# Patient Record
Sex: Female | Born: 1980 | Race: Black or African American | Hispanic: No | Marital: Single | Smoking: Former smoker
Health system: Southern US, Community
[De-identification: ages and names within clinical notes are randomized; demographics above are authoritative.]

## PROBLEM LIST (undated history)

## (undated) DIAGNOSIS — G43909 Migraine, unspecified, not intractable, without status migrainosus: Secondary | ICD-10-CM

## (undated) DIAGNOSIS — G932 Benign intracranial hypertension: Secondary | ICD-10-CM

## (undated) DIAGNOSIS — G510 Bell's palsy: Secondary | ICD-10-CM

## (undated) DIAGNOSIS — E119 Type 2 diabetes mellitus without complications: Secondary | ICD-10-CM

## (undated) DIAGNOSIS — I10 Essential (primary) hypertension: Secondary | ICD-10-CM

## (undated) DIAGNOSIS — R739 Hyperglycemia, unspecified: Secondary | ICD-10-CM

## (undated) DIAGNOSIS — G4730 Sleep apnea, unspecified: Secondary | ICD-10-CM

## (undated) HISTORY — DX: Migraine, unspecified, not intractable, without status migrainosus: G43.909

## (undated) HISTORY — PX: TUBAL LIGATION: SHX77

---

## 2000-09-05 ENCOUNTER — Other Ambulatory Visit: Admission: RE | Admit: 2000-09-05 | Discharge: 2000-09-05 | Payer: Self-pay | Admitting: Obstetrics and Gynecology

## 2000-09-29 ENCOUNTER — Encounter: Payer: Self-pay | Admitting: Obstetrics and Gynecology

## 2000-09-29 ENCOUNTER — Ambulatory Visit (HOSPITAL_COMMUNITY): Admission: RE | Admit: 2000-09-29 | Discharge: 2000-09-29 | Payer: Self-pay | Admitting: Obstetrics and Gynecology

## 2000-11-10 ENCOUNTER — Ambulatory Visit (HOSPITAL_COMMUNITY): Admission: RE | Admit: 2000-11-10 | Discharge: 2000-11-10 | Payer: Self-pay | Admitting: Obstetrics and Gynecology

## 2000-11-10 ENCOUNTER — Encounter: Payer: Self-pay | Admitting: Obstetrics and Gynecology

## 2000-11-14 ENCOUNTER — Emergency Department (HOSPITAL_COMMUNITY): Admission: EM | Admit: 2000-11-14 | Discharge: 2000-11-14 | Payer: Self-pay | Admitting: Emergency Medicine

## 2001-02-24 ENCOUNTER — Inpatient Hospital Stay (HOSPITAL_COMMUNITY): Admission: AD | Admit: 2001-02-24 | Discharge: 2001-02-28 | Payer: Self-pay | Admitting: *Deleted

## 2002-06-08 ENCOUNTER — Emergency Department (HOSPITAL_COMMUNITY): Admission: EM | Admit: 2002-06-08 | Discharge: 2002-06-08 | Payer: Self-pay | Admitting: Emergency Medicine

## 2002-06-08 ENCOUNTER — Encounter: Payer: Self-pay | Admitting: Emergency Medicine

## 2002-11-12 ENCOUNTER — Other Ambulatory Visit: Admission: RE | Admit: 2002-11-12 | Discharge: 2002-11-12 | Payer: Self-pay | Admitting: *Deleted

## 2003-02-11 ENCOUNTER — Encounter: Payer: Self-pay | Admitting: Obstetrics and Gynecology

## 2003-02-11 ENCOUNTER — Ambulatory Visit (HOSPITAL_COMMUNITY): Admission: RE | Admit: 2003-02-11 | Discharge: 2003-02-11 | Payer: Self-pay | Admitting: Obstetrics and Gynecology

## 2003-02-21 ENCOUNTER — Inpatient Hospital Stay (HOSPITAL_COMMUNITY): Admission: AD | Admit: 2003-02-21 | Discharge: 2003-02-22 | Payer: Self-pay | Admitting: Obstetrics and Gynecology

## 2003-03-19 ENCOUNTER — Inpatient Hospital Stay (HOSPITAL_COMMUNITY): Admission: AD | Admit: 2003-03-19 | Discharge: 2003-03-19 | Payer: Self-pay | Admitting: Obstetrics and Gynecology

## 2003-03-20 ENCOUNTER — Inpatient Hospital Stay (HOSPITAL_COMMUNITY): Admission: AD | Admit: 2003-03-20 | Discharge: 2003-03-21 | Payer: Self-pay | Admitting: Obstetrics and Gynecology

## 2003-03-22 ENCOUNTER — Inpatient Hospital Stay (HOSPITAL_COMMUNITY): Admission: AD | Admit: 2003-03-22 | Discharge: 2003-03-22 | Payer: Self-pay | Admitting: Obstetrics and Gynecology

## 2003-05-22 ENCOUNTER — Encounter (INDEPENDENT_AMBULATORY_CARE_PROVIDER_SITE_OTHER): Payer: Self-pay

## 2003-05-22 ENCOUNTER — Inpatient Hospital Stay (HOSPITAL_COMMUNITY): Admission: RE | Admit: 2003-05-22 | Discharge: 2003-05-25 | Payer: Self-pay | Admitting: Obstetrics and Gynecology

## 2003-06-19 ENCOUNTER — Inpatient Hospital Stay (HOSPITAL_COMMUNITY): Admission: AD | Admit: 2003-06-19 | Discharge: 2003-06-19 | Payer: Self-pay | Admitting: Obstetrics and Gynecology

## 2003-06-24 ENCOUNTER — Inpatient Hospital Stay (HOSPITAL_COMMUNITY): Admission: AD | Admit: 2003-06-24 | Discharge: 2003-06-24 | Payer: Self-pay | Admitting: Obstetrics and Gynecology

## 2006-03-02 ENCOUNTER — Emergency Department (HOSPITAL_COMMUNITY): Admission: EM | Admit: 2006-03-02 | Discharge: 2006-03-02 | Payer: Self-pay | Admitting: Emergency Medicine

## 2008-01-01 ENCOUNTER — Emergency Department (HOSPITAL_COMMUNITY): Admission: EM | Admit: 2008-01-01 | Discharge: 2008-01-01 | Payer: Self-pay | Admitting: Emergency Medicine

## 2010-10-22 NOTE — Discharge Summary (Signed)
NAMECRUCITA, [name]                    ACCOUNT NO.:  [id number]   MEDICAL RECORD NO.:  [id number]                   PATIENT TYPE:  INP   LOCATION:  9117                                 FACILITY:  [facility]   PHYSICIAN:  Naima A. Dillard, M.D.              DATE OF BIRTH:  [birth year]   DATE OF ADMISSION:  05/22/2003  DATE OF DISCHARGE:  05/25/2003                                 DISCHARGE SUMMARY   ADMISSION DIAGNOSES:  1. Intrauterine pregnancy  at term  2. Prior cesarean section.  3. Desires repeat cesarean section.  4. Desires sterilization.   DISCHARGE DIAGNOSES:  1. Intrauterine pregnancy  at term.  2. Prior cesarean section.  3. Desires repeat cesarean section.  4. Desires sterilization.   PROCEDURE:  1. Repeat low transverse cesarean section.  2. Bilateral tubal ligation.   HOSPITAL COURSE:  Ms. Linda Thomas is a 30 year old female gravida 2, para 0-1-  0-1 who presents having had a prior cesarean section. She desires a repeat  cesarean section as well as sterilization. Her pregnancy has been followed  by the [facility] and [facility]necologic [facility] Certified Nurse  Midwife Services and has been remarkable for:  1. First trimester bleeding.  2. Previous cesarean section.  3. Preterm delivery at 36 weeks.  4. Planned bilateral tubal ligation.  5. Group B strep positive.  Her cesarean section procedure went well and there was delivery of a viable  female infant weighing 6 pounds and 3 ounces named Linda Thomas. Her Apgar's were  9 at 1 minute and 9 at 5 minutes. She was taken to the full term nursery in  good condition. The patient tolerated the cesarean section as well as the  bilateral tubal ligation well and was taken to recovery room in good  condition. By postoperative day 1, she was doing well.  Her hemoglobin was  9.5 and had been 11.5 preoperatively. She was ambulating, voiding and eating  without difficulty. She was bottle feeding her infant. She had a  temperature  max of 101.3 in the middle of the night after her delivery but that resolved  to a normal level and remained within normal limits. Her other vital signs  were stable. By postoperative day 2, she continued to do well and by  postoperative day 3, she was deemed to have received the full benefit of her  hospital stay and was discharged to home.   DISCHARGE INSTRUCTIONS:  Per [facility] and Gynecologic  Services handout.   DISCHARGE MEDICATIONS:  1. Tylox 1 to 2 p.o. q. 3 to 4 hours p.r.n. pain.  2. Motrin 600 mg 1 p.o. q. 6 hours p.r.n. pain.   FOLLOW UP:  Will occur at [facility] and Gynecologic  Services in approximately 6 weeks or as needed.     Cam Hai, C.N.M.  Naima A. Normand Sloop, M.D.    KS/MEDQ  D:  05/25/2003  T:  05/25/2003  Job:  [id number]

## 2010-10-22 NOTE — Discharge Summary (Signed)
[facility]  Patient:    Linda Thomas, Linda Thomas Visit Number: [id number] MRN: [id number]          Service Type: OBS Location: 910A 9129 01 Attending Physician:  Leonard Schwartz Dictated by:   Mack Guise, C.N.M. Admit Date:  02/24/2001 Discharge Date: 02/28/2001                             Discharge Summary  ADMITTING DIAGNOSES:          1. Intrauterine pregnancy at 36 weeks.                               2. Active labor.                               3. Breech.  PROCEDURE:                    Primary low transverse cesarean section.  DISCHARGE DIAGNOSES:          1. Intrauterine pregnancy at 36 weeks.                               2. Active labor.                               3. Breech.                               4. Postpartum febrile morbidity.  HOSPITAL COURSE:              Ms. Cerveny is a 30 year old, gravida 1, para 0, who presented at 36 weeks in active labor. Upon rupture of membranes, she was found to be breech and underwent primary low transverse cesarean delivery by Dr. Marline Backbone with the birth of a 5-pound 5-ounce female infant named Trevcon with Apgar scores of 6 at one minute, 8 at five minutes. Her postoperative course has been complicated by an increased temperature. Her hemoglobin on the first postoperative day was 11.1. She was treated with Cefotan for that increased temperature and on September 25, she had been afebrile for 24 hours and was judged to be in satisfactory condition for discharge.  DISCHARGE INSTRUCTIONS:       Instructions are per [facility] handout.  DISCHARGE MEDICATIONS:        1. Motrin 600 mg p.o. q.6h. p.r.n. pain.                               2. Tylox one to two p.o. q.3-4h. p.r.n. pain.                               3. Prenatal vitamins.  DISCHARGE FOLLOWUP:           The patient is to follow up at [facility] in six weeks. Dictated by:   Mack Guise, C.N.M. Attending  Physician:  Leonard Schwartz DD:  02/28/01 TD:  02/28/01 Job: [id number]

## 2010-10-22 NOTE — Op Note (Signed)
Linda Thomas, Linda Thomas                    ACCOUNT NO.:  [id number]   MEDICAL RECORD NO.:  [id number]                   PATIENT TYPE:  INP   LOCATION:  9198                                 FACILITY:  [facility]   PHYSICIAN:  Janine Limbo, M.D.            DATE OF BIRTH:  14-Jan-1981   DATE OF PROCEDURE:  05/22/2003  DATE OF DISCHARGE:                                 OPERATIVE REPORT   PREOPERATIVE DIAGNOSES:  1. Term intrauterine pregnancy.  2. Prior cesarean section.  3. Desire repeat cesarean section.  4. Desires sterilization.   POSTOPERATIVE DIAGNOSES:  1. Term intrauterine pregnancy.  2. Prior cesarean section.  3. Desire repeat cesarean section.  4. Desires sterilization.  5. Pelvic adhesions.   PROCEDURES:  1. Repeat low transverse cesarean section.  2. Bilateral tubal ligation.   SURGEON:  Janine Limbo, M.D.   FIRST ASSISTANT:  Marie L. Williams, C.N.M.   ANESTHESIA:  Spinal.   DISPOSITION:  Linda Thomas is a 30 year old female, gravida 2, para 0-1-0-  1, who presents having  had a prior cesarean section.  She desires repeat  cesarean section.  She also desires sterilization.  She understands the  indications for her procedure and she accepts the risks of, but not limited  to, anesthetic complications, bleeding, infections, possible damage to the  surrounding organs, and possible tubal failure (17 per 1000).   FINDINGS:  A 6 pound 3 ounce female infant Science writer) was delivered.  The  Apgars were 9 at one minute and 9 at five minutes.  The fallopian tubes and  the ovaries appeared normal.  There were dense adhesions between the fascia,  the abdominal musculature, the anterior peritoneum, and the lower uterine  segment.   DESCRIPTION OF PROCEDURE:  The patient was taken to the operating room,  where a spinal anesthetic was given.  The patient's abdomen and perineum  were prepped with multiple layers of Betadine.  A Foley catheter was placed  in the  bladder.  The patient was sterilely draped.  The lower abdominal  incision was injected with 20 mL of 0.5% Marcaine with epinephrine.  A low  transverse incision was made and the incision was extended sharply through  the subcutaneous tissue, the fascia, and the anterior peritoneum.  There  were dense adhesions encountered and an extensive lysis of adhesions was  required before we could get to the lower uterine segment.  Additionally,  there were adhesions between the omentum, the peritoneum, and the lower  uterine segment.  These were carefully removed.  The bladder flap was  developed.  An incision was made in the lower uterine segment and extended  transversely.  The fetal head was delivered without difficulty.  The mouth  and nose were suctioned.  The remainder of the infant was delivered.  The  cord was clamped and cut.  The infant was handed to the awaiting pediatric  team.  Routine cord blood studies were obtained.  The placenta was removed.  The uterine cavity was cleaned of amniotic fluid, clotted blood, and  membranes.  The uterine incision was closed using a running locking suture  of 2-0 Vicryl.  Hemostasis was adequate.  The pelvis was vigorously  irrigated.  The left fallopian tube was identified and followed to its  fimbriated end.  A knuckle of tube was made on the left using a free tie and  then a suture ligature of 0 plain catgut.  The knuckle of tube thus made was  excised.  Hemostasis was adequate.  An identical procedure was carried out  on the opposite side.  Again hemostasis was adequate.  The pelvis was  irrigated once again.  The anterior peritoneum and the abdominal musculature  were reapproximated in the midline using 2-0 Vicryl.  The fascia was closed  using a running suture of 0 Vicryl followed by three interrupted sutures of  0 Vicryl.  The subcutaneous layer was once again irrigated.  Hemostasis was  adequate.  The subcutaneous layer was closed using a running  suture of 2-0  Vicryl.  The skin was reapproximated using a subcuticular suture of 4-0  Vicryl.  Sponge, needle, and instrument counts were correct on two  occasions.  The estimated blood loss was 800 mL.  The patient had tolerated  her procedure well.  The patient was taken to the recovery room in stable  condition.  She was noted to drain clear yellow urine.  The infant was taken  to the full-term nursery in stable condition.                                               Janine Limbo, M.D.    AVS/MEDQ  D:  05/22/2003  T:  05/22/2003  Job:  [id number]

## 2010-10-22 NOTE — H&P (Signed)
NAMEMarland Kitchen  Thomas, Linda                    ACCOUNT NO.:  [id number]   MEDICAL RECORD NO.:  [id number]                   PATIENT TYPE:  INP   LOCATION:  9151                                 [facility]ACILITY[facility]   PHYSICIAN:  Hal Morales, M.D.             DATE OF BIRTH:  April 05, 1981   DATE OF ADMISSION:  03/20/2003  DATE OF DISCHARGE:                                HISTORY & PHYSICAL   Linda Thomas is a 30 year old gravida 1, para 0-1-0-1, at 28-3/7 weeks by  six-week ultrasound, who presented to the hospital for re-evaluation  secondary to a positive fetal fibronectin and a positive Chlamydia from a  visit on March 19, 2003.  Linda Thomas was seen in the office yesterday  for a small amount of bleeding.  Her cervix was noted to be external os 2  cm, internal os fingertip.  She was seen at maternity admissions after  having cultures done and a beta strep test, and she was sent home after a  period of monitoring.  Fetal fibronectin and cultures had been done at the  office.  The patient was notified today that her fetal fibronectin and  Chlamydia were positive.  She was therefore directed to come back to  maternity admissions for betamethasone and further re-evaluation.  Upon  presentation in maternity admissions, the patient was noted to have mild  irritability and some occasional contractions.  The decision was made to  admit her for 23-hour observation to complete her betamethasone course and  continuous monitoring.  Pregnancy is remarkable for:   1. Previous primary low transverse cesarean section secondary to labor at 36     weeks with a breech presentation.  2. Desires repeat C-section and tubal ligation.  3. First trimester bleeding.  4. Positive fetal fibronectin now.  5. Positive Chlamydia now.    PRENATAL LABORATORY DATA:  Blood type is B positive, Rh antibody negative.  VDRL nonreactive.  Rubella titer positive.  Hepatitis B surface antigen  negative.  HIV  nonreactive.  Sickle cell test negative.  GC and Chlamydia  cultures from May were noted in the chart.  Chlamydia was noted to be  positive, although the patient did not receive treatment.  Cystic fibrosis  testing was negative.  Hemoglobin upon entering the practice was 11.8.  At  27 weeks it was 10.7.  Glucola was normal.  EDC of June 07, 2003, was  established by six-week ultrasound and was in agreement with ultrasound at  approximately 24 weeks.  Cervix at 24 weeks was 3.4 cm long.   HISTORY OF PRESENT PREGNANCY:  The patient entered care at approximately 10  weeks.  She had an ultrasound at 10 weeks to show positive cardiac activity,  and the cervix was 3.2 cm.  Her cervix was closed at 12 weeks.  She was  planning to have her tubes tied, and papers were signed in July.  She had  another ultrasound at  19 weeks with an echogenic intracardiac focus noted.  This was followed up at 23 weeks with the same finding but normal growth and  development.  The patient was placed on iron at 27 weeks.  She reported  bright red bleeding the morning of March 19, 2003, and was seen at the  office where cultures were done, cervix was fingertip internal os, 2 cm  external os, 50%, with the presenting part at a -2.  The patient's blood  type was B positive.  She was sent to maternity admissions on that day for  monitoring and tocolysis p.r.n., but she was not given any at that time.   PAST OBSTETRICAL HISTORY:  In 2002 the patient had a primary low transverse  cesarean section of a female infant, weight 5 pounds 5 ounces, at 36 weeks.  She did go into labor at that time and was found to be breech.  She had no  other pregnancy complications.  That infant did well.   PAST MEDICAL HISTORY:  She had Chlamydia in 2002.   The patient has no known medication allergies.   FAMILY HISTORY:  Her maternal grandfather has heart disease and the maternal  grandmother, maternal grandfather, and mother have  hypertension.  Her sister  has anemia.  Her maternal grandfather has diabetes.  Paternal grandmother  has a stroke.  Someone in her family had rheumatoid arthritis.  Mother,  father, and brother are tobacco users.   GENETIC HISTORY:  Remarkable for the patient's sister and her brother having  sickle cell trait.   SOCIAL HISTORY:  The patient is single.  Father of the baby is involved and  supportive.  The patient denies any alcohol, drug, or tobacco use during  this pregnancy.  The patient has been followed by the certified nurse  midwife service at [facility].   PHYSICAL EXAMINATION:  VITAL SIGNS:  Stable, the patient is afebrile.  HEENT:  Within normal limits.  CHEST:  Bilateral breath sounds are clear.  CARDIAC:  Regular rate and rhythm without murmur.  BREASTS:  Soft and nontender.  ABDOMEN:  Fundal height is approximately 28 cm.  Fetus is vertex to bedside  ultrasound.  PELVIC:  Cervix is external os 2 cm, internal os fingertip, 50%, with the  presenting part out of the pelvis on digital exam.  MONITORING:  Fetal heart rate is reactive.  In the initial two hours of  monitor tracing, the patient had some occasional irritability and two or  three contractions in that amount of time.  EXTREMITIES:  Deep tendon reflexes are 2+ without clonus.  There is a trace  edema noted.   IMPRESSION:  1. Intrauterine pregnancy at 28-3/7 weeks.  2. Positive fetal fibronectin.  3. Positive Chlamydia.   PLAN:  1. Admit to antenatal per consult with Hal Morales, M.D., as attending     physician.  2. Continuous electronic fetal monitoring.  3. Complete betamethasone course with first dose given at approximately 1     p.m. on today and a second dose to be given at approximately 1 p.m. on     October 15.  4. The patient also received Zithromax 1 g for treatment of Chlamydia.  Will     plan test of cure in three weeks. 5. Terbutaline or other tocolysis for more than six  contractions in an hour.     Renaldo Reel Linda Thomas, C.N.M.  Hal Morales, M.D.    VLL/MEDQ  D:  03/20/2003  T:  03/20/2003  Job:  [id number]

## 2010-10-22 NOTE — Discharge Summary (Signed)
NAMEJANAYSIA, Linda Thomas                    ACCOUNT NO.:  [id number]   MEDICAL RECORD NO.:  [id number]                   PATIENT TYPE:  INP   LOCATION:  9151                                 [facility]ILITY[facility]   PHYSICIAN:  Hal Morales, M.D.             DATE OF BIRTH:  07-Nov-1980   DATE OF ADMISSION:  03/20/2003  DATE OF DISCHARGE:  03/21/2003                                 DISCHARGE SUMMARY   ADMISSION DIAGNOSES:  1. Intrauterine pregnancy at 56 and 3/7ths weeks.  2. Positive Chlamydia.  3. Positive fetal fibronectin.  4. Slight cervical change.   DISCHARGE DIAGNOSIS:  She is leaving the hospital against medical advice.   PROCEDURES THIS ADMISSION:  None.   HOSPITAL COURSE:  [name] is a 30 year old single black female,  gravida 2, para 0-1-0-1 at 66 and 3/7ths weeks who presented to maternity  admissions on March 20, 2003 for betamethasone treatment secondary to  having a documented positive fetal fibronectin on March 19, 2003, and also  being positive for Chlamydia from exam on March 19, 2003.  She did receive  a dose of betamethasone at approximately 1 p.m. on March 20, 2003, and  again on March 21, 2003.  She received 1 gm of Zithromax p.o. on March 20, 2003.  She has not been noted to have any regular uterine contractions  on the monitor, but does report a sense of pressure and some low backache.  She was recommended at this point with the afternoon of March 21, 2003 it  was noted that her cervix had changed from fingertip internal os to 1.5 to 2  cm internal os, 50% vertex, at a -2.  Due to this cervical change with just  bed rest for treatment, it was recommended that she continue on straight bed  rest in the hospital and take ibuprofen 600 mg q.6h. around the clock.  When  the patient was informed of the recommendation that she be continued in the  hospital, and that medication be started, she stated that she wanted to sign  out of the  hospital against medical advice.  The risks of preterm delivery  were reviewed with her again; however, she verbalized that she understands  and accepts the risk of preterm delivery, but wants to leave anyway, and  that she will continue on bed rest at home and take Motrin 600 mg q.6h. at  home.  She also reports that she will return for a cervical exam tomorrow in  maternity admissions.   DISCHARGE INSTRUCTIONS:  1. Continue on straight bed rest; be up only for bathroom and eating.  2. Take ibuprofen 600 mg p.o. q.6h. around the clock.  3. Return for a cervical exam in maternity admissions tomorrow.     [name], C.N.M.              Hal Morales, M.D.    SJD/MEDQ  D:  03/21/2003  T:  03/21/2003  Job:  [id number]

## 2010-10-22 NOTE — Op Note (Signed)
[facility]  Patient:    Linda Thomas, Linda Thomas Visit Number: [id number] MRN: [id number]          Service Type: OBS Location: 910A 9129 01 Attending Physician:  Leonard Schwartz Dictated by:   Janine Limbo, M.D. Proc. Date: 02/24/01 Admit Date:  02/24/2001                             Operative Report  PREOPERATIVE DIAGNOSES:       1. Thirty-six weeks gestation.                               2. Breach presentation  POSTOPERATIVE DIAGNOSES:      1. Thirty-six weeks gestation.                               2. Homero Fellers breach presentation.  OPERATION:                    Primary low transverse cesarean section.  SURGEON:                      Janine Limbo, M.D.  FIRST ASSISTANT:              Wynelle Bourgeois, CNM  ANESTHESIA:                   Spinal.  DISPOSITION:                  The patient is a 30 year old female, gravida 1, para 0, who presents at [redacted] weeks gestation in active labor.  She was noted to be in a breach presentation.  The patient understands the indications for her procedure, and she accepts the risks of, but not limited to, anesthetic complications, bleeding, infections, and possible damage to the surrounding organs.  FINDINGS:                     A 5 pound 5 ounce female infant Robina Ade) was delivered from a frank breach presentation.  The Apgars were 6 at 1 minute and 8 at 5 minutes.  The uterus, fallopian tubes, and ovaries were normal for the gravid state.  DESCRIPTION OF PROCEDURE:     The patient was taken to the operating room where a spinal anesthetic was given.  The patients abdomen and perineum were prepped with multiple layers with Betadine.  A Foley catheter was placed in the bladder.  The patient was sterilely draped.  A low transverse incision was made in the abdomen and carried sharply through the subcutaneous tissue, the fascia, and the anterior peritoneum.  An incision was made in the lower uterine  segment and extended transversely.  The infant was delivered from a frank breach presentation without difficulty.  The cord was clamped and cut, and the infant was handed to the awaiting pediatric team.  Routine cord blood studies were obtained.  The placenta was removed.  The uterine  cavity was cleaned of amniotic fluid, clotted blood, and membranes.  The uterine incision was closed using a running locking suture of 2-0 Vicryl followed by figure-of-eight sutures of 2-0 Vicryl for hemostasis.  Hemostasis was noted to be adequate.  The pelvic cavity was vigorously irrigated.  The The anterior peritoneum and the abdominal musculature were reapproximated in  the midline using 2-0 Vicryl.  The fascia and the abdominal musculature were irrigated. Hemostasis was adequate.  The fascia was closed using a running suture of 0 Vicryl followed by three interrupted sutures of 0 Vicryl.  The subcutaneous layer was closed using 2-0 Vicryl.  The skin was reapproximated using skin staples.  Sponge, needle, and instrument counts were correct on two occasions. The estimated blood loss was 700 cc.  The patient tolerated her procedure well.   The patient was taken to the recovery room in stable condition. The infant was taken to the full-term nursery in stable condition. Dictated by:   Janine Limbo, M.D. Attending Physician:  Leonard Schwartz DD:  02/24/01 TD:  02/24/01 Job: [id number]

## 2010-10-22 NOTE — H&P (Signed)
[facility]  Patient:    Linda Thomas, Linda Thomas Visit Number: [id number] MRN: [id number]          Service Type: EMS Location: ED Attending Physician:  Donnetta Hutching Dictated by:   Wynelle Bourgeois, CNM Admit Date:  11/14/2000 Discharge Date: 11/14/2000                           History and Physical  HISTORY OF PRESENT ILLNESS:   This is a 30 year old G1, P0 at 36-0/7ths weeks, who presents with complaints of regular uterine contractions x 2 hours.  She reports a small amount of bleeding and positive fetal movement.  Her pregnancy has been followed by the nurse midwifery service and remarkable for: 1. First trimester bleeding.  2. Right ovarian cyst.  3. Rubella nonimmune. 4. Shortened cervix.  OBSTETRICAL HISTORY:          The patient is a primigravida.  PRENATAL LABORATORIES:        Hemoglobin 11.7, hematocrit 35.9, platelets 161. Blood type B positive.  Antibody screen negative.  Sickle cell negative.  RPR nonreactive.  Rubella nonimmune.  HBsAg negative.  HIV declined.  Pap test normal.  Gonorrhea negative.  Chlamydia initially positive with a negative test of _____.  Glucose challenge within normal limits.  Group B strep pending.  MEDICAL HISTORY:              Remarkable for a history of childhood varicella.  FAMILY HISTORY:               Remarkable for anemia in her sister; rheumatoid arthritis in her father; migraines in her sister; hypertension in her mother, grandmother and grandfather.  GENETIC HISTORY:              Remarkable for a sister and a brother with sickle cell trait.  SOCIAL HISTORY:               The patient is single.  The father of the baby is not involved.  She does not report a religious affiliation.  Denies any alcohol, tobacco or drug use.  PHYSICAL EXAMINATION:  VITAL SIGNS:                  Vital signs stable, afebrile.  HEENT:                        Within normal limits.  NECK:                         Thyroid  normal, not enlarged.  BREASTS:                      Soft, nontender, no masses.  CHEST:                        Clear to auscultation bilaterally.  HEART RATE:                   Regular rate and rhythm, no murmur.  ABDOMEN:                      Gravid, vertex to [employer].  EFM shows reactive fetal heart rate with uterine contractions every 2-3 minutes.  PELVIC:  Cervical exam is 7-8 cm, completely effaced, -1 station vertex with a bulging bag of water.  Positive bloody show.  EXTREMITIES:                  Within normal limits.  ASSESSMENT:                   1. Intrauterine pregnancy at 36 weeks.                               2. Active labor.  PLAN:                         1. Admit to birthing suite, Dr. Stefano Gaul                                  notified.                               2. Routine CNM orders.                               3. Group B strep prophylaxis. Dictated by:   Wynelle Bourgeois, CNM Attending Physician:  Donnetta Hutching DD:  02/24/01 TD:  02/24/01 Job: [id number]

## 2010-10-22 NOTE — H&P (Signed)
NAMEMarland Kitchen  BONNE, WHACK                    ACCOUNT NO.:  [id number]   MEDICAL RECORD NO.:  [id number]                   PATIENT TYPE:  INP   LOCATION:  NA                                   FACILITY:  [facility]   PHYSICIAN:  Janine Limbo, M.D.            DATE OF BIRTH:  11-05-1980   DATE OF ADMISSION:  DATE OF DISCHARGE:                                HISTORY & PHYSICAL   DATE OF SURGERY:  May 22, 2003   HISTORY OF PRESENT ILLNESS:  Ms. Fuchs is a 30 year old female gravida  2 para 0-1-0-1 who presents at [redacted] weeks gestation (EDC is June 07, 2003)  for repeat cesarean section and tubal ligation.  The patient has been  followed at the [facility] and Gy[facility]cology division of  [facility] for this pregnancy.  Her pregnancy has been  complicated by a positive beta strep culture.  In addition, the patient had  a positive chlamydia test in the first trimester.  She was appropriately  treated.  Her repeat chlamydia test was negative.   OBSTETRICAL HISTORY:  The patient had a cesarean delivery at [redacted] weeks  gestation in 2002 where she delivered a breech infant.  She labored  spontaneously.   PAST MEDICAL HISTORY:  The patient denies hypertension and diabetes.   DRUG ALLERGIES:  No known drug allergies.   SOCIAL HISTORY:  The patient denies cigarette use, alcohol use, and  recreational drug use.   REVIEW OF SYSTEMS:  Normal pregnancy complaints.   FAMILY HISTORY:  Noncontributory.   PHYSICAL EXAMINATION:  VITAL SIGNS:  Height is 5 feet 2 inches, weight is  194 pounds.  HEENT:  Within normal limits.  CHEST:  Clear.  HEART:  Regular rate and rhythm.  BREASTS:  Without masses.  ABDOMEN:  Gravid with a fundal height of 36 cm.  EXTREMITIES:  Within normal limits.  NEUROLOGIC:  Grossly normal.  PELVIC:  Cervix was 2 cm, 50%, and -3.   LABORATORY VALUES:  Blood type is B positive, antibody screen negative.  Sickle cell negative.  VDRL  nonreactive.  Rubella immune.  HbsAg negative.  HIV nonreactive.  Cystic fibrosis negative.  Third trimester beta strep is  positive.  Third trimester gonorrhea negative, third trimester chlamydia  negative.   ASSESSMENT:  1. Thirty-two weeks gestation.  2. Prior cesarean section.  3. Desires repeat cesarean section.  4. Desires sterilization.   PLAN:  The patient will undergo a repeat low transverse cesarean section and  bilateral tubal ligation.  She understands the indications for her procedure  and she accepts the risks of, but not limited to, anesthetic complications,  bleeding, infections, and possible damage to the surrounding organs.  She  understands that there is a small but real failure rate associated with  tubal ligation (17 per 1000).  Janine Limbo, M.D.    AVS/MEDQ  D:  05/20/2003  T:  05/20/2003  Job:  [id number]

## 2013-06-14 ENCOUNTER — Encounter (HOSPITAL_COMMUNITY): Payer: Self-pay | Admitting: Emergency Medicine

## 2013-06-14 ENCOUNTER — Emergency Department (HOSPITAL_COMMUNITY)
Admission: EM | Admit: 2013-06-14 | Discharge: 2013-06-14 | Disposition: A | Discharge status: Home / Self Care | Payer: Self-pay | Attending: Emergency Medicine | Admitting: Emergency Medicine

## 2013-06-14 DIAGNOSIS — Z87891 Personal history of nicotine dependence: Secondary | ICD-10-CM | POA: Insufficient documentation

## 2013-06-14 DIAGNOSIS — R21 Rash and other nonspecific skin eruption: Secondary | ICD-10-CM

## 2013-06-14 DIAGNOSIS — L253 Unspecified contact dermatitis due to other chemical products: Secondary | ICD-10-CM | POA: Insufficient documentation

## 2013-06-14 DIAGNOSIS — IMO0001 Reserved for inherently not codable concepts without codable children: Secondary | ICD-10-CM

## 2013-06-14 DIAGNOSIS — T7840XA Allergy, unspecified, initial encounter: Secondary | ICD-10-CM

## 2013-06-14 NOTE — ED Notes (Signed)
Pt reports a rash, redness and tightness to her face after using a new face wash on Saturday. Pt reports using Neutrogena with acid in it. Airway is intact, no swelling noted to her eyes

## 2013-06-14 NOTE — Discharge Instructions (Signed)
Avoid benzoil peroxide or salicylic acid in the future. Do not use soap for the next week on your face. Apply Vaseline or try Aquaphor healing ointment. Follow up with your doctor as needed.    Contact Dermatitis Contact dermatitis is a reaction to certain substances that touch the skin. Contact dermatitis can be either irritant contact dermatitis or allergic contact dermatitis. Irritant contact dermatitis does not require previous exposure to the substance for a reaction to occur.Allergic contact dermatitis only occurs if you have been exposed to the substance before. Upon a repeat exposure, your body reacts to the substance.  CAUSES  Many substances can cause contact dermatitis. Irritant dermatitis is most commonly caused by repeated exposure to mildly irritating substances, such as:  Makeup.  Soaps.  Detergents.  Bleaches.  Acids.  Metal salts, such as nickel. Allergic contact dermatitis is most commonly caused by exposure to:  Poisonous plants.  Chemicals (deodorants, shampoos).  Jewelry.  Latex.  Neomycin in triple antibiotic cream.  Preservatives in products, including clothing. SYMPTOMS  The area of skin that is exposed may develop:  Dryness or flaking.  Redness.  Cracks.  Itching.  Pain or a burning sensation.  Blisters. With allergic contact dermatitis, there may also be swelling in areas such as the eyelids, mouth, or genitals.  DIAGNOSIS  Your caregiver can usually tell what the problem is by doing a physical exam. In cases where the cause is uncertain and an allergic contact dermatitis is suspected, a patch skin test may be performed to help determine the cause of your dermatitis. TREATMENT Treatment includes protecting the skin from further contact with the irritating substance by avoiding that substance if possible. Barrier creams, powders, and gloves may be helpful. Your caregiver may also recommend:  Steroid creams or ointments applied 2 times  daily. For best results, soak the rash area in cool water for 20 minutes. Then apply the medicine. Cover the area with a plastic wrap. You can store the steroid cream in the refrigerator for a "chilly" effect on your rash. That may decrease itching. Oral steroid medicines may be needed in more severe cases.  Antibiotics or antibacterial ointments if a skin infection is present.  Antihistamine lotion or an antihistamine taken by mouth to ease itching.  Lubricants to keep moisture in your skin.  Burow's solution to reduce redness and soreness or to dry a weeping rash. Mix one packet or tablet of solution in 2 cups cool water. Dip a clean washcloth in the mixture, wring it out a bit, and put it on the affected area. Leave the cloth in place for 30 minutes. Do this as often as possible throughout the day.  Taking several cornstarch or baking soda baths daily if the area is too large to cover with a washcloth. Harsh chemicals, such as alkalis or acids, can cause skin damage that is like a burn. You should flush your skin for 15 to 20 minutes with cold water after such an exposure. You should also seek immediate medical care after exposure. Bandages (dressings), antibiotics, and pain medicine may be needed for severely irritated skin.  HOME CARE INSTRUCTIONS  Avoid the substance that caused your reaction.  Keep the area of skin that is affected away from hot water, soap, sunlight, chemicals, acidic substances, or anything else that would irritate your skin.  Do not scratch the rash. Scratching may cause the rash to become infected.  You may take cool baths to help stop the itching.  Only take over-the-counter or  prescription medicines as directed by your caregiver.  See your caregiver for follow-up care as directed to make sure your skin is healing properly. SEEK MEDICAL CARE IF:   Your condition is not better after 3 days of treatment.  You seem to be getting worse.  You see signs of  infection such as swelling, tenderness, redness, soreness, or warmth in the affected area.  You have any problems related to your medicines. Document Released: 05/20/2000 Document Revised: 08/15/2011 Document Reviewed: 10/26/2010 [facility] Patient Information 2014 [place], [place].

## 2013-06-14 NOTE — ED Provider Notes (Signed)
CSN: [id number]     Arrival date & time 06/14/13  1252 History  This chart was scribed for non-physician practitioner, Lahoma Rocker A Clotilda Hafer. PA-C, working with Orpah Greek, MD by Roe Coombs, ED Scribe. This patient was seen in room TR08C/TR08C and the patient's care was started at 1:31 PM.    Chief Complaint  Patient presents with  . Rash    The history is provided by the patient. No language interpreter was used.    HPI Comments: Linda Thomas is a 33 y.o. female who presents to the Emergency Department complaining of an erythematous, burning, itchy rash to her face onset 6 days ago. She states that she used Neutrogena face wash last Saturday and developed a rash after use. Products had salicylic acid and benzoil peroxide. She has only been using water to wash her face since then. She has tried using moisturizers without relief from symptoms. She hasn't taken any other medications. She denies fever or any other symptoms. She has no chronic medical conditions.    History reviewed. No pertinent past medical history. Past Surgical History  Procedure Laterality Date  . Cesarean section    . Tubal ligation     History reviewed. No pertinent family history. History  Substance Use Topics  . Smoking status: Former Research scientist (life sciences)  . Smokeless tobacco: Not on file  . Alcohol Use: Yes     Comment: social   OB History   Grav Para Term Preterm Abortions TAB SAB Ect Mult Living                 Review of Systems  Constitutional: Negative for fever.  Skin: Positive for rash.  All other systems reviewed and are negative.    Allergies  Review of patient's allergies indicates no known allergies.  Home Medications  No current outpatient prescriptions on file. Triage Vitals: BP 159/93  Pulse 88  Temp(Src) 97.8 F (36.6 C) (Oral)  Resp 16  Ht 5\' 3"  (1.6 m)  Wt 200 lb (90.719 kg)  BMI 35.44 kg/m2  SpO2 99%  LMP 06/01/2013 Physical Exam  Nursing note and vitals  reviewed. Constitutional: She is oriented to person, place, and time. She appears well-developed and well-nourished. No distress.  HENT:  Head: Normocephalic and atraumatic.  Eyes: EOM are normal.  Neck: Neck supple. No tracheal deviation present.  Cardiovascular: Normal rate.   Pulmonary/Chest: Effort normal. No respiratory distress.  Musculoskeletal: Normal range of motion.  Neurological: She is alert and oriented to person, place, and time.  Skin: Skin is warm and dry.  Fine diffuse erythematous rash, dry, scaly, over entire face. No other rash anywhere on the body  Psychiatric: She has a normal mood and affect. Her behavior is normal.    ED Course  Procedures (including critical care time) DIAGNOSTIC STUDIES: Oxygen Saturation is 99% on room air, normal by my interpretation.    COORDINATION OF CARE: 1:36 PM- Patient informed of current plan for treatment and evaluation and agrees with plan at this time.     MDM   1. Chemical sensitivity, initial encounter   2. Rash of face     Patient with a   dry scaly rash to entire face after using some Neutrogena products with chemicals. Her skin is mildly erythematous with scaly fine papular rash. I suspect she is having a contact dermatitis reaction and possible mild chemical burn to the scan from that product she used. Advised her not to use anymore products in her face except  for Vaseline or Aquaphor healing ointment. Benadryl for itching as needed. Followup as needed  Filed Vitals:   06/14/13 1309  BP: 159/93  Pulse: 88  Temp: 97.8 F (36.6 C)  TempSrc: Oral  Resp: 16  Height: 5\' 3"  (1.6 m)  Weight: 200 lb (90.719 kg)  SpO2: 99%    I personally performed the services described in this documentation, which was scribed in my presence. The recorded information has been reviewed and is accurate.    Renold Genta, PA-C 06/14/13 1354  Renold Genta, PA-C 06/14/13 1355

## 2013-06-18 NOTE — ED Provider Notes (Signed)
Medical screening examination/treatment/procedure(s) were performed by non-physician practitioner and as supervising physician I was immediately available for consultation/collaboration.  Orpah Greek, MD 06/18/13 1128

## 2014-07-12 ENCOUNTER — Emergency Department (HOSPITAL_COMMUNITY)
Admission: EM | Admit: 2014-07-12 | Discharge: 2014-07-12 | Disposition: A | Discharge status: Home / Self Care | Payer: No Typology Code available for payment source | Attending: Emergency Medicine | Admitting: Emergency Medicine

## 2014-07-12 ENCOUNTER — Encounter (HOSPITAL_COMMUNITY): Payer: Self-pay | Admitting: Emergency Medicine

## 2014-07-12 DIAGNOSIS — I10 Essential (primary) hypertension: Secondary | ICD-10-CM | POA: Insufficient documentation

## 2014-07-12 DIAGNOSIS — Z3202 Encounter for pregnancy test, result negative: Secondary | ICD-10-CM | POA: Insufficient documentation

## 2014-07-12 DIAGNOSIS — D649 Anemia, unspecified: Secondary | ICD-10-CM | POA: Insufficient documentation

## 2014-07-12 DIAGNOSIS — R42 Dizziness and giddiness: Secondary | ICD-10-CM | POA: Diagnosis present

## 2014-07-12 DIAGNOSIS — Z87891 Personal history of nicotine dependence: Secondary | ICD-10-CM | POA: Diagnosis not present

## 2014-07-12 DIAGNOSIS — R000 Tachycardia, unspecified: Secondary | ICD-10-CM | POA: Insufficient documentation

## 2014-07-12 HISTORY — DX: Essential (primary) hypertension: I10

## 2014-07-12 LAB — CBC WITH DIFFERENTIAL/PLATELET
BASOS PCT: 0 % (ref 0–1)
Basophils Absolute: 0 10*3/uL (ref 0.0–0.1)
Eosinophils Absolute: 0.1 10*3/uL (ref 0.0–0.7)
Eosinophils Relative: 2 % (ref 0–5)
HCT: 32 % — ABNORMAL LOW (ref 36.0–46.0)
HEMOGLOBIN: 10.1 g/dL — AB (ref 12.0–15.0)
LYMPHS ABS: 2.5 10*3/uL (ref 0.7–4.0)
LYMPHS PCT: 40 % (ref 12–46)
MCH: 24.7 pg — ABNORMAL LOW (ref 26.0–34.0)
MCHC: 31.6 g/dL (ref 30.0–36.0)
MCV: 78.2 fL (ref 78.0–100.0)
Monocytes Absolute: 0.5 10*3/uL (ref 0.1–1.0)
Monocytes Relative: 7 % (ref 3–12)
Neutro Abs: 3.3 10*3/uL (ref 1.7–7.7)
Neutrophils Relative %: 51 % (ref 43–77)
Platelets: 225 10*3/uL (ref 150–400)
RBC: 4.09 MIL/uL (ref 3.87–5.11)
RDW: 15.2 % (ref 11.5–15.5)
WBC: 6.4 10*3/uL (ref 4.0–10.5)

## 2014-07-12 LAB — BASIC METABOLIC PANEL
Anion gap: 7 (ref 5–15)
BUN: 12 mg/dL (ref 6–23)
CHLORIDE: 104 mmol/L (ref 96–112)
CO2: 27 mmol/L (ref 19–32)
Calcium: 8.8 mg/dL (ref 8.4–10.5)
Creatinine, Ser: 0.7 mg/dL (ref 0.50–1.10)
GFR calc Af Amer: >90 mL/min (ref >90)
GFR calc non Af Amer: >90 mL/min (ref >90)
Glucose, Bld: 147 mg/dL — ABNORMAL HIGH (ref 70–99)
POTASSIUM: 3.4 mmol/L — AB (ref 3.5–5.1)
Sodium: 138 mmol/L (ref 135–145)

## 2014-07-12 LAB — POC URINE PREG, ED: PREG TEST UR: NEGATIVE

## 2014-07-12 LAB — POC OCCULT BLOOD, ED: FECAL OCCULT BLD: NEGATIVE

## 2014-07-12 MED ORDER — FERROUS SULFATE 325 (65 FE) MG PO TABS: 325.0000 mg | ORAL_TABLET | Freq: Every day | ORAL | Status: DC

## 2014-07-12 MED ORDER — MECLIZINE HCL 25 MG PO TABS
25.0000 mg | ORAL_TABLET | Freq: Once | ORAL | Status: AC
  Administered 2014-07-12: 25 mg via ORAL
  Filled 2014-07-12: qty 1

## 2014-07-12 MED ORDER — IBUPROFEN 800 MG PO TABS
800.0000 mg | ORAL_TABLET | Freq: Once | ORAL | Status: AC
  Administered 2014-07-12: 800 mg via ORAL
  Filled 2014-07-12: qty 1

## 2014-07-12 MED ORDER — SODIUM CHLORIDE 0.9 % IV BOLUS (SEPSIS)
1000.0000 mL | Freq: Once | INTRAVENOUS | Status: AC
  Administered 2014-07-12: 1000 mL via INTRAVENOUS

## 2014-07-12 NOTE — ED Notes (Signed)
Pt c/o dizziness x couple weeks. Pt reports symptoms increase when being upright and decrease when laying down.

## 2014-07-12 NOTE — ED Provider Notes (Signed)
CSN: [id number]     Arrival date & time 07/12/14  1314 History   First MD Initiated Contact with Patient 07/12/14 1456     Chief Complaint  Patient presents with  . Dizziness     (Consider location/radiation/quality/duration/timing/severity/associated sxs/prior Treatment) HPI Comments: Patient presents today with a chief complaint of dizziness.  She describes the dizziness as feeling lightheaded.  She states that her symptoms have been present for the past couple of weeks and have been persistent.  Dizziness worsens when she stands up.  She denies syncope, but reports that she has felt like she was going to pass out.  She has not taken anything for her symptoms.  Denies headache, vision changes, nausea, vomiting, numbness, tingling, weakness, abdominal pain, chest pain, or SOB.  She denies vaginal bleeding or rectal bleeding at this time.  She does report that she does have heavy menstrual periods.  She reports that her menstrual cycle is typically seven days long and that she has very heavy bleeding for the first four days.  LMP was 06/24/14.  No history of DM.  No history of heart arrhythmia.  No known history of Anemia.    Patient is a 34 y.o. female presenting with dizziness. The history is provided by the patient.  Dizziness   Past Medical History  Diagnosis Date  . Hypertension during pregnancy   Past Surgical History  Procedure Laterality Date  . Cesarean section    . Tubal ligation     No family history on file. History  Substance Use Topics  . Smoking status: Former Research scientist (life sciences)  . Smokeless tobacco: Not on file  . Alcohol Use: Yes     Comment: social   OB History    No data available     Review of Systems  Neurological: Positive for dizziness.  All other systems reviewed and are negative.     Allergies  Other  Home Medications   Prior to Admission medications   Medication Sig Start Date End Date Taking? Authorizing Provider  acetaminophen (TYLENOL) 500 MG tablet  Take 500 mg by mouth every 6 (six) hours as needed for moderate pain.   Yes Historical Provider, MD  ibuprofen (ADVIL,MOTRIN) 200 MG tablet Take 200 mg by mouth every 6 (six) hours as needed for moderate pain.   Yes Historical Provider, MD   BP 130/58 mmHg  Pulse 94  Temp(Src) 98.3 F (36.8 C) (Oral)  Resp 21  Ht 5\' 3"  (1.6 m)  Wt 205 lb (92.987 kg)  BMI 36.32 kg/m2  SpO2 99%  LMP 06/24/2014 (Approximate) Physical Exam  Constitutional: She appears well-developed and well-nourished. No distress.  HENT:  Head: Normocephalic and atraumatic.  Mouth/Throat: Oropharynx is clear and moist.  Eyes: EOM are normal. Pupils are equal, round, and reactive to light.  Neck: Normal range of motion. Neck supple.  Cardiovascular: Normal rate, regular rhythm and normal heart sounds.   Tachycardia when standing  Pulmonary/Chest: Effort normal and breath sounds normal.  Abdominal: Soft. Bowel sounds are normal. She exhibits no distension and no mass. There is no tenderness. There is no rebound and no guarding.  Musculoskeletal: Normal range of motion.  Neurological: She is alert. She has normal strength. No cranial nerve deficit or sensory deficit. Coordination and gait normal.  Normal gait, no ataxia Normal finger to nose testing Normal rapid alternating movements  Skin: Skin is warm and dry. She is not diaphoretic.  Psychiatric: She has a normal mood and affect.  Nursing note and vitals  reviewed.   ED Course  Procedures (including critical care time) Labs Review Labs Reviewed  CBC WITH DIFFERENTIAL/PLATELET - Abnormal; Notable for the following:    Hemoglobin 10.1 (*)    HCT 32.0 (*)    MCH 24.7 (*)    All other components within normal limits  BASIC METABOLIC PANEL - Abnormal; Notable for the following:    Potassium 3.4 (*)    Glucose, Bld 147 (*)    All other components within normal limits  POC URINE PREG, ED  POC OCCULT BLOOD, ED    Imaging Review No results found.   EKG  Interpretation   Date/Time:  Saturday July 12 2014 15:49:59 EST Ventricular Rate:  78 PR Interval:  142 QRS Duration: 96 QT Interval:  381 QTC Calculation: 434 R Axis:   46 Text Interpretation:  Sinus rhythm Normal ECG Confirmed by Ashok Cordia  MD,  Lennette Bihari ([id number]) on 07/12/2014 3:55:43 PM        5:04 PM Patient reports that dizziness has improved somewhat.   MDM   Final diagnoses:  None   Patient presents today with dizziness that has been present for a couple of weeks.  Dizziness worse with standing.  Orthostatic vital signs were performed, which showed that the patient did become tachycardic with standing but blood pressure remained unchanged.  Labs today showing microcytic hypochromic anemia with a Hemoglobin of 10.1.  She reports heavy menses and suspect this is the cause of the anemia.  Hemoccult is negative.  Patient started on Iron supplementation.  Feel that the patient is stable for discharge.  Return precautions given.  Patient also evaluated by Dr. Ashok Cordia who is in agreement with the plan.       Hyman Bible, PA-C 07/13/14 2409  Mirna Mires, MD 07/13/14 [phone]

## 2015-03-21 ENCOUNTER — Encounter (HOSPITAL_COMMUNITY): Payer: Self-pay | Admitting: Emergency Medicine

## 2015-03-21 ENCOUNTER — Emergency Department (HOSPITAL_COMMUNITY)
Admission: EM | Admit: 2015-03-21 | Discharge: 2015-03-21 | Disposition: A | Discharge status: Home / Self Care | Payer: No Typology Code available for payment source | Attending: Emergency Medicine | Admitting: Emergency Medicine

## 2015-03-21 DIAGNOSIS — Z880 Allergy status to penicillin: Secondary | ICD-10-CM | POA: Insufficient documentation

## 2015-03-21 DIAGNOSIS — Z87891 Personal history of nicotine dependence: Secondary | ICD-10-CM | POA: Diagnosis not present

## 2015-03-21 DIAGNOSIS — I10 Essential (primary) hypertension: Secondary | ICD-10-CM | POA: Diagnosis not present

## 2015-03-21 DIAGNOSIS — Z79899 Other long term (current) drug therapy: Secondary | ICD-10-CM | POA: Insufficient documentation

## 2015-03-21 DIAGNOSIS — L03311 Cellulitis of abdominal wall: Secondary | ICD-10-CM | POA: Insufficient documentation

## 2015-03-21 DIAGNOSIS — L02211 Cutaneous abscess of abdominal wall: Secondary | ICD-10-CM | POA: Diagnosis present

## 2015-03-21 MED ORDER — SULFAMETHOXAZOLE-TRIMETHOPRIM 800-160 MG PO TABS: 1.0000 | ORAL_TABLET | Freq: Two times a day (BID) | ORAL | Status: AC

## 2015-03-21 MED ORDER — HYDROCODONE-ACETAMINOPHEN 5-325 MG PO TABS: 1.0000 | ORAL_TABLET | ORAL | Status: DC | PRN

## 2015-03-21 MED ORDER — CEPHALEXIN 500 MG PO CAPS: 500.0000 mg | ORAL_CAPSULE | Freq: Four times a day (QID) | ORAL | Status: DC

## 2015-03-21 NOTE — Discharge Instructions (Signed)
Take the prescribed medication as directed.  Recommend to apply warm compresses and/or soaks to help aid healing. Follow-up with a primary care physician in the area.  See resource guide below. Return to the ED for new or worsening symptoms.   Emergency Department Resource Guide 1) Find a Doctor and Pay Out of Pocket Although you won't have to find out who is covered by your insurance plan, it is a good idea to ask around and get recommendations. You will then need to call the office and see if the doctor you have chosen will accept you as a new patient and what types of options they offer for patients who are self-pay. Some doctors offer discounts or will set up payment plans for their patients who do not have insurance, but you will need to ask so you aren't surprised when you get to your appointment.  2) Contact Your Local Health Department Not all health departments have doctors that can see patients for sick visits, but many do, so it is worth a call to see if yours does. If you don't know where your local health department is, you can check in your phone book. The CDC also has a tool to help you locate your state's health department, and many state websites also have listings of all of their local health departments.  3) Find a [facility] If your illness is not likely to be very severe or complicated, you may want to try a walk in clinic. These are popping up all over the country in pharmacies, drugstores, and shopping centers. They're usually staffed by nurse practitioners or physician assistants that have been trained to treat common illnesses and complaints. They're usually fairly quick and inexpensive. However, if you have serious medical issues or chronic medical problems, these are probably not your best option.  No Primary Care Doctor: - Call Health Connect at  [phone] - they can help you locate a primary care doctor that  accepts your insurance, provides certain services,  etc. - Physician Referral Service- [phone]  Chronic Pain Problems: Organization         Address  Phone   Notes  [facility]  [phone] Patients need to be referred by their primary care doctor.   Medication Assistance: Organization         Address  Phone   Notes  [facility] Medication [facility] [place] [place], [place] [address] [phone] --Must be a resident of [facility] -- Must have NO insurance coverage whatsoever (no Medicaid/ Medicare, etc.) -- The pt. MUST have a primary care doctor that directs their care regularly and follows them in the community   MedAssist  [phone]   [employer]  [phone]    Agencies that provide inexpensive medical care: Organization         Address  Phone   Notes  [place]  [phone]   [facility]    [phone]   [facility] [place], [place] [address] [phone]   [place] [address] [place] [phone]   Planned Parenthood    [phone]   [facility]    [phone]   [employer] and [place] [address], [place] Phone:  [phone], Fax:  [phone] Hours of Operation:  9 am - 6 pm, M-F.  Also accepts Medicaid/Medicare  and self-pay.  [facility] for [place] [address], [address], [place] Phone: [phone], Fax: [phone]. Hours of Operation:  8:30 am - 5:30 pm, M-F.  Also accepts Medicaid and self-pay.  [facility] [place] [address], [place] Phone: [phone]   [place], [place], [place] [phone], Ext. [phone] Mondays & Thursdays: 7-9 AM.  First 15 patients are seen on a first come, first serve basis.    [place] Providers:  Organization         Address  Phone   Notes  [facility] [address], [place] [phone] Also accepts self-pay patients.  [facility] [address], [place]  [phone]   [place], [address], [place] [phone]   [facility] [address] [place] [phone]   Lucianne Lei [address], [place]   [phone] Only accepts [place] Access [place] patients after they have their name applied to their card.   Self-Pay (no insurance) in [facility]:  Organization         Address  Phone   Notes  Sickle Cell Patients, [facility] [place] [phone]   [facility] Care [place] [phone]   [facility] [place]  [place], [place], [place] [phone]   [facility]/Dr. Osei-Bonsu  [address] [place] or [address], [place] [phone] Phone number for both [place] and [place] locations is the same.  Urgent Medical and [facility] [address] [place] [phone]   [facility] [address] [place] or [address] [phone]   [facility] [place] [address] [place] [phone], phone; [phone], fax Sees patients 1st and 3rd Saturday of every month.  Must not qualify for public or private insurance (i.e. Medicaid, Medicare, [place] Health Choice, Veterans' Benefits)  Household income should be no more than 200% of the poverty level The clinic cannot treat you if you are pregnant or think you are pregnant  Sexually transmitted diseases are not treated at the clinic.    Dental Care: Organization         Address  Phone  Notes  [facility] Department of [facility] [phone] Accepts children up to  age 26 who are enrolled in [place] or [place]; pregnant women with a Medicaid card; and children who have applied for Medicaid or [place] Health Choice, but were declined, whose parents can pay a reduced fee at time of service.  [facility] Department of [facility]  [address], [place] [phone] Accepts children up to age 43 who are enrolled in [place] or [place]; pregnant women with a Medicaid card; and children who have applied for Medicaid or [place] Health Choice, but were declined, whose parents can pay a reduced fee at time of service.  [facility] PRO[facility]  [place] [phone] Patients are seen by appointment only. Walk-ins are not accepted. [place] will see patients 55 years of age and older. Monday - Tuesday (8am-5pm) Most Wednesdays (  8:30-5pm) $30 per visit, cash only  [facility] Adult Dental Access PROGRAM  [address], [facility] [phone] Patients are seen by appointment only. Walk-ins are not accepted. [place] will see patients 72 years of age and older. One Wednesday Evening (Monthly: Volunteer Based).  $30 per visit, cash only  [place]  [phone] for adults; Children under age 45, call Graduate Pediatric Dentistry at [phone]. Children aged 7-14, please call [phone] to request a pediatric application.  Dental services are provided in all areas of dental care including fillings, crowns and bridges, complete and partial dentures, implants, gum treatment, root canals, and extractions. Preventive care is also provided. Treatment is provided to both adults and children. Patients are selected via a lottery and there is often a waiting list.   [facility] [address], [place]  [phone] [email or web address]   [facility] [address] [place], [place] [phone], Ext. [phone] Second and Fourth Thursday of  each month, opens at 6:30 AM; Clinic ends at 9 AM.  Patients are seen on a first-come first-served basis, and a limited number are seen during each clinic.   [facility]  [address] Hillard Danker [place], [place] [phone]   Eligibility Requirements You must have lived in [place], [place], or [place] counties for at least the last three months.   You cannot be eligible for state or federal sponsored [employer], including [employer], [place], or [employer].   You generally cannot be eligible for healthcare insurance through your employer.    How to apply: Eligibility screenings are held every Tuesday and Wednesday afternoon from 1:00 pm until 4:00 pm. You do not need an appointment for the interview!  [facility] [address], [place], [place]   [place]  [place] Department  [place]  [phone]    Behavioral Health Resources in the Community: Intensive Outpatient Programs Organization         Address  Phone  Notes  [place]. [address], [place], [place] [phone]   [facility] Outpatient [address] [place], [place]   ADS: [facility] & Drug S[facility] [address] [place], [place]   [place] [address],  [place], [place] or [phone]   Substance Abuse Resources Organization         Address  Phone  Notes  Alcohol and Drug Services  [phone]   [place]  [phone]   [facility] [place]   Chinita Pester  [phone]   Residential & Outpatient Substance Abuse Program  [phone]   Psychological Services Organization         Address  Phone  Notes  [facility] [place]  [place]  [phone]   [place] [address],  [place] or [phone]    Mobile Crisis Teams Organization         Address  Phone  Notes  [facility], Mobile Crisis Care Unit  [phone]   [facility] Psychotherapeutic Services  [address] [place], [place]   [place] Levels [address] [place] [phone]    Self-Help/Support Groups Organization         Address  Phone  Notes  Mental Health Assoc. of [place] - variety of support groups  [place] Call for more information  Narcotics Anonymous (NA), [facility] [address], [employer] [place]  2 meetings at this location   Special educational needs teacher         Address  Phone  Notes  [facility]AP Residential Treatment [place],    [place]  [phone]   [facility]  [address], [place] [phone], [place], [place]   [place], [place] [phone] Admissions: 8am-3pm M-F  Incentives Substance [place] [address],    [place], [place] [phone]   [facility] [address] [place], [place], [place]   [facility] [address]  [place], [place]   [facility] - Intensive Outpatient [address], [place], [place]   [facility] ([place].) [place].,  [place], [place] [phone] or [phone]   [facility] Services (RTS) [address] [place], [place] Accepts Medicaid  [facility] [place] [address],  [place] [phone] Substance Abuse/Addiction Treatment   [facility] Organization         Address  Phone  Notes  [facility]  [phone]   Domenic Schwab, PhD [address] Arlis Porta [place], [place]   [phone] or [phone]   [place], [place] [phone]     [facility] [address], [place], [place] [phone] Insurance/Medicaid/sponsorship through [facility] and Families [address], [address]                                    [place], [place] [phone] [place] [address], [place] [phone]    Dr. Adele Schilder  [phone]   [facility] Dept. 1) [address], [place] 2) [place] 3)  [address], [place] [phone]  [phone]   [place] [phone] or [phone] (After Hours)

## 2015-03-21 NOTE — ED Provider Notes (Signed)
CSN: [id number]     Arrival date & time 03/21/15  1040 History  By signing my name below, I, Erling Conte, attest that this documentation has been prepared under the direction and in the presence of Quincy Carnes, PA-C. Electronically Signed: Erling Conte, ED Scribe. 03/21/2015. 11:37 AM.    Chief Complaint  Patient presents with  . Abscess    The history is provided by the patient. No language interpreter was used.    HPI Comments: Linda Thomas is a 34 y.o. female who presents to the Emergency Department complaining of an painful, red, raised, warm, lesion to the suprapubic abdominal region. Pt denies any associated symptoms. She states she tried to squeeze the bump and there was purulent and bloody discharge from the area. She denies any other discharge from the area and reports she has not tried to pop it since. She has not tried any treatments prior to arrival. She has not taken any medications for this. no fever, chills, sweats.  She denies any h/o abscesses in the past. Pt denies any h/o DM. She does not have a PCP at this time. Pt has no other complaints at this time. She believes she has an allergy to Penicillin.   Past Medical History  Diagnosis Date  . Hypertension during pregnancy   Past Surgical History  Procedure Laterality Date  . Cesarean section    . Tubal ligation     No family history on file. Social History  Substance Use Topics  . Smoking status: Former Research scientist (life sciences)  . Smokeless tobacco: None  . Alcohol Use: Yes     Comment: social   OB History    No data available     Review of Systems  Skin: Positive for color change (abscess to abdomen).  All other systems reviewed and are negative.     Allergies  Other  Home Medications   Prior to Admission medications   Medication Sig Start Date End Date Taking? Authorizing Provider  acetaminophen (TYLENOL) 500 MG tablet Take 500 mg by mouth every 6 (six) hours as needed for moderate pain.     Historical Provider, MD  ferrous sulfate 325 (65 FE) MG tablet Take 1 tablet (325 mg total) by mouth daily. 07/12/14   Hyman Bible, PA-C  ibuprofen (ADVIL,MOTRIN) 200 MG tablet Take 200 mg by mouth every 6 (six) hours as needed for moderate pain.    Historical Provider, MD   Triage Vitals: BP 133/75 mmHg  Pulse 109  Temp(Src) 98.7 F (37.1 C) (Oral)  Resp 18  SpO2 100%  LMP 03/04/2015  Physical Exam  Constitutional: She is oriented to person, place, and time. She appears well-developed and well-nourished.  HENT:  Head: Normocephalic and atraumatic.  Mouth/Throat: Oropharynx is clear and moist.  Eyes: Conjunctivae and EOM are normal. Pupils are equal, round, and reactive to light.  Neck: Normal range of motion.  Cardiovascular: Normal rate, regular rhythm and normal heart sounds.   Pulmonary/Chest: Effort normal and breath sounds normal. No respiratory distress. She has no wheezes.  Abdominal: There is no tenderness. There is no rigidity and no guarding.    Musculoskeletal: Normal range of motion.  Neurological: She is alert and oriented to person, place, and time.  Skin: Skin is warm and dry.  Psychiatric: She has a normal mood and affect.  Nursing note and vitals reviewed.   ED Course  Procedures (including critical care time)  DIAGNOSTIC STUDIES: Oxygen Saturation is 100% on RA, normal by my interpretation.  COORDINATION OF CARE:    Labs Review Labs Reviewed - No data to display  Imaging Review No results found.   EKG Interpretation None      MDM   Final diagnoses:  Cellulitis, abdominal wall   34 year old female with cellulitis of suprapubic abdominal wall. Patient tried to drain abscess herself, area has since scabbed over and she has developed a small patch of cellulitis, approximately 2 cm in diameter in all directions.  There is no appreciable fluctuance or fluid collection at this time. Patient is afebrile, nontoxic. I doubt extension into  abdominal cavity at this time, infection appears superficial. Patient will be started on Bactrim and Keflex, Vicodin for pain. She was encouraged to do warm soaks and/or warm compresses to help aid healing. She is instructed to monitor area closely over the next 24-48 hours for increasing signs of infection. She was given resource guide to establish care with PCP.  Discussed plan with patient, he/she acknowledged understanding and agreed with plan of care.  Return precautions given for new or worsening symptoms.  I personally performed the services described in this documentation, which was scribed in my presence. The recorded information has been reviewed and is accurate.  Larene Pickett, PA-C 03/21/15 1159  Pattricia Boss, MD 03/23/15 1322

## 2015-03-21 NOTE — ED Notes (Signed)
Noticed a bump two days ago.  Tried to squeeze bump, noticed very painful.  Area tender, warm to touch

## 2015-07-31 ENCOUNTER — Encounter (HOSPITAL_COMMUNITY): Payer: Self-pay | Admitting: Emergency Medicine

## 2015-07-31 ENCOUNTER — Emergency Department (HOSPITAL_COMMUNITY)
Admission: EM | Admit: 2015-07-31 | Discharge: 2015-07-31 | Disposition: A | Discharge status: Home / Self Care | Payer: No Typology Code available for payment source | Attending: Emergency Medicine | Admitting: Emergency Medicine

## 2015-07-31 DIAGNOSIS — N938 Other specified abnormal uterine and vaginal bleeding: Secondary | ICD-10-CM | POA: Insufficient documentation

## 2015-07-31 DIAGNOSIS — R42 Dizziness and giddiness: Secondary | ICD-10-CM | POA: Insufficient documentation

## 2015-07-31 DIAGNOSIS — Z87891 Personal history of nicotine dependence: Secondary | ICD-10-CM | POA: Insufficient documentation

## 2015-07-31 DIAGNOSIS — IMO0001 Reserved for inherently not codable concepts without codable children: Secondary | ICD-10-CM

## 2015-07-31 DIAGNOSIS — Z3202 Encounter for pregnancy test, result negative: Secondary | ICD-10-CM | POA: Insufficient documentation

## 2015-07-31 DIAGNOSIS — R030 Elevated blood-pressure reading, without diagnosis of hypertension: Secondary | ICD-10-CM | POA: Insufficient documentation

## 2015-07-31 DIAGNOSIS — R0981 Nasal congestion: Secondary | ICD-10-CM | POA: Insufficient documentation

## 2015-07-31 DIAGNOSIS — E669 Obesity, unspecified: Secondary | ICD-10-CM | POA: Insufficient documentation

## 2015-07-31 DIAGNOSIS — N920 Excessive and frequent menstruation with regular cycle: Secondary | ICD-10-CM

## 2015-07-31 DIAGNOSIS — R5383 Other fatigue: Secondary | ICD-10-CM | POA: Insufficient documentation

## 2015-07-31 DIAGNOSIS — N939 Abnormal uterine and vaginal bleeding, unspecified: Secondary | ICD-10-CM

## 2015-07-31 LAB — CBC WITH DIFFERENTIAL/PLATELET
BASOS ABS: 0 10*3/uL (ref 0.0–0.1)
Basophils Relative: 0 %
EOS ABS: 0.2 10*3/uL (ref 0.0–0.7)
Eosinophils Relative: 2 %
HCT: 36.5 % (ref 36.0–46.0)
Hemoglobin: 11.8 g/dL — ABNORMAL LOW (ref 12.0–15.0)
Lymphocytes Relative: 48 %
Lymphs Abs: 3.1 10*3/uL (ref 0.7–4.0)
MCH: 26.6 pg (ref 26.0–34.0)
MCHC: 32.3 g/dL (ref 30.0–36.0)
MCV: 82.2 fL (ref 78.0–100.0)
Monocytes Absolute: 0.5 10*3/uL (ref 0.1–1.0)
Monocytes Relative: 7 %
NEUTROS ABS: 2.9 10*3/uL (ref 1.7–7.7)
Neutrophils Relative %: 43 %
Platelets: 244 10*3/uL (ref 150–400)
RBC: 4.44 MIL/uL (ref 3.87–5.11)
RDW: 15.8 % — ABNORMAL HIGH (ref 11.5–15.5)
WBC: 6.6 10*3/uL (ref 4.0–10.5)

## 2015-07-31 LAB — BASIC METABOLIC PANEL
Anion gap: 13 (ref 5–15)
BUN: 15 mg/dL (ref 6–20)
CO2: 22 mmol/L (ref 22–32)
CREATININE: 0.54 mg/dL (ref 0.44–1.00)
Calcium: 9.4 mg/dL (ref 8.9–10.3)
Chloride: 106 mmol/L (ref 101–111)
GFR calc Af Amer: >60 mL/min (ref >60)
GFR calc non Af Amer: >60 mL/min (ref >60)
Glucose, Bld: 95 mg/dL (ref 65–99)
Potassium: 3.6 mmol/L (ref 3.5–5.1)
SODIUM: 141 mmol/L (ref 135–145)

## 2015-07-31 LAB — POC URINE PREG, ED: PREG TEST UR: NEGATIVE

## 2015-07-31 MED ORDER — ETONOGESTREL-ETHINYL ESTRADIOL 0.12-0.015 MG/24HR VA RING: VAGINAL_RING | VAGINAL | Status: DC

## 2015-07-31 NOTE — ED Notes (Signed)
Patient verbalized understanding of discharge instructions and denies any further needs or questions at this time. VS stable. Patient ambulatory with steady gait.  

## 2015-07-31 NOTE — ED Provider Notes (Signed)
CSN: [id number]     Arrival date & time 07/31/15  1507 History   First MD Initiated Contact with Patient 07/31/15 1839     Chief Complaint  Patient presents with  . Vaginal Bleeding   HPI Comments: Patient reports a roughly 1 month history of vaginal bleeding.  She notes that she has bled heavily the entire month.  She previously had regular periods each month that lasted about 7-10 days at a time.  She in October used a short course of OCPs in preparation for a trip in November.  She was amenorrheic for Nov and Dec.  She is not currently on OCPs.  She had a BLTL several years ago.  She denies chance of pregnancy.  Denies chest pain, SOB, loss of consciousness.  She endorses occ dizziness.  No dysuria, blood in stool or urine that she can tell.  She is a nonsmoker.  She has no personal or family history of bleeding disorders or uterine/ovarian/breast/colon cancers.  She denies history of fibroids.  She does not have a PCP.  Not on any medications.  No blood thinners, ASA, NSAIDs.  No PMH.  She would be open to [place] for regulation of period.  The history is provided by the patient. No language interpreter was used.    Past Medical History  Diagnosis Date  . Hypertension during pregnancy   Past Surgical History  Procedure Laterality Date  . Cesarean section    . Tubal ligation     No family history on file. Social History  Substance Use Topics  . Smoking status: Former Research scientist (life sciences)  . Smokeless tobacco: None  . Alcohol Use: Yes     Comment: social   OB History    No data available     Review of Systems  Constitutional: Positive for fatigue. Negative for fever, chills and appetite change.  HENT: Positive for congestion. Negative for nosebleeds.   Respiratory: Negative for cough, chest tightness and shortness of breath.   Cardiovascular: Negative for chest pain and palpitations.  Gastrointestinal: Negative for nausea, vomiting, abdominal pain and blood in stool.  Genitourinary:  Positive for vaginal bleeding. Negative for dysuria and hematuria.  Skin: Negative for pallor.  Neurological: Positive for dizziness and light-headedness. Negative for syncope, weakness, numbness and headaches.   Allergies  Other  Home Medications   Prior to Admission medications   Medication Sig Start Date End Date Taking? Authorizing Provider  ibuprofen (ADVIL,MOTRIN) 200 MG tablet Take 400 mg by mouth every 6 (six) hours as needed for headache.    Yes Historical Provider, MD  etonogestrel-ethinyl estradiol (NUVARING) 0.12-0.015 MG/24HR vaginal ring Insert vaginally and leave in place for 3 consecutive weeks, then remove for 1 week. 07/31/15   Janora Norlander, DO  ferrous sulfate 325 (65 FE) MG tablet Take 1 tablet (325 mg total) by mouth daily. Patient not taking: Reported on 07/31/2015 07/12/14   Hyman Bible, PA-C   BP 148/97 mmHg  Pulse 103  Temp(Src) 98.8 F (37.1 C) (Oral)  Resp 16  SpO2 97% Physical Exam  Constitutional: She is oriented to person, place, and time. She appears well-developed and well-nourished.  Obese  HENT:  Head: Normocephalic and atraumatic.  Mouth/Throat: Oropharynx is clear and moist.  Eyes: Conjunctivae and EOM are normal. Pupils are equal, round, and reactive to light.  Neck: Normal range of motion. Neck supple.  Cardiovascular: Normal rate, regular rhythm, normal heart sounds and intact distal pulses.   No murmur heard. Pulmonary/Chest: Effort normal and  breath sounds normal. No respiratory distress. She has no wheezes.  Abdominal: Soft. Bowel sounds are normal. She exhibits no mass. There is no tenderness. There is no rebound and no guarding.  obese  Genitourinary: There is bleeding in the vagina.  Moderate bleeding from cervical os. Cervix anteverted.  No adnexal or uterine masses palpated but exam technically difficult secondary to body habitus.  Vagina and cervix otherwise unremarkable.  Musculoskeletal: Normal range of motion.   Lymphadenopathy:    She has no cervical adenopathy.  Neurological: She is alert and oriented to person, place, and time. She exhibits normal muscle tone. Coordination normal.  Skin: Skin is warm. No rash noted. No pallor.  Psychiatric: She has a normal mood and affect. Her behavior is normal.   ED Course  Procedures (including critical care time) Labs Review Labs Reviewed  CBC WITH DIFFERENTIAL/PLATELET - Abnormal; Notable for the following:    Hemoglobin 11.8 (*)    RDW 15.8 (*)    All other components within normal limits  BASIC METABOLIC PANEL  POC URINE PREG, ED    Imaging Review No results found. I have personally reviewed and evaluated these images and lab results as part of my medical decision-making.   EKG Interpretation None      MDM   Final diagnoses:  Excessive vaginal bleeding  Elevated blood pressure   1931: CBC, BMP pending.  Visual inspection of vaginal with moderate bleeding from cervical os but no other abnormalities.  Repeat vitals.  1947: Repeat vitals improved from previous.  HR down to 103.  BP 148/97.  JOAL ZUPKO is a 35 y.o. female that presents to ED with prolonged vaginal bleeding.  CBC showed hgb 11.8, which is improved from last check.  She was well appearing on exam.  No physical manifestations of anemia on exam.  List of Primary care resources given to patient.  Recommended that she establish for continued management of vaginal bleeding and possible new onset hypertension.  Return precautions reviewed with patient.  Patient discharged in stable condition with a 3 month supply of Nuvaring and instructions to continue daily iron supplement.    Janora Norlander, DO 07/31/15 2049  Leonard Schwartz, MD 07/31/15 2056

## 2015-07-31 NOTE — ED Notes (Signed)
MD at bedside. 

## 2015-07-31 NOTE — Discharge Instructions (Signed)
You were seen in the emergency department for excessive vaginal bleeding. Vaginal exam showed no abnormalities.  Your labs were fairly normal.  Your hemoglobin level (measures for anemia) is actually improved from last time.  Continue to take a daily over the counter Iron tablet daily.  You are being discharged with 3 months of Nuvaring.  Please establish with a PCP.  They will continue to manage your Nuvaring and recheck your blood pressure.  You may need blood pressure medications.  I have provided a list of offices below.  You are welcome to contact our clinic at [phone] if you would like to establish there.   Abnormal Uterine Bleeding Abnormal uterine bleeding means bleeding from the vagina that is not your normal menstrual period. This can be:  Bleeding or spotting between periods.  Bleeding after sex (sexual intercourse).  Bleeding that is heavier or more than normal.  Periods that last longer than usual.  Bleeding after menopause. There are many problems that may cause this. Treatment will depend on the cause of the bleeding. Any kind of bleeding that is not normal should be reviewed by your doctor.  HOME CARE Watch your condition for any changes. These actions may lessen any discomfort you are having:  Do not use tampons or douches as told by your doctor.  Change your pads often. You should get regular pelvic exams and Pap tests. Keep all appointments for tests as told by your doctor. GET HELP IF:  You are bleeding for more than 1 week.  You feel dizzy at times. GET HELP RIGHT AWAY IF:   You pass out.  You have to change pads every 15 to 30 minutes.  You have belly pain.  You have a fever.  You become sweaty or weak.  You are passing large blood clots from the vagina.  You feel sick to your stomach (nauseous) and throw up (vomit). MAKE SURE YOU:  Understand these instructions.  Will watch your condition.  Will get help right away if you are not doing well  or get worse.   This information is not intended to replace advice given to you by your health care provider. Make sure you discuss any questions you have with your health care provider.   Document Released: 03/20/2009 Document Revised: 05/28/2013 Document Reviewed: 12/20/2012 Elsevier Interactive Patient Education 2016 [employer].  Hypertension Hypertension is another name for high blood pressure. High blood pressure forces your heart to work harder to pump blood. A blood pressure reading has two numbers, which includes a higher number over a lower number (example: 110/72). HOME CARE   Have your blood pressure rechecked by your doctor.  Only take medicine as told by your doctor. Follow the directions carefully. The medicine does not work as well if you skip doses. Skipping doses also puts you at risk for problems.  Do not smoke.  Monitor your blood pressure at home as told by your doctor. GET HELP IF:  You think you are having a reaction to the medicine you are taking.  You have repeat headaches or feel dizzy.  You have puffiness (swelling) in your ankles.  You have trouble with your vision. GET HELP RIGHT AWAY IF:   You get a very bad headache and are confused.  You feel weak, numb, or faint.  You get chest or belly (abdominal) pain.  You throw up (vomit).  You cannot breathe very well. MAKE SURE YOU:   Understand these instructions.  Will watch your condition.  Will get help right away if you are not doing well or get worse.   This information is not intended to replace advice given to you by your health care provider. Make sure you discuss any questions you have with your health care provider.   Document Released: 11/09/2007 Document Revised: 05/28/2013 Document Reviewed: 03/15/2013 Elsevier Interactive Patient Education 2016 [employer].  Emergency Department Resource Guide 1) Find a Doctor and Pay Out of Pocket Although you won't have to find out who is  covered by your insurance plan, it is a good idea to ask around and get recommendations. You will then need to call the office and see if the doctor you have chosen will accept you as a new patient and what types of options they offer for patients who are self-pay. Some doctors offer discounts or will set up payment plans for their patients who do not have insurance, but you will need to ask so you aren't surprised when you get to your appointment.  2) Contact Your Local Health Department Not all health departments have doctors that can see patients for sick visits, but many do, so it is worth a call to see if yours does. If you don't know where your local health department is, you can check in your phone book. The CDC also has a tool to help you locate your state's health department, and many state websites also have listings of all of their local health departments.  3) Find a [facility] If your illness is not likely to be very severe or complicated, you may want to try a walk in clinic. These are popping up all over the country in pharmacies, drugstores, and shopping centers. They're usually staffed by nurse practitioners or physician assistants that have been trained to treat common illnesses and complaints. They're usually fairly quick and inexpensive. However, if you have serious medical issues or chronic medical problems, these are probably not your best option.  No Primary Care Doctor: - Call Health Connect at  [phone] - they can help you locate a primary care doctor that  accepts your insurance, provides certain services, etc. - Physician Referral Service- [phone]  Chronic Pain Problems: Organization         Address  Phone   Notes  [facility]  [phone] Patients need to be referred by their primary care doctor.   Medication Assistance: Organization         Address  Phone   Notes  [facility] Medication [facility] [address] [place], [place] [address] [phone] --Must be a resident of [facility] -- Must have NO insurance coverage whatsoever (no Medicaid/ Medicare, etc.) -- The pt. MUST have a primary care doctor that directs their care regularly and follows them in the community   Med[facility]ist  [phone]   [employer]  [phone]    Agencies that provide inexpensive medical care: Organization         Address  Phone   Notes  [place]  [phone]   [facility]    [phone]   [facility] [place], [place] [address] [phone]   [place] [address] [place] [phone]   Planned Parenthood    [phone]   [facility]    [phone]   [place] and [place], [place]  Phone:  [phone], Fax:  [phone] Hours of Operation:  9 am - 6 pm, M-F.  Also accepts Medicaid/Medicare and self-pay.  [facility] for [place], [address], [place] Phone: [phone], Fax: [phone]. Hours of Operation:  8:30 am - 5:30 pm, M-F.  Also accepts Medicaid and self-pay.  [facility] [place] [address], [place] Phone: [phone]   [place], [place], [place] [phone], Ext. [phone] Mondays & Thursdays: 7-9 AM.  First 15 patients are seen on a first come, first serve basis.    [place] Providers:  Organization         Address  Phone   Notes  [facility] [address],  [phone] Also accepts self-pay patients.  [facility] [id number] [place], [place]  [phone]   [place], [address], [place] [phone]   [facility] [address], [place] [phone]   Lucianne Lei [address], [place]   [phone] Only accepts [place] Access [place] patients after they have their name applied to their card.   Self-Pay (no insurance) in [facility]:  Organization         Address  Phone   Notes  Sickle Cell Patients, [facility] [place] [phone]   [facility] [place] [phone]   [facility] [place]  [place], [place], [place] [phone]   [facility]/Dr. Osei-Bonsu  [address], [place] or [address], [place] [phone] Phone number for both [place] and [place] locations is the same.  [facility]gent [facility] and [facility] [address] [place] [phone]   [facility] [address] [place] [address], [place] or [address] [phone]   [facility] [address] [place] [phone], phone; [phone], fax Sees patients 1st and 3rd Saturday of every month.  Must not qualify for public or private insurance (i.e. Medicaid, Medicare, [place] Health Choice, Veterans' Benefits)  Household income should be no more than 200% of the poverty level The clinic cannot treat you if you are pregnant or think you are pregnant  Sexually transmitted diseases are not treated at the clinic.    Dental Care: Organization         Address  Phone  Notes  [facility] Department of [facility] [place] [phone] Accepts children up to age 76 who are enrolled in [place] or [place]; pregnant women with a Medicaid card; and children who have applied for Medicaid or [place] Health Choice, but were declined, whose parents can pay a reduced fee at time of service.  [facility] Department of [facility]  [address], [place] [phone] Accepts children up to age 75 who are enrolled  in [place] or [place]; pregnant women with a Medicaid card; and children who have applied for Medicaid or [place] Health Choice, but were declined, whose parents can pay a reduced fee at time of service.  [place] Adult Dental Access PROGRAM  [place] [phone] Patients are seen by appointment  only. Walk-ins are not accepted. [place] will see patients 42 years of age and older. Monday - Tuesday (8am-5pm) Most Wednesdays (8:30-5pm) $30 per visit, cash only  [facility] Adult Dental Access PROGRAM  [address], [facility] [phone] Patients are seen by appointment only. Walk-ins are not accepted. [place] will see patients 36 years of age and older. One Wednesday Evening (Monthly: Volunteer Based).  $30 per visit, cash only  [place]  [phone] for adults; Children under age 31, call Graduate Pediatric Dentistry at [phone]. Children aged 22-14, please call [phone] to request a pediatric application.  Dental services are provided in all areas of dental care including fillings, crowns and bridges, complete and partial dentures, implants, gum treatment, root canals, and extractions. Preventive care is also provided. Treatment is provided to both adults and children. Patients are selected via a lottery and there is often a waiting list.   [facility] [address] [place]  [phone] [email or web address]   [facility] [address] [place], [place] [phone], Ext. [phone] Second and Fourth Thursday of each month, opens at 6:30 AM; Clinic ends at 9 AM.  Patients are seen on a first-come first-served basis, and a limited number are seen during each clinic.   [facility]  [address] Danker [place], [place] [phone]   Eligibility Requirements You must have lived in [place], [place], or [place] counties for at least the last three months.   You cannot be  eligible for state or federal sponsored [employer], including [employer], [place], or [employer].   You generally cannot be eligible for healthcare insurance through your employer.    How to apply: Eligibility screenings are held every Tuesday and Wednesday afternoon from 1:00 pm until 4:00 pm. You do not need an appointment for the interview!  [facility] [address] [place], [place]   [place]  [place] Department  [place]  [phone]    Behavioral Health Resources in the Community: Intensive Outpatient Programs Organization         Address  Phone  Notes  [place]. [address], [place], [place] [phone]   [facility] [address] [place], [place]   ADS: [facility] [address] [place], [place]   [place] [address]  [place], [place] or [phone]   Substance Abuse Resources Organization         Address  Phone  Notes  Alcohol and Drug Services  [phone]   [place]  [phone]   [facility] [place]   Chinita Pester  [phone]   Residential & Outpatient Substance Abuse Program  [phone]   Psychological Services Organization         Address  Phone  Notes  [facility] [place]  [place]  [phone]   [place] [address], [place] [phone] or [phone]    Mobile Crisis Teams Organization         Address  Phone  Notes  [facility], Mobile Crisis Care Unit  [phone]   [facility]  [address] [place], [place]   [facility] [address] [place] [phone]  Self-Help/Support Groups Organization          Address  Phone             Notes  [facility] [place] - variety of support groups  [place] Call for more information  Narcotics Anonymous (NA), [facility] [address], [employer] [place]  2 meetings at this location   Special educational needs teacher         Address  Phone  Notes  [facility] [place],    [place]  [phone]   [facility]  [address], [place] [id number], [place], [place]   [place], [place] [phone] Admissions: 8am-3pm M-F  Incentives Substance Albert Lea [address],    [place], [place] [phone]   [facility] [address] [place], [place], [place]   [facility] [address],  [place], [place]   [facility] - Intensive Outpatient [address], [place], [place]   [facility] ([place].) [place].,  [place], [place] [phone] or [phone]   [facility] (RTS) [address] [place], [place] Accepts Medicaid  [facility] [place] [address],  [place] [phone] Substance Abuse/Addiction Treatment   [facility] Organization         Address  Phone  Notes  [facility]  [phone]   Domenic Schwab, PhD [address] Arlis [place], [place]   [phone] or [phone]   [place], [place] [phone]   [facility] [address] [place], [place] [phone] Insurance/Medicaid/sponsorship through [facility] and Families [address], [address]                                    [place], [place] [phone] [place] [address], [place] [phone]    Dr. Adele Schilder  [phone]   [facility] Dept. 1) [address], [place] 2) [place] 3)  [address], [place] [phone]  [phone]   [place] [phone] or [phone] (After Hours)

## 2015-07-31 NOTE — ED Notes (Signed)
Pt states shes had constant vaginal bleeding x1 month, states she has "clots". Pt also states she took someone elses birth control in October, skipped a period in November/Dec but in January shes had constant bleeding. Denies seeing a doctor for her symptoms.

## 2015-11-06 ENCOUNTER — Encounter (HOSPITAL_COMMUNITY): Payer: Self-pay | Admitting: Emergency Medicine

## 2015-11-06 ENCOUNTER — Emergency Department (HOSPITAL_COMMUNITY)
Admission: EM | Admit: 2015-11-06 | Discharge: 2015-11-06 | Disposition: A | Discharge status: Home / Self Care | Payer: BLUE CROSS/BLUE SHIELD | Attending: Emergency Medicine | Admitting: Emergency Medicine

## 2015-11-06 DIAGNOSIS — Z87891 Personal history of nicotine dependence: Secondary | ICD-10-CM | POA: Diagnosis not present

## 2015-11-06 DIAGNOSIS — M791 Myalgia, unspecified site: Secondary | ICD-10-CM

## 2015-11-06 DIAGNOSIS — R079 Chest pain, unspecified: Secondary | ICD-10-CM | POA: Insufficient documentation

## 2015-11-06 DIAGNOSIS — Z791 Long term (current) use of non-steroidal anti-inflammatories (NSAID): Secondary | ICD-10-CM | POA: Insufficient documentation

## 2015-11-06 DIAGNOSIS — J3489 Other specified disorders of nose and nasal sinuses: Secondary | ICD-10-CM | POA: Insufficient documentation

## 2015-11-06 DIAGNOSIS — Z79899 Other long term (current) drug therapy: Secondary | ICD-10-CM | POA: Diagnosis not present

## 2015-11-06 DIAGNOSIS — M546 Pain in thoracic spine: Secondary | ICD-10-CM | POA: Diagnosis present

## 2015-11-06 MED ORDER — IBUPROFEN 800 MG PO TABS: 800.0000 mg | ORAL_TABLET | Freq: Three times a day (TID) | ORAL | Status: DC | PRN

## 2015-11-06 NOTE — ED Provider Notes (Signed)
CSN: [id number]     Arrival date & time 11/06/15  1133 History  By signing my name below, I, Rayna Sexton, attest that this documentation has been prepared under the direction and in the presence of Domenic Moras, PA-C. Electronically Signed: Rayna Sexton, ED Scribe. 11/06/2015. 12:06 PM.   Chief Complaint  Patient presents with  . Generalized Body Aches   The history is provided by the patient. No language interpreter was used.    HPI Comments: Linda Thomas is a 35 y.o. female who presents to the Emergency Department complaining of constant, mild, diffuse body aches x 2 days. She reports associated central, mild, upper chest pain, upper back pain, fever (TMAX 102 F last night), congestion, post nasal drip, HA and neck pain. She has taken ibuprofen without significant relief. Pt reports multiple sick contacts with cold-like symptoms. She has an allergy to penicillin which makes her itch. She denies ear pain, sore throat, rhinorrhea, n/v/d, coughing or rash.    Past Medical History  Diagnosis Date  . Hypertension during pregnancy   Past Surgical History  Procedure Laterality Date  . Cesarean section    . Tubal ligation     History reviewed. No pertinent family history. Social History  Substance Use Topics  . Smoking status: Former Research scientist (life sciences)  . Smokeless tobacco: None  . Alcohol Use: Yes     Comment: social   OB History    No data available     Review of Systems  HENT: Positive for congestion. Negative for ear pain and sore throat.   Gastrointestinal: Negative for nausea, vomiting and diarrhea.  Skin: Negative for rash.    Allergies  Other  Home Medications   Prior to Admission medications   Medication Sig Start Date End Date Taking? Authorizing Provider  etonogestrel-ethinyl estradiol (NUVARING) 0.12-0.015 MG/24HR vaginal ring Insert vaginally and leave in place for 3 consecutive weeks, then remove for 1 week. 07/31/15   Janora Norlander, DO  ferrous sulfate 325  (65 FE) MG tablet Take 1 tablet (325 mg total) by mouth daily. Patient not taking: Reported on 07/31/2015 07/12/14   Hyman Bible, PA-C  ibuprofen (ADVIL,MOTRIN) 200 MG tablet Take 400 mg by mouth every 6 (six) hours as needed for headache.     Historical Provider, MD   BP 127/80 mmHg  Pulse 115  Temp(Src) 98.4 F (36.9 C) (Oral)  Resp 18  Ht 5\' 3"  (1.6 m)  Wt 227 lb 3 oz (103.052 kg)  BMI 40.25 kg/m2  SpO2 99%  LMP 10/25/2015    Physical Exam  Constitutional: She is oriented to person, place, and time. She appears well-developed and well-nourished. No distress.  African American female sitting upright, speaking in complete sentences and in NAD  HENT:  Head: Normocephalic and atraumatic.  Right Ear: Tympanic membrane, external ear and ear canal normal.  Left Ear: Tympanic membrane, external ear and ear canal normal.  Nose: Rhinorrhea present.  Mouth/Throat: Uvula is midline, oropharynx is clear and moist and mucous membranes are normal.  Eyes: EOM are normal.  Neck: Normal range of motion. Neck supple.  No nuchal rigidity  Cardiovascular: Normal rate, regular rhythm and normal heart sounds.  Exam reveals no gallop and no friction rub.   No murmur heard. Pulmonary/Chest: Effort normal and breath sounds normal. No respiratory distress. She has no wheezes. She has no rales.  Abdominal: Soft.  Musculoskeletal: Normal range of motion.  Neurological: She is alert and oriented to person, place, and time.  Skin:  Skin is warm and dry. She is not diaphoretic.  Psychiatric: She has a normal mood and affect.  Nursing note and vitals reviewed.   ED Course  Procedures  DIAGNOSTIC STUDIES: Oxygen Saturation is 99% on RA, normal by my interpretation.    COORDINATION OF CARE: 12:17 PM Pt presents with congestion and body aches. Suspect viral URI.  Mild tachycardia but no SOB and no significant risk factors for PE.  Likely from fever.  No nuchal rigidity to suggest meningitis.  Pt is well  appearing, has sick contact. Discussed next steps with pt including ibuprofen, rest. Pt verbalized understanding and is agreeable with the plan.    MDM   Final diagnoses:  Myalgia    BP 127/80 mmHg  Pulse 115  Temp(Src) 98.4 F (36.9 C) (Oral)  Resp 18  Ht 5\' 3"  (1.6 m)  Wt 103.052 kg  BMI 40.25 kg/m2  SpO2 99%  LMP 10/25/2015   I personally performed the services described in this documentation, which was scribed in my presence. The recorded information has been reviewed and is accurate.      Domenic Moras, PA-C 11/06/15 [place], MD 11/07/15 [phone]

## 2015-11-06 NOTE — Discharge Instructions (Signed)

## 2015-11-06 NOTE — ED Notes (Signed)
Pt here with generalized body aches and fever with mild cold symptoms since Wednesday> Pt sts fever of 102 last night.

## 2015-11-06 NOTE — ED Notes (Signed)
C/o upper chest pain, upper back and right neck pain with deep breathing. States she has body aches. States appetite is normal. Denies SOB. Denies sore throat or cough.

## 2016-06-03 ENCOUNTER — Emergency Department (HOSPITAL_COMMUNITY)
Admission: EM | Admit: 2016-06-03 | Discharge: 2016-06-03 | Disposition: A | Discharge status: Home / Self Care | Payer: BLUE CROSS/BLUE SHIELD | Attending: Emergency Medicine | Admitting: Emergency Medicine

## 2016-06-03 DIAGNOSIS — G510 Bell's palsy: Secondary | ICD-10-CM | POA: Diagnosis not present

## 2016-06-03 DIAGNOSIS — R200 Anesthesia of skin: Secondary | ICD-10-CM | POA: Diagnosis not present

## 2016-06-03 DIAGNOSIS — Z87891 Personal history of nicotine dependence: Secondary | ICD-10-CM | POA: Insufficient documentation

## 2016-06-03 DIAGNOSIS — I10 Essential (primary) hypertension: Secondary | ICD-10-CM | POA: Insufficient documentation

## 2016-06-03 DIAGNOSIS — Z79899 Other long term (current) drug therapy: Secondary | ICD-10-CM | POA: Insufficient documentation

## 2016-06-03 MED ORDER — VALACYCLOVIR HCL 1 G PO TABS: 1000.0000 mg | ORAL_TABLET | Freq: Three times a day (TID) | ORAL | 0 refills | Status: AC

## 2016-06-03 MED ORDER — PREDNISONE 20 MG PO TABS: 60.0000 mg | ORAL_TABLET | Freq: Every day | ORAL | 0 refills | Status: DC

## 2016-06-03 MED ORDER — FLUORESCEIN SODIUM 0.6 MG OP STRP
1.0000 | ORAL_STRIP | Freq: Once | OPHTHALMIC | Status: AC
  Administered 2016-06-03: 1 via OPHTHALMIC

## 2016-06-03 MED ORDER — TETRACAINE HCL 0.5 % OP SOLN
1.0000 [drp] | Freq: Once | OPHTHALMIC | Status: AC
Start: 2016-06-03 — End: 2016-06-03
  Administered 2016-06-03: 1 [drp] via OPHTHALMIC
  Filled 2016-06-03: qty 2

## 2016-06-03 MED ORDER — FLUORESCEIN SODIUM 0.6 MG OP STRP
ORAL_STRIP | OPHTHALMIC | Status: AC
  Filled 2016-06-03: qty 1

## 2016-06-03 NOTE — ED Triage Notes (Signed)
Spoke to pt at nurse first and did quick assessment. Pt states she noticed a tingling/numbness to her mouth/lips 3 days ago. States this am while brushing her teeth she noticed 'the water just coming out' of her mouth. States her neck was painful 3 days ago when she noticed the numbness. Right facial droop noted. No slurred speech. Grips strong and equal.

## 2016-06-03 NOTE — Discharge Instructions (Signed)
Please use additive free eye drops once every hour until your symptoms resolve.  Please tape your eyelid closed at night.  Please follow-up with the clinic listed above.  Return if you have numbness, weakness, or tingling of your arms or legs.

## 2016-06-03 NOTE — ED Triage Notes (Signed)
States she feels like the top of her tongue has a 'oil' feeling on it. States she can't call this feeling a numbness but it's a 'weird' feeling. No difficulty speaking noted. Pt's right eyelid doesn't as tightly as her left. Pt states her right eye has been watering more than left. Denies fevers. Grips equal. Ambulatory without difficulty.

## 2016-06-03 NOTE — ED Notes (Signed)
Verbalizes understanding of discharge instructions. Given 'Know Your Options" brochure with coupon card at discharge.

## 2016-06-03 NOTE — ED Provider Notes (Signed)
[facility] DEPT Provider Note   CSN: [id number] Arrival date & time: 06/03/16  1413  By signing my name below, I, Gwenlyn Fudge, attest that this documentation has been prepared under the direction and in the presence of Montine Circle, PA-C. Electronically Signed: Gwenlyn Fudge, ED Scribe. 06/03/16. 3:55 PM.  History   Chief Complaint Chief Complaint  Patient presents with  . Numbness   The history is provided by the patient. No language interpreter was used.   HPI Comments: Linda Thomas is a 35 y.o. female wit PMHx of HTN who presents to the Emergency Department complaining of gradual onset right sided facial droop for 3 days. Pt states face feels slightly numb. 3 days PTA, pt notes having right sided neck pain that gradually resolved. This morning, she noticed "water was just coming out of her mouth" while she was brushing her teeth. Pt reports associated abnormal taste in mouth, right eye watering, decreased right eyelid closing strength. She  denies fever and slurred speech.  Denies any numbness, weakness, or tingling of her extremities.   Past Medical History:  Diagnosis Date  . Hypertension during pregnancy    There are no active problems to display for this patient.   Past Surgical History:  Procedure Laterality Date  . CESAREAN SECTION    . TUBAL LIGATION      OB History    No data available       Home Medications    Prior to Admission medications   Medication Sig Start Date End Date Taking? Authorizing Provider  etonogestrel-ethinyl estradiol (NUVARING) 0.12-0.015 MG/24HR vaginal ring Insert vaginally and leave in place for 3 consecutive weeks, then remove for 1 week. 07/31/15   Janora Norlander, DO  ferrous sulfate 325 (65 FE) MG tablet Take 1 tablet (325 mg total) by mouth daily. Patient not taking: Reported on 07/31/2015 07/12/14   Hyman Bible, PA-C  ibuprofen (ADVIL,MOTRIN) 800 MG tablet Take 1 tablet (800 mg total) by mouth every 8 (eight)  hours as needed for fever or moderate pain. 11/06/15   Domenic Moras, PA-C    Family History No family history on file.  Social History Social History  Substance Use Topics  . Smoking status: Former Research scientist (life sciences)  . Smokeless tobacco: Not on file  . Alcohol use Yes     Comment: social     Allergies   Other   Review of Systems Review of Systems  Constitutional: Negative for fever.  Eyes: Positive for discharge.  Neurological: Positive for facial asymmetry. Negative for speech difficulty, weakness and numbness.  All other systems reviewed and are negative.    Physical Exam Updated Vital Signs BP 143/91 (BP Location: Right Arm)   Pulse 84   Temp 97.9 F (36.6 C) (Oral)   Resp 14   SpO2 100%   Physical Exam  Constitutional: She is oriented to person, place, and time. She appears well-developed and well-nourished.  HENT:  Head: Normocephalic and atraumatic.  Mild right sided facial droop including the forehead, decreased eyes closed strength on right side, decreased nasolabial fold  Eyes: Conjunctivae and EOM are normal. Pupils are equal, round, and reactive to light.  No corneal abrasion No floresceine uptake  Neck: Normal range of motion. Neck supple.  Cardiovascular: Normal rate and regular rhythm.  Exam reveals no gallop and no friction rub.   No murmur heard. Pulmonary/Chest: Effort normal and breath sounds normal. No respiratory distress. She has no wheezes. She has no rales. She exhibits no tenderness.  Abdominal: Soft. Bowel sounds are normal. She exhibits no distension and no mass. There is no tenderness. There is no rebound and no guarding.  Musculoskeletal: Normal range of motion. She exhibits no edema or tenderness.  Neurological: She is alert and oriented to person, place, and time.  Sensation and strength of bilateral upper and lower extremities is 5/5 No slurred speech No vision changes  Skin: Skin is warm and dry.  Psychiatric: She has a normal mood and affect.  Her behavior is normal. Judgment and thought content normal.  Nursing note and vitals reviewed.    ED Treatments / Results  DIAGNOSTIC STUDIES: Oxygen Saturation is 100% on RA, normal by my interpretation.    COORDINATION OF CARE: 3:56 PM Discussed treatment plan with pt at bedside which includes Prednisone and Valtrex and pt agreed to plan.  Labs (all labs ordered are listed, but only abnormal results are displayed) Labs Reviewed - No data to display  EKG  EKG Interpretation None       Radiology No results found.  Procedures Procedures (including critical care time)  Medications Ordered in ED Medications - No data to display   Initial Impression / Assessment and Plan / ED Course  I have reviewed the triage vital signs and the nursing notes.  Pertinent labs & imaging results that were available during my care of the patient were reviewed by me and considered in my medical decision making (see chart for details).  Clinical Course     Patient with right-sided facial droop involving the forehead, decreased right eyes closed strength, tearing from right eye, abnormal taste food. Symptoms are consistent with Bell's palsy. She is otherwise neurovascularly intact. Symptoms are not characteristic of stroke. Due to onset of symptoms, 3 days ago, I will give her prednisone and Valtrex. I have advised her to taper her right eyelid closed at night, and to use artificial tears hourly throughout the day. I have also given her resources to follow up with a primary care doctor in the next week. Patient understands and agrees the plan. Return precautions 7 given. Patient is stable and ready for discharge.  Final Clinical Impressions(s) / ED Diagnoses   Final diagnoses:  Bell's palsy    New Prescriptions Discharge Medication List as of 06/03/2016  4:10 PM    START taking these medications   Details  predniSONE (DELTASONE) 20 MG tablet Take 3 tablets (60 mg total) by mouth daily.,  Starting Fri 06/03/2016, Print    valACYclovir (VALTREX) 1000 MG tablet Take 1 tablet (1,000 mg total) by mouth 3 (three) times daily., Starting Fri 06/03/2016, Until Fri 06/10/2016, Print       I personally performed the services described in this documentation, which was scribed in my presence. The recorded information has been reviewed and is accurate.      Montine Circle, PA-C 06/03/16 [place], MD 06/03/16 [phone]

## 2016-06-16 ENCOUNTER — Emergency Department (HOSPITAL_COMMUNITY)
Admission: EM | Admit: 2016-06-16 | Discharge: 2016-06-16 | Disposition: A | Discharge status: Home / Self Care | Payer: BLUE CROSS/BLUE SHIELD | Attending: Emergency Medicine | Admitting: Emergency Medicine

## 2016-06-16 ENCOUNTER — Encounter (HOSPITAL_COMMUNITY): Payer: Self-pay

## 2016-06-16 DIAGNOSIS — I10 Essential (primary) hypertension: Secondary | ICD-10-CM | POA: Diagnosis not present

## 2016-06-16 DIAGNOSIS — R51 Headache: Secondary | ICD-10-CM | POA: Diagnosis not present

## 2016-06-16 DIAGNOSIS — G44209 Tension-type headache, unspecified, not intractable: Secondary | ICD-10-CM | POA: Diagnosis not present

## 2016-06-16 DIAGNOSIS — Z87891 Personal history of nicotine dependence: Secondary | ICD-10-CM | POA: Diagnosis not present

## 2016-06-16 HISTORY — DX: Bell's palsy: G51.0

## 2016-06-16 MED ORDER — CYCLOBENZAPRINE HCL 10 MG PO TABS: 10.0000 mg | ORAL_TABLET | Freq: Two times a day (BID) | ORAL | 0 refills | Status: DC | PRN

## 2016-06-16 NOTE — ED Triage Notes (Signed)
Per Pt, Pt is coming from home with complaints of right sided head pressure that has been consistent since the patient was diagnosed with Flora Parks's Palsy on 12/29. Pt reports fatigue, Intermittent bilateral arm tingling, and some tingling noted to the right lower leg. Pt reports none of theses symptoms are new after the diagnosis on the 12/29, but patient is not able to wait and see her MD on 1/16.

## 2016-06-16 NOTE — ED Provider Notes (Signed)
[facility] DEPT Provider Note   CSN: [id number] Arrival date & time: 06/16/16  1308     History   Chief Complaint Chief Complaint  Patient presents with  . Headache    HPI Linda Thomas is a 36 y.o. female.  The history is provided by the patient.  Headache   This is a new problem. The current episode started yesterday. The problem occurs constantly. The problem has not changed since onset.The pain is located in the occipital (R occipital) region. Quality: pressure. The pain is moderate. The pain does not radiate. Pertinent negatives include no fever, no chest pressure, no shortness of breath and no nausea. Treatments tried: oxycodnoe. The treatment provided mild relief.  of note patient dx with bells palsy 2 weeks ago and those symptoms improving. No new neuro symptoms.  Past Medical History:  Diagnosis Date  . Bell's palsy   . Hypertension during pregnancy    There are no active problems to display for this patient.   Past Surgical History:  Procedure Laterality Date  . CESAREAN SECTION    . TUBAL LIGATION      OB History    No data available       Home Medications    Prior to Admission medications   Medication Sig Start Date End Date Taking? Authorizing Provider  etonogestrel-ethinyl estradiol (NUVARING) 0.12-0.015 MG/24HR vaginal ring Insert vaginally and leave in place for 3 consecutive weeks, then remove for 1 week. 07/31/15   Janora Norlander, DO  ferrous sulfate 325 (65 FE) MG tablet Take 1 tablet (325 mg total) by mouth daily. Patient not taking: Reported on 07/31/2015 07/12/14   Hyman Bible, PA-C  ibuprofen (ADVIL,MOTRIN) 800 MG tablet Take 1 tablet (800 mg total) by mouth every 8 (eight) hours as needed for fever or moderate pain. 11/06/15   Domenic Moras, PA-C  predniSONE (DELTASONE) 20 MG tablet Take 3 tablets (60 mg total) by mouth daily. 06/03/16   Montine Circle, PA-C    Family History No family history on file.  Social  History Social History  Substance Use Topics  . Smoking status: Former Research scientist (life sciences)  . Smokeless tobacco: Never Used  . Alcohol use Yes     Comment: social     Allergies   Other   Review of Systems Review of Systems  Constitutional: Negative for fever.  Respiratory: Negative for cough and shortness of breath.   Cardiovascular: Negative for chest pain.  Gastrointestinal: Negative for abdominal pain and nausea.  Skin: Negative for rash.  Neurological: Positive for headaches. Negative for speech difficulty, weakness, light-headedness and numbness.       Improving R sided facial droop from bells palsy  All other systems reviewed and are negative.    Physical Exam Updated Vital Signs BP 133/100 (BP Location: Left Arm)   Pulse 83   Temp 98.2 F (36.8 C) (Oral)   Resp 16   Ht 5\' 3"  (1.6 m)   Wt 104.3 kg   SpO2 100%   BMI 40.74 kg/m   Physical Exam  Constitutional: She appears well-developed and well-nourished. No distress.  HENT:  Head: Normocephalic and atraumatic.  Mouth/Throat: Oropharynx is clear and moist.  Eyes: Conjunctivae and EOM are normal. Pupils are equal, round, and reactive to light.  Neck: Neck supple.  Cardiovascular: Normal rate, regular rhythm and intact distal pulses.   No murmur heard. Pulmonary/Chest: Effort normal and breath sounds normal. No respiratory distress. She has no wheezes. She has no rales.  Abdominal: Soft.  There is no tenderness.  Musculoskeletal: She exhibits no edema.  Neurological: She is alert. She has normal strength. No sensory deficit. GCS eye subscore is 4. GCS verbal subscore is 5. GCS motor subscore is 6.  R facial droop involving forehead consistent with continued bells palsy symptoms  Skin: Skin is warm and dry.  Psychiatric: She has a normal mood and affect.  Nursing note and vitals reviewed.    ED Treatments / Results  Labs (all labs ordered are listed, but only abnormal results are displayed) Labs Reviewed - No data  to display  EKG  EKG Interpretation None       Radiology No results found.  Procedures Procedures (including critical care time)  Medications Ordered in ED Medications - No data to display   Initial Impression / Assessment and Plan / ED Course  I have reviewed the triage vital signs and the nursing notes.  Pertinent labs & imaging results that were available during my care of the patient were reviewed by me and considered in my medical decision making (see chart for details).  Clinical Course    Patient is a 36 year old female with history of hypertension and recently diagnosed Bell's palsy who presents with right-sided occipital headache.   Patient describes headache as a pressure sensation and is more painful she lays on that side of her head. Patient took 1 dose of oxycodone yesterday with improvement in symptoms. On exam patient has muscle spasm to the suboccipital musculature on the right side. An occipital release technique was used with improvement in patient's symptoms. Symptoms are likely secondary to a tension headache. Patient is prescribed Flexeril for muscle spasms. She had a benign neurological exam today with no new neurological symptoms. Patient has findings consistent with continued bells palsy on the right side. Doubt any acute intracranial process at this time.   Patient discharged in good condition. She has follow-up with a PCP on this coming Tuesday.  Pt seen with attending Dr. Maryan Rued.  Final Clinical Impressions(s) / ED Diagnoses   Final diagnoses:  Tension headache    New Prescriptions Discharge Medication List as of 06/16/2016  7:09 PM    START taking these medications   Details  cyclobenzaprine (FLEXERIL) 10 MG tablet Take 1 tablet (10 mg total) by mouth 2 (two) times daily as needed for muscle spasms., Starting Thu 06/16/2016, [place], DO 06/16/16 [place], MD 06/16/16 [phone]

## 2016-06-21 ENCOUNTER — Ambulatory Visit (INDEPENDENT_AMBULATORY_CARE_PROVIDER_SITE_OTHER): Payer: BLUE CROSS/BLUE SHIELD | Admitting: Medical

## 2016-06-21 ENCOUNTER — Encounter: Payer: Self-pay | Admitting: Medical

## 2016-06-21 VITALS — BP 126/82 | HR 104 | Ht 64.0 in | Wt 229.8 lb

## 2016-06-21 DIAGNOSIS — Z8679 Personal history of other diseases of the circulatory system: Secondary | ICD-10-CM | POA: Diagnosis not present

## 2016-06-21 DIAGNOSIS — G510 Bell's palsy: Secondary | ICD-10-CM | POA: Insufficient documentation

## 2016-06-21 DIAGNOSIS — I10 Essential (primary) hypertension: Secondary | ICD-10-CM | POA: Insufficient documentation

## 2016-06-21 DIAGNOSIS — G44209 Tension-type headache, unspecified, not intractable: Secondary | ICD-10-CM

## 2016-06-21 NOTE — Progress Notes (Signed)
Subjective: Chief Complaint  Patient presents with  . new pt.    new pt, and  er follow up ,    Here as a new patient.  Hasn't had a PCP in a while, was just using the emergency dept.   Was seen in recent weeks in the ED for headache and bells palsy.    Headache resolved.   She notes the [place] is coming along slowly.   Talking still feels slurred, brushing teeth still is difficult, taste buds on right still feels off.  Has problems with right eye not blinking along with left.  Right ear is sensitive to noise.  Not having headaches regularly, but when she eats pork will get some headaches.     She has hx/o high blood pressure, but not on medication.  She checks her BPs regularly with mom's cuff, and home readings always seems to be normal.   Had hypertension in prior pregnancy only.  Exercise - not much.    Stays at home currently, not working.  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    No diet discretion in particular.   Typically eats once daily.  Doesn't usually eat breakfast.   No other c/o   Past Medical History:  Diagnosis Date  . Bell's palsy   . Hypertension during pregnancy   Current Outpatient Prescriptions on File Prior to Visit  Medication Sig Dispense Refill  . ibuprofen (ADVIL,MOTRIN) 800 MG tablet Take 1 tablet (800 mg total) by mouth every 8 (eight) hours as needed for fever or moderate pain. 21 tablet 0   No current facility-administered medications on file prior to visit.    ROS as in subjective   Objective: BP 126/82   Pulse (!) 104   Ht 5\' 4"  (1.626 m)   Wt 229 lb 12.8 oz (104.2 kg)   SpO2 97%   BMI 39.45 kg/m   BP Readings from Last 3 Encounters:  06/21/16 126/82  06/16/16 121/86  06/03/16 143/91   Wt Readings from Last 3 Encounters:  06/21/16 229 lb 12.8 oz (104.2 kg)  06/16/16 230 lb (104.3 kg)  11/06/15 227 lb 3 oz (103.1 kg)     General appearance: alert, no distress, WD/WN, obese AA female Right face with facial weakness with eye  lid, forehead, cheek, loss of nasolabial fold, reduced strength with right sided facial motion in general, unable to fully close right eyelids with resistance, but otherwise PERRLA, EOMi, rest of neuro exam unremarkable HEENT: normocephalic, sclerae anicteric, nares patent, no discharge or erythema, pharynx normal Oral cavity: MMM, no lesions Neck: supple, no lymphadenopathy, no thyromegaly, no masses, no bruits Heart: mildly tachycardic, otherwise RRR, normal S1, S2, no murmurs Lungs: CTA bilaterally, no wheezes, rhonchi, or rales Extremities: no edema, no cyanosis, no clubbing Pulses: 2+ symmetric, upper and lower extremities, normal cap refill Psychiatric: normal affect, behavior normal, pleasant     Assessment: Encounter Diagnoses  Name Primary?  . Bell's palsy Yes  . History of hypertension   . Tension headache      Plan: Bell's Palsy - discussed symptoms, diagnosis, treatment.   She has completed a course of steroid and antiviral at this point.   Advised facial exercises and self facial massage as we discussed, advised protesting the eye with wetting drops throughout the day and possibly throughout the night, gave paper tape and eye patches she can have her teenage children help apply to keep the eye closed at night.  Discussed that 70% of patients  resolve within 3 weeks but 30% don't, so we have to give it some time.   Answered her questions  Advised she start to cut way back on soda, start working on healthier lifestyle, getting some exercise  F/u soon for a physical and fasting labs.   discussed the purpose of physical visit.     If not seeing improvement with bells in the next 2 weeks, call or return.  May end up getting eye doctor and PT involved.

## 2016-06-21 NOTE — Patient Instructions (Signed)
Bell Palsy Bell palsy is a condition in which the muscles on one side of the face become paralyzed. This often causes one side of the face to droop. It is a common condition and most people recover completely. RISK FACTORS Risk factors for Bell palsy include:  Pregnancy.  Diabetes.  An infection by a virus, such as infections that cause cold sores. CAUSES  Bell palsy is caused by damage to or inflammation of a nerve in your face. It is unclear why this happens, but an infection by a virus may lead to it. Most of the time the reason it happens is unknown. SIGNS AND SYMPTOMS  Symptoms can range from mild to severe and can take place over a number of hours. Symptoms may include:  Being unable to:  Raise one or both eyebrows.  Close one or both eyes.  Feel parts of your face (facial numbness).  Drooping of the eyelid and corner of the mouth.  Weakness in the face.  Paralysis of half your face.  Loss of taste.  Sensitivity to loud noises.  Difficulty chewing.  Tearing up of the affected eye.  Dryness in the affected eye.  Drooling.  Pain behind one ear. DIAGNOSIS  Diagnosis of Bell palsy may include:  A medical history and physical exam.  An MRI.  A CT scan.  Electromyography (EMG). This is a test that checks how your nerves are working. TREATMENT  Treatment may include antiviral medicine to help shorten the length of the condition. Sometimes treatment is not needed and the symptoms go away on their own. HOME CARE INSTRUCTIONS   Take medicines only as directed by your health care provider.  Do facial massages and exercises as directed by your health care provider.  If your eye is affected:  Use moisturizing eye drops (THERATEARS) to prevent drying of your eye as directed by your health care provider.  Protect your eye as directed by your health care provider. SEEK MEDICAL CARE IF:  Your symptoms do not get better or get worse.  You are drooling.  Your  eye is red, irritated, or hurts. SEEK IMMEDIATE MEDICAL CARE IF:   Another part of your body feels weak or numb.  You have difficulty swallowing.  You have a fever along with symptoms of Bell palsy.  You develop neck pain. MAKE SURE YOU:   Understand these instructions.  Will watch your condition.  Will get help right away if you are not doing well or get worse. This information is not intended to replace advice given to you by your health care provider. Make sure you discuss any questions you have with your health care provider. Document Released: 05/23/2005 Document Revised: 02/11/2015 Document Reviewed: 08/30/2013 Elsevier Interactive Patient Education  2017 [employer].

## 2016-08-29 ENCOUNTER — Encounter (HOSPITAL_COMMUNITY): Payer: Self-pay | Admitting: Emergency Medicine

## 2016-08-29 ENCOUNTER — Emergency Department (HOSPITAL_COMMUNITY): Payer: BLUE CROSS/BLUE SHIELD

## 2016-08-29 ENCOUNTER — Emergency Department (HOSPITAL_COMMUNITY)
Admission: EM | Admit: 2016-08-29 | Discharge: 2016-08-30 | Disposition: A | Discharge status: Home / Self Care | Payer: BLUE CROSS/BLUE SHIELD | Attending: Emergency Medicine | Admitting: Emergency Medicine

## 2016-08-29 DIAGNOSIS — H9201 Otalgia, right ear: Secondary | ICD-10-CM | POA: Diagnosis not present

## 2016-08-29 DIAGNOSIS — I10 Essential (primary) hypertension: Secondary | ICD-10-CM | POA: Diagnosis not present

## 2016-08-29 DIAGNOSIS — R0789 Other chest pain: Secondary | ICD-10-CM | POA: Insufficient documentation

## 2016-08-29 DIAGNOSIS — Z87891 Personal history of nicotine dependence: Secondary | ICD-10-CM | POA: Insufficient documentation

## 2016-08-29 DIAGNOSIS — R079 Chest pain, unspecified: Secondary | ICD-10-CM | POA: Diagnosis not present

## 2016-08-29 DIAGNOSIS — Z79899 Other long term (current) drug therapy: Secondary | ICD-10-CM | POA: Diagnosis not present

## 2016-08-29 LAB — CBC
HEMATOCRIT: 32 % — AB (ref 36.0–46.0)
HEMOGLOBIN: 9.5 g/dL — AB (ref 12.0–15.0)
MCH: 23.4 pg — ABNORMAL LOW (ref 26.0–34.0)
MCHC: 29.7 g/dL — ABNORMAL LOW (ref 30.0–36.0)
MCV: 78.8 fL (ref 78.0–100.0)
Platelets: 226 10*3/uL (ref 150–400)
RBC: 4.06 MIL/uL (ref 3.87–5.11)
RDW: 18.8 % — AB (ref 11.5–15.5)
WBC: 5.9 10*3/uL (ref 4.0–10.5)

## 2016-08-29 LAB — BASIC METABOLIC PANEL
ANION GAP: 9 (ref 5–15)
BUN: 14 mg/dL (ref 6–20)
CHLORIDE: 105 mmol/L (ref 101–111)
CO2: 25 mmol/L (ref 22–32)
Calcium: 8.9 mg/dL (ref 8.9–10.3)
Creatinine, Ser: 0.77 mg/dL (ref 0.44–1.00)
GFR calc Af Amer: >60 mL/min (ref >60)
GLUCOSE: 133 mg/dL — AB (ref 65–99)
POTASSIUM: 3.8 mmol/L (ref 3.5–5.1)
Sodium: 139 mmol/L (ref 135–145)

## 2016-08-29 LAB — I-STAT TROPONIN, ED: Troponin i, poc: 0 ng/mL (ref 0.00–0.08)

## 2016-08-29 MED ORDER — IBUPROFEN 800 MG PO TABS
800.0000 mg | ORAL_TABLET | Freq: Once | ORAL | Status: AC
  Administered 2016-08-29: 800 mg via ORAL
  Filled 2016-08-29: qty 1

## 2016-08-29 NOTE — Discharge Instructions (Signed)
You may alternate Tylenol 1000 mg every 6 hours as needed for pain and Ibuprofen 800 mg every 8 hours as needed for pain.  Please take Ibuprofen with food. ° °

## 2016-08-29 NOTE — ED Triage Notes (Addendum)
Patient reports intermittent left upper chest pain onset last night , denies SOB , no nausea or diaphoresis , denies chest pain at arrival , pt. added chronic right ear ache since December last year .

## 2016-08-29 NOTE — ED Provider Notes (Signed)
By signing my name below, I, Avnee Patel, attest that this documentation has been prepared under the direction and in the presence of [place], DO  Electronically Signed: Delton [place], ED Scribe. 08/29/16. 11:32 PM.  TIME SEEN: 11:24 PM  CHIEF COMPLAINT:  Chief Complaint  Patient presents with  . Chest Pain  . Otalgia    HPI:   Linda Thomas is a 36 y.o. female, with a PMHx of HTN and recent Bell's Palsy in December, who presents to the Emergency Department complaining of acute onset, intermittent, non-radiating, moderate, left sided sharp chest pain x 10 PM yesterday. She notes her episodes of pain lasts for about 1 minute. Nothing makes her pain better or worse. Not exertional or pleuritic.  No fever, cough.  She also reports dizziness which she believes is secondary to HTN and intermittent right ear pain which she describes as a pressure sensation x several months. She notes her ear pain began after she was diagnosed with Bell's Palsy on 06/03/2016. No alleviating factors noted. She denies SOB, nausea, vomiting, diaphoresis, hematochezia, melena, fevers, cough, abdominal pain or any other associated symptoms. Pt also denies a hx of DM, high cholesterol.  No history of PE, DVT, exogenous estrogen use, recent fractures, surgery, trauma, hospitalization or prolonged travel. No lower extremity swelling or pain. No calf tenderness.   ROS: See HPI Constitutional: no fever  Eyes: no drainage  ENT: no runny nose   Cardiovascular:  + chest pain  Resp: no SOB  GI: no vomiting GU: no dysuria Integumentary: no rash  Allergy: no hives  Musculoskeletal: no leg swelling  Neurological: no slurred speech ROS otherwise negative  PAST MEDICAL HISTORY/PAST SURGICAL HISTORY:  Past Medical History:  Diagnosis Date  . Bell's palsy   . Hypertension during pregnancy    MEDICATIONS:  Prior to Admission medications   Medication Sig Start Date End Date Taking? Authorizing Provider   ibuprofen (ADVIL,MOTRIN) 800 MG tablet Take 1 tablet (800 mg total) by mouth every 8 (eight) hours as needed for fever or moderate pain. 11/06/15   Domenic Moras, PA-C    ALLERGIES:  Allergies  Allergen Reactions  . Other Itching    Reaction to unknown antibiotic at age 37  . Penicillins     SOCIAL HISTORY:  Social History  Substance Use Topics  . Smoking status: Former Research scientist (life sciences)  . Smokeless tobacco: Never Used  . Alcohol use Yes     Comment: social    FAMILY HISTORY: No family history on file.  EXAM: BP (!) 152/96 (BP Location: Left Arm)   Pulse 89   Temp 97.5 F (36.4 C) (Oral)   Resp 19   Ht 5' 3.5" (1.613 m)   Wt 233 lb (105.7 kg)   LMP 08/09/2016 (Approximate)   SpO2 99%   BMI 40.63 kg/m  CONSTITUTIONAL: Alert and oriented and responds appropriately to questions. Well-appearing; well-nourished HEAD: Normocephalic EYES: Conjunctivae clear, pupils appear equal, EOMI ENT: normal nose; moist mucous membranes; TMs are clear bilaterally without erythema, purulence, bulging, perforation, effusion.  No cerumen impaction or sign of foreign body in the external auditory canal. No inflammation, erythema or drainage from the external auditory canal. No signs of mastoiditis. No pain with manipulation of the pinna bilaterally.  No pharyngeal erythema or petechiae, no tonsillar hypertrophy or exudate, no uvular deviation, no unilateral swelling, no trismus or drooling, no muffled voice, normal phonation, no stridor, no dental caries present, no drainable dental abscess noted, no Ludwig's angina, tongue sits  flat in the bottom of the mouth, no angioedema, no facial erythema or warmth, no facial swelling; no pain with movement of the neck. NECK: Supple, no meningismus, no nuchal rigidity, no LAD  CARD: RRR; S1 and S2 appreciated; no murmurs, no clicks, no rubs, no gallops CHEST:  Chest wall is nontender to palpation.  No crepitus, ecchymosis, erythema, warmth, rash or other lesions present.    RESP: Normal chest excursion without splinting or tachypnea; breath sounds clear and equal bilaterally; no wheezes, no rhonchi, no rales, no hypoxia or respiratory distress, speaking full sentences ABD/GI: Normal bowel sounds; non-distended; soft, non-tender, no rebound, no guarding, no peritoneal signs, no hepatosplenomegaly BACK:  The back appears normal and is non-tender to palpation, there is no CVA tenderness EXT: Normal ROM in all joints; non-tender to palpation; no edema; normal capillary refill; no cyanosis, no calf tenderness or swelling    SKIN: Normal color for age and race; warm; no rash NEURO: Moves all extremities equally, No facial droop, normal speech PSYCH: The patient's mood and manner are appropriate. Grooming and personal hygiene are appropriate.  MEDICAL DECISION MAKING: Patient here with atypical chest pain. Her heart score is 1.  Symptoms present and intermittent since last night. Has a negative troponin here. EKG shows no ischemic abnormality and is unchanged compared to previous. She is PERC negative. Have recommended alternating Tylenol and ibuprofen. I do not feel she needs further workup including serial enzymes at this time, hospitalization. She can follow-up with her primary care physician as outpatient and she is comfortable with this plan.  As for her right ear pain, there is no sign of otitis media, otitis externa, mastoiditis. No perforation or effusion. She reports this has been present since her Bell's palsy but is getting better. I do not feel she needs antibiotics or further workup at this time. Again this can be followed up by her PCP.   At this time, I do not feel there is any life-threatening condition present. I have reviewed and discussed all results (EKG, imaging, lab, urine as appropriate) and exam findings with patient/family. I have reviewed nursing notes and appropriate previous records.  I feel the patient is safe to be discharged home without further  emergent workup and can continue workup as an outpatient as needed. Discussed usual and customary return precautions. Patient/family verbalize understanding and are comfortable with this plan.  Outpatient follow-up has been provided if needed. All questions have been answered.    EKG Interpretation  Date/Time:  Monday August 29 2016 20:34:29 EDT Ventricular Rate:  97 PR Interval:  150 QRS Duration: 88 QT Interval:  350 QTC Calculation: 444 R Axis:   81 Text Interpretation:  Normal sinus rhythm Nonspecific T wave abnormality Abnormal ECG No significant change since last tracing Confirmed by Haylo Fake,  DO, Zyrion Coey [phone]) on 08/29/2016 11:07:20 PM       I personally performed the services described in this documentation, which was scribed in my presence. The recorded information has been reviewed and is accurate.    [place], DO 08/30/16 0010

## 2016-09-19 ENCOUNTER — Telehealth: Payer: Self-pay

## 2016-09-19 ENCOUNTER — Ambulatory Visit: Payer: BLUE CROSS/BLUE SHIELD | Admitting: Medical

## 2016-09-19 NOTE — Telephone Encounter (Signed)
d 

## 2016-09-19 NOTE — Telephone Encounter (Signed)

## 2016-09-21 ENCOUNTER — Telehealth: Payer: Self-pay | Admitting: Medical
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
## 2016-11-17 ENCOUNTER — Encounter (HOSPITAL_COMMUNITY): Payer: Self-pay

## 2016-11-17 ENCOUNTER — Emergency Department (HOSPITAL_COMMUNITY)
Admission: EM | Admit: 2016-11-17 | Discharge: 2016-11-17 | Disposition: A | Discharge status: Home / Self Care | Payer: No Typology Code available for payment source | Attending: Emergency Medicine | Admitting: Emergency Medicine

## 2016-11-17 DIAGNOSIS — Z87891 Personal history of nicotine dependence: Secondary | ICD-10-CM | POA: Insufficient documentation

## 2016-11-17 DIAGNOSIS — Z79899 Other long term (current) drug therapy: Secondary | ICD-10-CM | POA: Insufficient documentation

## 2016-11-17 DIAGNOSIS — R002 Palpitations: Secondary | ICD-10-CM | POA: Diagnosis not present

## 2016-11-17 DIAGNOSIS — I10 Essential (primary) hypertension: Secondary | ICD-10-CM

## 2016-11-17 LAB — BASIC METABOLIC PANEL
Anion gap: 10 (ref 5–15)
BUN: 12 mg/dL (ref 6–20)
CHLORIDE: 104 mmol/L (ref 101–111)
CO2: 23 mmol/L (ref 22–32)
Calcium: 9.1 mg/dL (ref 8.9–10.3)
Creatinine, Ser: 0.67 mg/dL (ref 0.44–1.00)
GFR calc non Af Amer: >60 mL/min (ref >60)
Glucose, Bld: 108 mg/dL — ABNORMAL HIGH (ref 65–99)
POTASSIUM: 3.8 mmol/L (ref 3.5–5.1)
Sodium: 137 mmol/L (ref 135–145)

## 2016-11-17 LAB — CBC
HEMATOCRIT: 35.3 % — AB (ref 36.0–46.0)
Hemoglobin: 10.8 g/dL — ABNORMAL LOW (ref 12.0–15.0)
MCH: 23.9 pg — AB (ref 26.0–34.0)
MCHC: 30.6 g/dL (ref 30.0–36.0)
MCV: 78.3 fL (ref 78.0–100.0)
PLATELETS: 251 10*3/uL (ref 150–400)
RBC: 4.51 MIL/uL (ref 3.87–5.11)
RDW: 16.9 % — ABNORMAL HIGH (ref 11.5–15.5)
WBC: 5.6 10*3/uL (ref 4.0–10.5)

## 2016-11-17 LAB — MAGNESIUM: MAGNESIUM: 1.8 mg/dL (ref 1.7–2.4)

## 2016-11-17 LAB — I-STAT TROPONIN, ED: Troponin i, poc: 0 ng/mL (ref 0.00–0.08)

## 2016-11-17 LAB — I-STAT BETA HCG BLOOD, ED (MC, WL, AP ONLY): I-stat hCG, quantitative: <5 m[IU]/mL (ref <5)

## 2016-11-17 LAB — TSH: TSH: 5.068 u[IU]/mL — AB (ref 0.350–4.500)

## 2016-11-17 LAB — T4, FREE: FREE T4: 0.86 ng/dL (ref 0.61–1.12)

## 2016-11-17 MED ORDER — HYDROCHLOROTHIAZIDE 25 MG PO TABS: 25.0000 mg | ORAL_TABLET | Freq: Every day | ORAL | 0 refills | Status: DC

## 2016-11-17 NOTE — Discharge Instructions (Signed)
Need to follow up with her primary care physician in order to get a Holter monitor for your fast heart beats that you have been feeling. Your blood pressure was elevated in the emergency department, given her history of high blood pressure will start you on a blood pressure medication.

## 2016-11-17 NOTE — ED Provider Notes (Signed)
[facility] DEPT Provider Note   CSN: [id number] Arrival date & time: 11/17/16  2585     History   Chief Complaint Chief Complaint  Patient presents with  . Fatigue    HPI Linda Thomas is a 36 y.o. female.  HPI  Patient is a 36 year old female with no significant past medical history who presents to the emergency department with intermittent heart palpitations and generalized fatigue over the past 1 month. Endorses feeling like her heart is racing at rest, worse at nighttime. Associated with mild "feeling like I need to take a big deep breath" with heart palpitations began. Denies chest pain, syncope, headache, fever, chills, nausea, vomiting, abdominal pain. Nothing improves or worsens for heart palpitations. Denies exertional syncope or presyncope. No family history of sudden cardiac death. Endorses some stress over the past 1 month. Dorsalis is generalized fatigue and "I just feel completely worn out and like my heart palpitations take everything out of me". Patient established a primary care physician when she had Bell's palsy in January but missed her follow-up appointment.  Past Medical History:  Diagnosis Date  . Bell's palsy   . Hypertension during pregnancy    Patient Active Problem List   Diagnosis Date Noted  . Bell's palsy 06/21/2016  . History of hypertension 06/21/2016    Past Surgical History:  Procedure Laterality Date  . CESAREAN SECTION    . TUBAL LIGATION      OB History    No data available       Home Medications    Prior to Admission medications   Medication Sig Start Date End Date Taking? Authorizing Provider  ibuprofen (ADVIL,MOTRIN) 200 MG tablet Take 200 mg by mouth every 6 (six) hours as needed for moderate pain.   Yes [provider]  hydrochlorothiazide (HYDRODIURIL) 25 MG tablet Take 1 tablet (25 mg total) by mouth daily. 11/17/16 12/17/16  Nathaniel Man, MD    Family History No family history on file.  Social  History Social History  Substance Use Topics  . Smoking status: Former Research scientist (life sciences)  . Smokeless tobacco: Never Used  . Alcohol use Yes     Comment: social     Allergies   Other and Penicillins   Review of Systems Review of Systems  Constitutional: Positive for fatigue. Negative for appetite change and fever.  HENT: Negative for congestion.   Respiratory: Negative for cough, chest tightness and shortness of breath.   Cardiovascular: Positive for palpitations. Negative for chest pain.  Gastrointestinal: Negative for abdominal pain, diarrhea and vomiting.  Genitourinary: Negative for dysuria, flank pain and hematuria.  Musculoskeletal: Negative for back pain.  Skin: Negative for rash.  Neurological: Negative for dizziness, seizures, weakness and light-headedness.  Psychiatric/Behavioral: Negative for behavioral problems.     Physical Exam Updated Vital Signs BP (!) 141/95   Pulse 88   Temp 99 F (37.2 C)   Resp 17   SpO2 98%   Physical Exam  Constitutional: She is oriented to person, place, and time. She appears well-developed and well-nourished. No distress.  HENT:  Head: Atraumatic.  Mouth/Throat: Oropharynx is clear and moist.  Eyes: EOM are normal. Pupils are equal, round, and reactive to light.  Neck: Normal range of motion. Neck supple.  Cardiovascular: Normal rate, regular rhythm, normal heart sounds and intact distal pulses.   No murmur heard. Pulmonary/Chest: Effort normal and breath sounds normal. No respiratory distress. She has no wheezes.  Abdominal: There is no tenderness.  Musculoskeletal: Normal range of  motion.  No unilateral leg swelling, no tenderness to palpation.  Neurological: She is alert and oriented to person, place, and time.  Skin: Skin is warm. Capillary refill takes less than 2 seconds. No pallor.  Psychiatric: She has a normal mood and affect.     ED Treatments / Results  Labs (all labs ordered are listed, but only abnormal results are  displayed) Labs Reviewed  BASIC METABOLIC PANEL - Abnormal; Notable for the following:       Result Value   Glucose, Bld 108 (*)    All other components within normal limits  CBC - Abnormal; Notable for the following:    Hemoglobin 10.8 (*)    HCT 35.3 (*)    MCH 23.9 (*)    RDW 16.9 (*)    All other components within normal limits  TSH - Abnormal; Notable for the following:    TSH 5.068 (*)    All other components within normal limits  MAGNESIUM  T4, FREE  C-REACTIVE PROTEIN  SEDIMENTATION RATE  I-STAT BETA HCG BLOOD, ED ([facility], [facility], [facility] ONLY)  I-STAT TROPOININ, ED    EKG  EKG Interpretation  Date/Time:  Thursday November 17 2016 09:49:26 EDT Ventricular Rate:  93 PR Interval:  142 QRS Duration: 88 QT Interval:  386 QTC Calculation: 479 R Axis:   10 Text Interpretation:  Normal sinus rhythm Normal ECG agree. no change from previous Confirmed by Charlesetta Shanks [phone]) on 11/17/2016 9:54:52 AM       Radiology No results found.  Procedures Procedures (including critical care time)  Medications Ordered in ED Medications - No data to display   Initial Impression / Assessment and Plan / ED Course  I have reviewed the triage vital signs and the nursing notes.  Pertinent labs & imaging results that were available during my care of the patient were reviewed by me and considered in my medical decision making (see chart for details).     Patient is a 36 year old female with no significant past medical history presents to the emergency department with a one-month history of intermittent heart palpitations 15 with generalized fatigue. Denies chest pain or other systemic symptoms. On arrival distress, not ill appearing. Afebrile, hemodynamically stable.  EKG showed normal sinus rhythm, PR 142, QTC 479, no chamber enlargement, no signs of acute ischemia. Borderline prolonged QTC. Patient is not on any prolonging QTC medications. Normal PR without signs of WPW. No signs of HOCM, no  family history of sudden cardiac death. Chest x-ray showed no acute findings. Lab workwith no significant electrolyte abnormalities. No leukocytosis. TSH 5.0, free T4 within normal limits. Doubt PE, low risk Well's criteria, no dyspnea, tachypnea, or pleuritic chest pain.   Uncertain of the patient's etiology of her heart palpitations. Patient has an established primary care physician. Discussed that she needs to follow-up with her primary care physician and patient needs a cardiac Holter monitor. Given a prescription for HCTZ given unmonitored hypertension. Given strict return precautions to the emergency department. Patient expressed understanding, no questions or concerns at time of discharge.  Final Clinical Impressions(s) / ED Diagnoses   Final diagnoses:  Palpitations  Hypertension, unspecified type    New Prescriptions New Prescriptions   HYDROCHLOROTHIAZIDE (HYDRODIURIL) 25 MG TABLET    Take 1 tablet (25 mg total) by mouth daily.     Nathaniel Man, MD 11/17/16 0254    Charlesetta Shanks, MD 11/17/16 1723

## 2016-11-17 NOTE — ED Provider Notes (Signed)
I saw and evaluated the patient, reviewed the resident's note and I agree with the findings and plan.   EKG Interpretation  Date/Time:  Thursday November 17 2016 09:49:26 EDT Ventricular Rate:  93 PR Interval:  142 QRS Duration: 88 QT Interval:  386 QTC Calculation: 479 R Axis:   10 Text Interpretation:  Normal sinus rhythm Normal ECG agree. no change from previous Confirmed by Charlesetta Shanks [phone]) on 11/17/2016 9:54:52 AM     Patient reports generalized fatigue. This is been going on for a month. She reports she has also been experiencing palpitations. Her heart starts to feel like is racing. This occurs most frequently and is most pronounced at night when she is trying to sleep. Patient denies any use of OTC medications, diet supplements, drug use, significant caffeine consumption. Patient has not been monitoring for hypertension for a number of years. He had hypertension during pregnancy many years previously but has not been getting routine monitoring and only recently started seeing a PCP.  Patient is alert and nontoxic. No respiratory distress. Moderate obesity. Pupils symmetric and responsive to light. Extraocular motions intact. Neck supple without thyromegaly. Heart regular without rub murmur gallop. Lungs clear without wheeze or rale. Abdomen soft nontender. No peripheral edema. Calves Soft nontender.  Patient is counseled on outpatient follow-up for Holter monitoring. She is counseled on hypertension and started on HCTZ. At this time, patient is stable for continued outpatient diagnostic evaluation and management. I agree with plan of management   Charlesetta Shanks, MD 11/17/16 1236

## 2016-11-17 NOTE — ED Triage Notes (Signed)
Patient complains of months of palpitations and fatigue. States that she is tired of feeling weak, has palpitations intermittently. NAD

## 2017-02-16 ENCOUNTER — Emergency Department (HOSPITAL_COMMUNITY)
Admission: EM | Admit: 2017-02-16 | Discharge: 2017-02-16 | Disposition: A | Discharge status: Home / Self Care | Payer: BLUE CROSS/BLUE SHIELD | Attending: Emergency Medicine | Admitting: Emergency Medicine

## 2017-02-16 ENCOUNTER — Encounter (HOSPITAL_COMMUNITY): Payer: Self-pay | Admitting: Emergency Medicine

## 2017-02-16 DIAGNOSIS — Z87891 Personal history of nicotine dependence: Secondary | ICD-10-CM | POA: Insufficient documentation

## 2017-02-16 DIAGNOSIS — G5601 Carpal tunnel syndrome, right upper limb: Secondary | ICD-10-CM | POA: Diagnosis not present

## 2017-02-16 DIAGNOSIS — I10 Essential (primary) hypertension: Secondary | ICD-10-CM | POA: Insufficient documentation

## 2017-02-16 DIAGNOSIS — M25531 Pain in right wrist: Secondary | ICD-10-CM | POA: Diagnosis not present

## 2017-02-16 MED ORDER — HYDROCHLOROTHIAZIDE 25 MG PO TABS
25.0000 mg | ORAL_TABLET | Freq: Every day | ORAL | Status: DC
  Administered 2017-02-16: 25 mg via ORAL
  Filled 2017-02-16: qty 1

## 2017-02-16 MED ORDER — METHYLPREDNISOLONE 4 MG PO TBPK: ORAL_TABLET | ORAL | 0 refills | Status: DC

## 2017-02-16 MED ORDER — HYDROCHLOROTHIAZIDE 25 MG PO TABS: 25.0000 mg | ORAL_TABLET | Freq: Every day | ORAL | 0 refills | Status: DC

## 2017-02-16 NOTE — Discharge Instructions (Signed)
Please read attached information regarding your condition. Wear wrist splint as much as possible but especially at night. Take steroids in tapered dose as directed. Continue hydrochlorothiazide blood pressure medication daily. Follow-up at [facility] for further evaluation. Return to ED for worsening pain, injuries, signs of infected joint including redness or warmth or joint, numbness on one side of body, head injury.

## 2017-02-16 NOTE — ED Provider Notes (Signed)
[facility] DEPT Provider Note   CSN: [id number] Arrival date & time: 02/16/17  1152     History   Chief Complaint Chief Complaint  Patient presents with  . Numbness  . Arm Pain    HPI Linda Thomas is a 36 y.o. female.  HPI  Patient with a past medical history of hypertension, presents to ED for right sided wrist and sharp shooting pain radiating up arm for the past several years. States that it has worsened in the past few weeks. 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 She has tried Tylenol and ibuprofen with some relief in her symptoms. She is right-handed. She denies any falls, injuries, weakness, headache, vision changes, facial asymmetry, nausea or vomiting. States she does take her blood pressure medications daily but has not taken it today.  Past Medical History:  Diagnosis Date  . Bell's palsy   . Hypertension during pregnancy    Patient Active Problem List   Diagnosis Date Noted  . Bell's palsy 06/21/2016  . History of hypertension 06/21/2016    Past Surgical History:  Procedure Laterality Date  . CESAREAN SECTION    . TUBAL LIGATION      OB History    No data available       Home Medications    Prior to Admission medications   Medication Sig Start Date End Date Taking? Authorizing Provider  hydrochlorothiazide (HYDRODIURIL) 25 MG tablet Take 1 tablet (25 mg total) by mouth daily. 02/16/17   Donye Dauenhauer, PA-C  ibuprofen (ADVIL,MOTRIN) 200 MG tablet Take 200 mg by mouth every 6 (six) hours as needed for moderate pain.    [provider]  methylPREDNISolone (MEDROL DOSEPAK) 4 MG TBPK tablet Taper over 6 days. 02/16/17   [name], PA-C    Family History History reviewed. No pertinent family history.  Social History Social History  Substance Use Topics  . Smoking status: Former Research scientist (life sciences)  . Smokeless tobacco: Never Used  . Alcohol use Yes     Comment: social     Allergies     Other and Penicillins   Review of Systems Review of Systems  Constitutional: Negative for chills and fever.  Eyes: Negative for photophobia and visual disturbance.  Respiratory: Negative for shortness of breath.   Gastrointestinal: Negative for nausea and vomiting.  Musculoskeletal: Positive for arthralgias and myalgias. Negative for back pain, gait problem and joint swelling.  Skin: Negative for rash.  Neurological: Negative for weakness, numbness and headaches.     Physical Exam Updated Vital Signs BP (!) 140/103 (BP Location: Right Arm)   Pulse 84   Temp 98.5 F (36.9 C) (Oral)   Resp 17   SpO2 99%   Physical Exam  Constitutional: She appears well-developed and well-nourished. No distress.  HENT:  Head: Normocephalic and atraumatic.  Eyes: Conjunctivae and EOM are normal. No scleral icterus.  Neck: Normal range of motion.  Pulmonary/Chest: Effort normal. No respiratory distress.  Musculoskeletal: Normal range of motion. She exhibits no edema, tenderness or deformity.  Positive Phalen's and Tinel's test. Equal grip strength bilaterally. No visible swelling, deformity or color change noted in right wrist. Full active and passive range of motion of wrist. Sensation intact to light touch.  Neurological: She is alert.  Skin: No rash noted. She is not diaphoretic.  Psychiatric: She has a normal mood and affect.  Nursing note and vitals reviewed.    ED Treatments / Results  Labs (all labs ordered are listed, but only abnormal results are displayed) Labs Reviewed - No data to display  EKG  EKG Interpretation None       Radiology No results found.  Procedures Procedures (including critical care time)  Medications Ordered in ED Medications  hydrochlorothiazide (HYDRODIURIL) tablet 25 mg (not administered)     Initial Impression / Assessment and Plan / ED Course  I have reviewed the triage vital signs and the nursing notes.  Pertinent labs & imaging  results that were available during my care of the patient were reviewed by me and considered in my medical decision making (see chart for details).     Patient presents to ED for evaluation of right wrist pain radiating up her arm. Symptoms been going on for the past few years but have worsened in the past few weeks. On physical exam there is no joint swelling, color or temperature change that would concern me for septic joint. She does have a positive Tinel and Phalen's test and considering that she does use her hands to do hair, I suspect that her symptoms are most likely due to carpal tunnel syndrome, less likely neurological cause for these symptoms. Will give steroids in place and wrist splint. Patient is hypertensive here in the ED to 140s over 100. States she has not taken her HCTZ today. We'll give patient one dose here in the ED and will give refill for the next month. Patient encouraged to follow up with [facility] and wellness for further evaluation as she states that she does not currently have a PCP. Patient appears stable for discharge at this time. Strict return precautions given.   Final Clinical Impressions(s) / ED Diagnoses   Final diagnoses:  Carpal tunnel syndrome of right wrist    New Prescriptions New Prescriptions   HYDROCHLOROTHIAZIDE (HYDRODIURIL) 25 MG TABLET    Take 1 tablet (25 mg total) by mouth daily.   METHYLPREDNISOLONE (MEDROL DOSEPAK) 4 MG TBPK TABLET    Taper over 6 days.     [name], PA-C 02/16/17 1409    Julianne Rice, MD 02/17/17 [phone]

## 2017-02-16 NOTE — ED Triage Notes (Addendum)
PT reports numbness in R arm going down to hand; also occurs in L hand but worse in right at this time; pain is pretty constant and is worse in mornings. Reports more pain at night that wakes her up. Takes tylenol and ibuprofen but not helping much. Reports weakness in gripping that comes and goes. Burning sensation in arms. Neuro intact; normal sensation. Grips equal; normal gait.

## 2017-02-16 NOTE — ED Notes (Signed)
Nurse Benjamine Mola said no need to do a EKG

## 2017-02-20 ENCOUNTER — Emergency Department (HOSPITAL_COMMUNITY)
Admission: EM | Admit: 2017-02-20 | Discharge: 2017-02-20 | Disposition: A | Discharge status: Home / Self Care | Payer: BLUE CROSS/BLUE SHIELD | Attending: Emergency Medicine | Admitting: Emergency Medicine

## 2017-02-20 ENCOUNTER — Encounter (HOSPITAL_COMMUNITY): Payer: Self-pay | Admitting: *Deleted

## 2017-02-20 DIAGNOSIS — R5383 Other fatigue: Secondary | ICD-10-CM | POA: Diagnosis not present

## 2017-02-20 DIAGNOSIS — Z87891 Personal history of nicotine dependence: Secondary | ICD-10-CM | POA: Diagnosis not present

## 2017-02-20 DIAGNOSIS — Z79899 Other long term (current) drug therapy: Secondary | ICD-10-CM | POA: Diagnosis not present

## 2017-02-20 DIAGNOSIS — I10 Essential (primary) hypertension: Secondary | ICD-10-CM | POA: Diagnosis not present

## 2017-02-20 LAB — URINALYSIS, ROUTINE W REFLEX MICROSCOPIC
BACTERIA UA: NONE SEEN
Bilirubin Urine: NEGATIVE
Glucose, UA: NEGATIVE mg/dL
Ketones, ur: NEGATIVE mg/dL
Leukocytes, UA: NEGATIVE
Nitrite: NEGATIVE
PROTEIN: NEGATIVE mg/dL
Specific Gravity, Urine: 1.024 (ref 1.005–1.030)
pH: 5 (ref 5.0–8.0)

## 2017-02-20 LAB — BASIC METABOLIC PANEL
ANION GAP: 7 (ref 5–15)
BUN: 15 mg/dL (ref 6–20)
CALCIUM: 9 mg/dL (ref 8.9–10.3)
CO2: 26 mmol/L (ref 22–32)
Chloride: 102 mmol/L (ref 101–111)
Creatinine, Ser: 0.76 mg/dL (ref 0.44–1.00)
GFR calc Af Amer: >60 mL/min (ref >60)
GFR calc non Af Amer: >60 mL/min (ref >60)
GLUCOSE: 98 mg/dL (ref 65–99)
Potassium: 3.6 mmol/L (ref 3.5–5.1)
Sodium: 135 mmol/L (ref 135–145)

## 2017-02-20 LAB — POC URINE PREG, ED: PREG TEST UR: NEGATIVE

## 2017-02-20 LAB — CBC
HEMATOCRIT: 36.5 % (ref 36.0–46.0)
HEMOGLOBIN: 11.4 g/dL — AB (ref 12.0–15.0)
MCH: 25.2 pg — AB (ref 26.0–34.0)
MCHC: 31.2 g/dL (ref 30.0–36.0)
MCV: 80.8 fL (ref 78.0–100.0)
Platelets: 250 10*3/uL (ref 150–400)
RBC: 4.52 MIL/uL (ref 3.87–5.11)
RDW: 17.4 % — ABNORMAL HIGH (ref 11.5–15.5)
WBC: 4.5 10*3/uL (ref 4.0–10.5)

## 2017-02-20 MED ORDER — KETOROLAC TROMETHAMINE 10 MG PO TABS
10.0000 mg | ORAL_TABLET | Freq: Once | ORAL | Status: DC
  Filled 2017-02-20: qty 1

## 2017-02-20 MED ORDER — DICYCLOMINE HCL 10 MG PO CAPS: 10.0000 mg | ORAL_CAPSULE | Freq: Once | ORAL | Status: DC

## 2017-02-20 NOTE — ED Provider Notes (Signed)
[facility] DEPT Provider Note   CSN: [id number] Arrival date & time: 02/20/17  1028     History   Chief Complaint Chief Complaint  Patient presents with  . Fatigue  . Weakness    HPI Linda Thomas is a 36 y.o. female.  36 year old female with PMH including hypertension and Bell's palsy who presents with fatigue. The patient states that she has had ongoing generalized fatigue since she was diagnosed with Bell's palsy in December of last year. She just feels weak all the time. She denies any associated chest pain, shortness of breath, urinary symptoms, heavy periods, vomiting, diarrhea, fevers, or unintentional weight loss. She does note that she snores very badly. She has never been evaluated before as she does not have a PCP.   The history is provided by the patient.  Weakness     Past Medical History:  Diagnosis Date  . Bell's palsy   . Hypertension during pregnancy    Patient Active Problem List   Diagnosis Date Noted  . Bell's palsy 06/21/2016  . History of hypertension 06/21/2016    Past Surgical History:  Procedure Laterality Date  . CESAREAN SECTION    . TUBAL LIGATION      OB History    No data available       Home Medications    Prior to Admission medications   Medication Sig Start Date End Date Taking? Authorizing Provider  hydrochlorothiazide (HYDRODIURIL) 25 MG tablet Take 1 tablet (25 mg total) by mouth daily. 02/16/17   Khatri, Hina, PA-C  ibuprofen (ADVIL,MOTRIN) 200 MG tablet Take 200 mg by mouth every 6 (six) hours as needed for moderate pain.    [provider]  methylPREDNISolone (MEDROL DOSEPAK) 4 MG TBPK tablet Taper over 6 days. 02/16/17   [name], PA-C    Family History No family history on file.  Social History Social History  Substance Use Topics  . Smoking status: Former Research scientist (life sciences)  . Smokeless tobacco: Never Used  . Alcohol use Yes     Comment: social     Allergies   Other and  Penicillins   Review of Systems Review of Systems  Neurological: Positive for weakness.   All other systems reviewed and are negative except that which was mentioned in HPI   Physical Exam Updated Vital Signs BP (!) 134/91 (BP Location: Left Arm)   Pulse 83   Temp 97.9 F (36.6 C) (Oral)   Resp 16   SpO2 95%   Physical Exam  Constitutional: She is oriented to person, place, and time. She appears well-developed and well-nourished. No distress.  HENT:  Head: Normocephalic and atraumatic.  Mouth/Throat: Oropharynx is clear and moist.  Moist mucous membranes  Eyes: Pupils are equal, round, and reactive to light. Conjunctivae are normal.  Neck: Neck supple.  Cardiovascular: Normal rate, regular rhythm and normal heart sounds.   No murmur heard. Pulmonary/Chest: Effort normal and breath sounds normal.  Abdominal: Soft. Bowel sounds are normal. She exhibits no distension. There is no tenderness.  Musculoskeletal: She exhibits no edema.  Neurological: She is alert and oriented to person, place, and time.  Fluent speech  Skin: Skin is warm and dry. No rash noted.  Psychiatric: She has a normal mood and affect. Judgment normal.  Nursing note and vitals reviewed.    ED Treatments / Results  Labs (all labs ordered are listed, but only abnormal results are displayed) Labs Reviewed  CBC - Abnormal; Notable for the following:  Result Value   Hemoglobin 11.4 (*)    MCH 25.2 (*)    RDW 17.4 (*)    All other components within normal limits  URINALYSIS, ROUTINE W REFLEX MICROSCOPIC - Abnormal; Notable for the following:    Hgb urine dipstick MODERATE (*)    Squamous Epithelial / LPF 0-5 (*)    All other components within normal limits  BASIC METABOLIC PANEL  POC URINE PREG, ED    EKG  EKG Interpretation None       Radiology No results found.  Procedures Procedures (including critical care time)  Medications Ordered in ED Medications - No data to  display   Initial Impression / Assessment and Plan / ED Course  I have reviewed the triage vital signs and the nursing notes.  Pertinent labs that were available during my care of the patient were reviewed by me and considered in my medical decision making (see chart for details).     PT w/ months of generalized fatigue. No infectious sx or constitutional sx to suggest malignancy. Labs reassuring, mild anemia w/ Hgb 11.4. Instructed on iron supplementation. I explained that she would benefit from a PCP evaluation including thyroid studies, B12 and folate, and further workup. It is certainly possible that she has undiagnosed sleep apnea and I have strongly recommended that she had testing for this. Return precautions reviewed and patient discharged in satisfactory condition.  Final Clinical Impressions(s) / ED Diagnoses   Final diagnoses:  Fatigue, unspecified type    New Prescriptions Discharge Medication List as of 02/20/2017  2:50 PM       Little, Wenda Overland, MD 02/20/17 1544

## 2017-02-20 NOTE — ED Triage Notes (Addendum)
To ED for eval of continued fatigue since Bell's Palsy dx in Dec. States she feels extremely weak all the time. No cp or sob. Currently on period. Checked her cbg yesterday - fasting was 70

## 2017-08-08 ENCOUNTER — Emergency Department (HOSPITAL_COMMUNITY): Payer: BLUE CROSS/BLUE SHIELD

## 2017-08-08 ENCOUNTER — Encounter (HOSPITAL_COMMUNITY): Payer: Self-pay | Admitting: Emergency Medicine

## 2017-08-08 ENCOUNTER — Emergency Department (HOSPITAL_COMMUNITY)
Admission: EM | Admit: 2017-08-08 | Discharge: 2017-08-08 | Disposition: A | Discharge status: Home / Self Care | Payer: BLUE CROSS/BLUE SHIELD | Attending: Physician Assistant | Admitting: Physician Assistant

## 2017-08-08 DIAGNOSIS — I10 Essential (primary) hypertension: Secondary | ICD-10-CM | POA: Diagnosis not present

## 2017-08-08 DIAGNOSIS — R51 Headache: Secondary | ICD-10-CM | POA: Diagnosis not present

## 2017-08-08 DIAGNOSIS — H9390 Unspecified disorder of ear, unspecified ear: Secondary | ICD-10-CM | POA: Diagnosis not present

## 2017-08-08 DIAGNOSIS — J069 Acute upper respiratory infection, unspecified: Secondary | ICD-10-CM | POA: Diagnosis not present

## 2017-08-08 DIAGNOSIS — Z79899 Other long term (current) drug therapy: Secondary | ICD-10-CM | POA: Insufficient documentation

## 2017-08-08 DIAGNOSIS — Z87891 Personal history of nicotine dependence: Secondary | ICD-10-CM | POA: Insufficient documentation

## 2017-08-08 DIAGNOSIS — R519 Headache, unspecified: Secondary | ICD-10-CM

## 2017-08-08 LAB — COMPREHENSIVE METABOLIC PANEL
ALBUMIN: 3.8 g/dL (ref 3.5–5.0)
ALT: 17 U/L (ref 14–54)
ANION GAP: 11 (ref 5–15)
AST: 21 U/L (ref 15–41)
Alkaline Phosphatase: 75 U/L (ref 38–126)
BILIRUBIN TOTAL: 0.3 mg/dL (ref 0.3–1.2)
BUN: 8 mg/dL (ref 6–20)
CHLORIDE: 102 mmol/L (ref 101–111)
CO2: 23 mmol/L (ref 22–32)
Calcium: 8.7 mg/dL — ABNORMAL LOW (ref 8.9–10.3)
Creatinine, Ser: 0.8 mg/dL (ref 0.44–1.00)
GFR calc Af Amer: >60 mL/min (ref >60)
GFR calc non Af Amer: >60 mL/min (ref >60)
GLUCOSE: 102 mg/dL — AB (ref 65–99)
Potassium: 3.6 mmol/L (ref 3.5–5.1)
Sodium: 136 mmol/L (ref 135–145)
TOTAL PROTEIN: 7.6 g/dL (ref 6.5–8.1)

## 2017-08-08 LAB — CBC WITH DIFFERENTIAL/PLATELET
BASOS PCT: 0 %
Basophils Absolute: 0 10*3/uL (ref 0.0–0.1)
EOS ABS: 0.1 10*3/uL (ref 0.0–0.7)
Eosinophils Relative: 2 %
HCT: 29.4 % — ABNORMAL LOW (ref 36.0–46.0)
HEMOGLOBIN: 8.6 g/dL — AB (ref 12.0–15.0)
Lymphocytes Relative: 29 %
Lymphs Abs: 1.5 10*3/uL (ref 0.7–4.0)
MCH: 22.9 pg — ABNORMAL LOW (ref 26.0–34.0)
MCHC: 29.3 g/dL — AB (ref 30.0–36.0)
MCV: 78.2 fL (ref 78.0–100.0)
Monocytes Absolute: 0.5 10*3/uL (ref 0.1–1.0)
Monocytes Relative: 9 %
NEUTROS ABS: 3.2 10*3/uL (ref 1.7–7.7)
NEUTROS PCT: 60 %
Platelets: 325 10*3/uL (ref 150–400)
RBC: 3.76 MIL/uL — AB (ref 3.87–5.11)
RDW: 17.6 % — ABNORMAL HIGH (ref 11.5–15.5)
WBC: 5.3 10*3/uL (ref 4.0–10.5)

## 2017-08-08 LAB — INFLUENZA PANEL BY PCR (TYPE A & B)
Influenza A By PCR: NEGATIVE
Influenza B By PCR: NEGATIVE

## 2017-08-08 LAB — I-STAT BETA HCG BLOOD, ED (MC, WL, AP ONLY)

## 2017-08-08 LAB — RAPID STREP SCREEN (MED CTR MEBANE ONLY): Streptococcus, Group A Screen (Direct): NEGATIVE

## 2017-08-08 LAB — I-STAT CG4 LACTIC ACID, ED: LACTIC ACID, VENOUS: 1.14 mmol/L (ref 0.5–1.9)

## 2017-08-08 MED ORDER — FERROUS SULFATE 325 (65 FE) MG PO TABS: 325.0000 mg | ORAL_TABLET | Freq: Every day | ORAL | 0 refills | Status: DC

## 2017-08-08 MED ORDER — METOCLOPRAMIDE HCL 10 MG PO TABS
5.0000 mg | ORAL_TABLET | Freq: Once | ORAL | Status: AC
  Administered 2017-08-08: 5 mg via ORAL
  Filled 2017-08-08: qty 1

## 2017-08-08 MED ORDER — AZITHROMYCIN 250 MG PO TABS: 1000.0000 mg | ORAL_TABLET | Freq: Once | ORAL | Status: DC

## 2017-08-08 MED ORDER — SODIUM CHLORIDE 0.9 % IV BOLUS (SEPSIS)
1000.0000 mL | Freq: Once | INTRAVENOUS | Status: AC
  Administered 2017-08-08: 1000 mL via INTRAVENOUS

## 2017-08-08 MED ORDER — ACETAMINOPHEN 325 MG PO TABS
650.0000 mg | ORAL_TABLET | Freq: Once | ORAL | Status: AC | PRN
  Administered 2017-08-08: 650 mg via ORAL
  Filled 2017-08-08: qty 2

## 2017-08-08 MED ORDER — KETOROLAC TROMETHAMINE 30 MG/ML IJ SOLN: 30.0000 mg | Freq: Once | INTRAMUSCULAR | Status: DC

## 2017-08-08 MED ORDER — IBUPROFEN 400 MG PO TABS
600.0000 mg | ORAL_TABLET | Freq: Once | ORAL | Status: AC
  Administered 2017-08-08: 600 mg via ORAL
  Filled 2017-08-08: qty 1

## 2017-08-08 MED ORDER — KETOROLAC TROMETHAMINE 30 MG/ML IJ SOLN
30.0000 mg | Freq: Once | INTRAMUSCULAR | Status: AC
  Administered 2017-08-08: 30 mg via INTRAVENOUS
  Filled 2017-08-08: qty 1

## 2017-08-08 MED ORDER — KETOROLAC TROMETHAMINE 60 MG/2ML IM SOLN: 60.0000 mg | Freq: Once | INTRAMUSCULAR | Status: DC

## 2017-08-08 NOTE — ED Notes (Signed)
Pt. To CT via stretcher. 

## 2017-08-08 NOTE — ED Provider Notes (Signed)
Patient placed in Quick Look pathway, seen and evaluated   Chief Complaint: of a headache  HPI:   Pt states she has had a headache 3 days, low grade fever, ear congestion, nasal congestion, sneezing. Reports photoactivity. Reports pain to right side of the neck. Has taken multiple over the counter medication with no relief.   ROS: positive for nasal congestion, headache, fever, photophobia, negative for neck stiffness, nausea, vomimting, diarrhea, numbness weakness in extremties  Physical Exam:   Gen: No distress  Neuro: Awake and Alert  Skin: Warm    Focused Exam: normal oropharynx, normal nose, ttp over sinuses bialteraly. ttp over right anterior cervical lymph chain. No nuchal regidity. Moving all extremities. 5/5 and equal upper and lower extremity strength bilaterally.   Pt with fever, 100.4 here, tachycardia, headache. No nuchal rigidity. Doubt meningitis. Will give toradol for headache, fluids. Basic labs ordered.    Initiation of care has begun. The patient has been counseled on the process, plan, and necessity for staying for the completion/evaluation, and the remainder of the medical screening examination    Jeannett Senior, PA-C 08/08/17 1779    Tegeler, Gwenyth Allegra, MD 08/09/17 [phone]

## 2017-08-08 NOTE — Discharge Instructions (Signed)
Please read the attached information.  Please continue using Tylenol or ibuprofen as needed for discomfort.  Please return to the emergency room immediately if you develop any new or worsening signs or symptoms.  If your symptoms continue to persist beyond 2 days please return for repeat evaluation or follow-up with your primary care provider.

## 2017-08-08 NOTE — ED Triage Notes (Signed)
Pt reports headache, sinus problems, and swollen right sided lymph nodes that started Friday. Temp 100.4 in triage.

## 2017-08-08 NOTE — ED Provider Notes (Addendum)
[facility] EMERGENCY DEPARTMENT Provider Note   CSN: [id number] Arrival date & time: 08/08/17  1530   History   Chief Complaint Chief Complaint  Patient presents with  . Headache    HPI Linda Thomas is a 37 y.o. female.  HPI   37 year old female presents today with with complaints of headache.  Patient notes that several days ago she was experiencing nasal congestion, abnormal sensation in her throat and headache.  She notes the pain feels like pressure in the front with radiation to the back.  She notes that her upper respiratory symptoms have improved, but continues to have a headache, low-grade fever.  Denies any significant sore throat but notes some tender lymphadenopathy, she reports bilateral ear congestion as if she has "fluid in her ears".  She denies any neck stiffness or pain with range of motion, she denies any cough, abdominal pain nausea vomiting, dysuria, or any other infectious etiology.  Patient notes that she has a child at home that is sick with similar symptoms.  She reports she did not receive the flu vaccine.  Patient notes that she started on her menstrual cycle several days ago with a very heavy onset which is typical for her.  She notes a history of baseline anemia.  She denies any other bleeding or dark or tarry stools.      Past Medical History:  Diagnosis Date  . Bell's palsy   . Hypertension during pregnancy    Patient Active Problem List   Diagnosis Date Noted  . Bell's palsy 06/21/2016  . History of hypertension 06/21/2016    Past Surgical History:  Procedure Laterality Date  . CESAREAN SECTION    . TUBAL LIGATION      OB History    No data available       Home Medications    Prior to Admission medications   Medication Sig Start Date End Date Taking? Authorizing Provider  fexofenadine (ALLEGRA) 180 MG tablet Take 180 mg by mouth daily as needed for allergies or rhinitis.   Yes [provider]    hydrochlorothiazide (HYDRODIURIL) 25 MG tablet Take 1 tablet (25 mg total) by mouth daily. 02/16/17  Yes Khatri, Hina, PA-C  ibuprofen (ADVIL,MOTRIN) 200 MG tablet Take 800 mg by mouth every 6 (six) hours as needed for moderate pain.    Yes [provider]  Multiple Vitamin (MULTIVITAMIN WITH MINERALS) TABS tablet Take 1 tablet by mouth daily.   Yes [provider]  methylPREDNISolone (MEDROL DOSEPAK) 4 MG TBPK tablet Taper over 6 days. Patient not taking: Reported on 08/08/2017 02/16/17   [name], PA-C    Family History No family history on file.  Social History Social History   Tobacco Use  . Smoking status: Former Research scientist (life sciences)  . Smokeless tobacco: Never Used  Substance Use Topics  . Alcohol use: Yes    Comment: social  . Drug use: No     Allergies   Other and Penicillins   Review of Systems Review of Systems  All other systems reviewed and are negative.    Physical Exam Updated Vital Signs BP 138/90   Pulse (!) 104   Temp (!) 100.4 F (38 C) (Oral)   Resp 16   Ht 5\' 3"  (1.6 m)   Wt 103.4 kg (228 lb)   SpO2 99%   BMI 40.39 kg/m   Physical Exam  Constitutional: She is oriented to person, place, and time. She appears well-developed and well-nourished.  HENT:  Head: Normocephalic and atraumatic.  Minor posterior oropharyngeal erythema, no edema, no significant tonsillar enlargement or pooling of secretions uvula midline rises with phonation  Minor right anterior cervical lymphadenopathy  Bilateral TMs normal without erythema but  Eyes: Conjunctivae are normal. Pupils are equal, round, and reactive to light. Right eye exhibits no discharge. Left eye exhibits no discharge. No scleral icterus.  Neck: Normal range of motion. No JVD present. No tracheal deviation present.  Neck is supple with full range of motion no pain with range of motion no meningeal findings  Cardiovascular: Regular rhythm.  No murmur heard. Pulmonary/Chest: Effort normal. No  stridor. No respiratory distress. She has no wheezes. She has no rales. She exhibits no tenderness.  Neurological: She is alert and oriented to person, place, and time. She has normal strength. Coordination normal. GCS eye subscore is 4. GCS verbal subscore is 5. GCS motor subscore is 6.  Psychiatric: She has a normal mood and affect. Her behavior is normal. Judgment and thought content normal.  Nursing note and vitals reviewed.    ED Treatments / Results  Labs (all labs ordered are listed, but only abnormal results are displayed) Labs Reviewed  CBC WITH DIFFERENTIAL/PLATELET - Abnormal; Notable for the following components:      Result Value   RBC 3.76 (*)    Hemoglobin 8.6 (*)    HCT 29.4 (*)    MCH 22.9 (*)    MCHC 29.3 (*)    RDW 17.6 (*)    All other components within normal limits  COMPREHENSIVE METABOLIC PANEL - Abnormal; Notable for the following components:   Glucose, Bld 102 (*)    Calcium 8.7 (*)    All other components within normal limits  RAPID STREP SCREEN (NOT AT [facility])  CULTURE, GROUP A STREP ([place])  INFLUENZA PANEL BY PCR (TYPE A & B)  I-STAT CG4 LACTIC ACID, ED  I-STAT BETA HCG BLOOD, ED ([facility], [facility], [facility] ONLY)  I-STAT CG4 LACTIC ACID, ED    EKG  EKG Interpretation None       Radiology Ct Head Wo Contrast  Result Date: 08/08/2017 CLINICAL DATA:  Headache EXAM: CT HEAD WITHOUT CONTRAST TECHNIQUE: Contiguous axial images were obtained from the base of the skull through the vertex without intravenous contrast. COMPARISON:  None. FINDINGS: Brain: No evidence of acute infarction, hemorrhage, hydrocephalus, extra-axial collection or mass lesion/mass effect. Vascular: No hyperdense vessel or unexpected calcification. Skull: Normal. Negative for fracture or focal lesion. Sinuses/Orbits: No acute finding. Other: None IMPRESSION: Negative non contrasted CT appearance of the brain. Electronically Signed   By: Donavan Foil M.D.   On: 08/08/2017 19:37     Procedures Procedures (including critical care time)  Medications Ordered in ED Medications  acetaminophen (TYLENOL) tablet 650 mg (650 mg Oral Given 08/08/17 1618)  sodium chloride 0.9 % bolus 1,000 mL (1,000 mLs Intravenous New Bag/Given 08/08/17 1820)  ketorolac (TORADOL) 30 MG/ML injection 30 mg (30 mg Intravenous Given 08/08/17 1820)  metoCLOPramide (REGLAN) tablet 5 mg (5 mg Oral Given 08/08/17 1819)     Initial Impression / Assessment and Plan / ED Course  I have reviewed the triage vital signs and the nursing notes.  Pertinent labs & imaging results that were available during my care of the patient were reviewed by me and considered in my medical decision making (see chart for details).      Final Clinical Impressions(s) / ED Diagnoses   Final diagnoses:  Viral upper respiratory tract infection  Acute nonintractable  headache, unspecified headache type    Labs: I-STAT lactic acid, i-STAT beta-hCG, CBC, CMP, influenza, rapid strep  Imaging: CT head without  Consults:  Therapeutics: Tylenol, Toradol, Reglan  Discharge Meds: Iron  Assessment/Plan: 37 year old female presents today with likely viral upper respiratory infection.  She has low-grade fever at 100.4 slightly tachycardic.  She has no elevation in white blood cell count.  Patient does have a fever, she has no significant neck stiffness or signs of meningitis.  Patient does have sensation of fullness in her ears with no acute otitis media.  Question influenza in this patient given her headache, due to nature of headache CT head ordered.   Pt's CT without acute abnormality - heading improving.  Patient without dizziness, neck remains supple.  I have low suspicion for meningitis in this patient however suspicion for viral upper respiratory infection.  Influenza sent, but patient is not within the window for treatment and I do not think would need treatment at this time.  Had lengthy discussion with patient and family  that this is likely a viral upper respiratory infection although meningitis is on the differential.  I talked to them about evaluating for meningitis they agree that no lumbar puncture would be necessary at this time.  I discussed going home and using Tylenol ibuprofen as needed for discomfort following up in 2 days if symptoms persist follow-up immediately if they worsen.  Patient is also anemic here this is likely secondary to ongoing vaginal bleeding from her menstrual cycle.  She is normally anemic, but appears to be asymptomatic from this given no dizziness or significant vital sign abnormalities.  I do not think this is the cause of her headache here today.  Patient will have labs rechecked in the next 2-3 days, sooner if symptoms worsen. Recheck of patients temperature shows 99.7.   Patient and her husband at bedside verbalized return precautions, she had no further questions or concerns at time of discharge.    ED Discharge Orders    None         Francee Gentile 08/08/17 2148    Macarthur Critchley, MD 08/10/17 [phone]

## 2017-08-11 LAB — CULTURE, GROUP A STREP (THRC)

## 2018-02-20 ENCOUNTER — Other Ambulatory Visit: Payer: Self-pay

## 2018-02-20 ENCOUNTER — Emergency Department (HOSPITAL_COMMUNITY): Payer: BLUE CROSS/BLUE SHIELD

## 2018-02-20 ENCOUNTER — Encounter (HOSPITAL_COMMUNITY): Payer: Self-pay | Admitting: Emergency Medicine

## 2018-02-20 ENCOUNTER — Emergency Department (HOSPITAL_COMMUNITY)
Admission: EM | Admit: 2018-02-20 | Discharge: 2018-02-21 | Disposition: A | Discharge status: Home / Self Care | Payer: BLUE CROSS/BLUE SHIELD | Attending: Emergency Medicine | Admitting: Emergency Medicine

## 2018-02-20 DIAGNOSIS — I10 Essential (primary) hypertension: Secondary | ICD-10-CM | POA: Diagnosis not present

## 2018-02-20 DIAGNOSIS — Z79899 Other long term (current) drug therapy: Secondary | ICD-10-CM | POA: Diagnosis not present

## 2018-02-20 DIAGNOSIS — Z87891 Personal history of nicotine dependence: Secondary | ICD-10-CM | POA: Insufficient documentation

## 2018-02-20 DIAGNOSIS — R079 Chest pain, unspecified: Secondary | ICD-10-CM | POA: Diagnosis not present

## 2018-02-20 DIAGNOSIS — R072 Precordial pain: Secondary | ICD-10-CM | POA: Insufficient documentation

## 2018-02-20 DIAGNOSIS — R0789 Other chest pain: Secondary | ICD-10-CM | POA: Diagnosis not present

## 2018-02-20 LAB — CBC
HEMATOCRIT: 31.5 % — AB (ref 36.0–46.0)
Hemoglobin: 8.9 g/dL — ABNORMAL LOW (ref 12.0–15.0)
MCH: 21.4 pg — AB (ref 26.0–34.0)
MCHC: 28.3 g/dL — ABNORMAL LOW (ref 30.0–36.0)
MCV: 75.9 fL — ABNORMAL LOW (ref 78.0–100.0)
Platelets: 234 10*3/uL (ref 150–400)
RBC: 4.15 MIL/uL (ref 3.87–5.11)
RDW: 19.2 % — ABNORMAL HIGH (ref 11.5–15.5)
WBC: 5.7 10*3/uL (ref 4.0–10.5)

## 2018-02-20 LAB — I-STAT BETA HCG BLOOD, ED (MC, WL, AP ONLY): I-stat hCG, quantitative: <5 m[IU]/mL (ref <5)

## 2018-02-20 LAB — BASIC METABOLIC PANEL
ANION GAP: 11 (ref 5–15)
BUN: 15 mg/dL (ref 6–20)
CALCIUM: 9 mg/dL (ref 8.9–10.3)
CHLORIDE: 107 mmol/L (ref 98–111)
CO2: 21 mmol/L — AB (ref 22–32)
Creatinine, Ser: 0.75 mg/dL (ref 0.44–1.00)
GFR calc non Af Amer: >60 mL/min (ref >60)
GLUCOSE: 95 mg/dL (ref 70–99)
Potassium: 3.9 mmol/L (ref 3.5–5.1)
Sodium: 139 mmol/L (ref 135–145)

## 2018-02-20 LAB — I-STAT TROPONIN, ED: TROPONIN I, POC: 0 ng/mL (ref 0.00–0.08)

## 2018-02-20 NOTE — ED Triage Notes (Signed)
Pt presents to ED for assessment of left sided chest pain, radiation to her neck and left arm, with some numbness and tingling.  Pt hypertensive in triage, hx of HTN, has not taken BP medications in "several months".  Patient states headache yesterday, denies today.  Denies n/v, c/o SOB with pain, and light-headedness.

## 2018-02-21 LAB — I-STAT TROPONIN, ED: Troponin i, poc: 0 ng/mL (ref 0.00–0.08)

## 2018-02-21 MED ORDER — HYDROCHLOROTHIAZIDE 25 MG PO TABS: 25.0000 mg | ORAL_TABLET | Freq: Every day | ORAL | 0 refills | Status: DC

## 2018-02-21 NOTE — ED Provider Notes (Signed)
[facility] EMERGENCY DEPARTMENT Provider Note  CSN: [id number] Arrival date & time: 02/20/18 1815  Chief Complaint(s) Chest Pain  HPI Linda Thomas is a 37 y.o. female   The history is provided by the patient.  Chest Pain   This is a new problem. Episode onset: 14 hours. Episode frequency: intermittent; every 10-20 mins, lasting <1 min each time. The problem has been resolved (last felt pain 3 hrs ago). The pain is associated with rest. The pain is present in the substernal region. The quality of the pain is described as sharp and stabbing. The pain does not radiate. Associated symptoms include numbness (left arm numbness lasted 1 hour; now resolved) and shortness of breath (only when pain was intense). Pertinent negatives include no back pain, no cough, no exertional chest pressure, no fever, no headaches, no irregular heartbeat, no leg pain, no lower extremity edema, no malaise/fatigue, no nausea and no vomiting. She has tried nothing for the symptoms. Risk factors include obesity.  Pertinent negatives for past medical history include no CAD, no CHF, no diabetes, no DVT, no hyperlipidemia, no hypertension, no MI, no PE, no strokes and no TIA.  Pertinent negatives for family medical history include: no early MI.  Procedure history is negative for cardiac catheterization and exercise treadmill test.   No recent travel, no OCP.   Past Medical History Past Medical History:  Diagnosis Date  . Bell's palsy   . Hypertension during pregnancy   Patient Active Problem List   Diagnosis Date Noted  . Bell's palsy 06/21/2016  . History of hypertension 06/21/2016   Home Medication(s) Prior to Admission medications   Medication Sig Start Date End Date Taking? Authorizing Provider  ferrous sulfate 325 (65 FE) MG tablet Take 1 tablet (325 mg total) by mouth daily. 08/08/17   Hedges, Dellis Filbert, PA-C  fexofenadine (ALLEGRA) 180 MG tablet Take 180 mg by mouth daily as needed  for allergies or rhinitis.    [provider]  hydrochlorothiazide (HYDRODIURIL) 25 MG tablet Take 1 tablet (25 mg total) by mouth daily. 02/16/17   Khatri, Hina, PA-C  ibuprofen (ADVIL,MOTRIN) 200 MG tablet Take 800 mg by mouth every 6 (six) hours as needed for moderate pain.     [provider]  methylPREDNISolone (MEDROL DOSEPAK) 4 MG TBPK tablet Taper over 6 days. Patient not taking: Reported on 08/08/2017 02/16/17   [name], PA-C  Multiple Vitamin (MULTIVITAMIN WITH MINERALS) TABS tablet Take 1 tablet by mouth daily.    [provider]                                                                                                                                    Past Surgical History Past Surgical History:  Procedure Laterality Date  . CESAREAN SECTION    . TUBAL LIGATION     Family History History reviewed. No pertinent family history.  Social History Social  History   Tobacco Use  . Smoking status: Former Research scientist (life sciences)  . Smokeless tobacco: Never Used  Substance Use Topics  . Alcohol use: Yes    Comment: social  . Drug use: No   Allergies Other and Penicillins  Review of Systems Review of Systems  Constitutional: Negative for fever and malaise/fatigue.  Respiratory: Positive for shortness of breath (only when pain was intense). Negative for cough.   Cardiovascular: Positive for chest pain.  Gastrointestinal: Negative for nausea and vomiting.  Musculoskeletal: Negative for back pain.  Neurological: Positive for numbness (left arm numbness lasted 1 hour; now resolved). Negative for headaches.   All other systems are reviewed and are negative for acute change except as noted in the HPI  Physical Exam Vital Signs  I have reviewed the triage vital signs BP (!) 149/91   Pulse 91   Temp 98.4 F (36.9 C) (Oral)   Resp 17   LMP 02/06/2018   SpO2 100%   Physical Exam  Constitutional: She is oriented to person, place, and time. She appears  well-developed and well-nourished. No distress.  HENT:  Head: Normocephalic and atraumatic.  Nose: Nose normal.  Eyes: Pupils are equal, round, and reactive to light. Conjunctivae and EOM are normal. Right eye exhibits no discharge. Left eye exhibits no discharge. No scleral icterus.  Neck: Normal range of motion. Neck supple.  Cardiovascular: Normal rate and regular rhythm. Exam reveals no gallop and no friction rub.  No murmur heard. Pulmonary/Chest: Effort normal and breath sounds normal. No stridor. No respiratory distress. She has no rales.  Abdominal: Soft. She exhibits no distension. There is no tenderness.  Musculoskeletal: She exhibits no edema or tenderness.  Neurological: She is alert and oriented to person, place, and time. She has normal strength. No sensory deficit.  Skin: Skin is warm and dry. No rash noted. She is not diaphoretic. No erythema.  Psychiatric: She has a normal mood and affect.  Vitals reviewed.   ED Results and Treatments Labs (all labs ordered are listed, but only abnormal results are displayed) Labs Reviewed  BASIC METABOLIC PANEL - Abnormal; Notable for the following components:      Result Value   CO2 21 (*)    All other components within normal limits  CBC - Abnormal; Notable for the following components:   Hemoglobin 8.9 (*)    HCT 31.5 (*)    MCV 75.9 (*)    MCH 21.4 (*)    MCHC 28.3 (*)    RDW 19.2 (*)    All other components within normal limits  I-STAT TROPONIN, ED  I-STAT BETA HCG BLOOD, ED ([facility], [facility], [facility] ONLY)  I-STAT TROPONIN, ED                                                                                                                         EKG  EKG Interpretation  Date/Time:  Tuesday February 20 2018 18:24:56 EDT Ventricular Rate:  105 PR Interval:  160  QRS Duration: 82 QT Interval:  382 QTC Calculation: 504 R Axis:   39 Text Interpretation:  Sinus tachycardia Low voltage QRS Borderline ECG No significant change since  last tracing Confirmed by Addison Lank [phone]) on 02/20/2018 11:45:48 PM      Radiology Dg Chest 2 View  Result Date: 02/20/2018 CLINICAL DATA:  Central chest pain and dyspnea x1 day. Smoking history. EXAM: CHEST - 2 VIEW COMPARISON:  08/29/2016 FINDINGS: The heart size and mediastinal contours are within normal limits. Both lungs are clear. The visualized skeletal structures are unremarkable. IMPRESSION: No active cardiopulmonary disease. Electronically Signed   By: Ashley Royalty M.D.   On: 02/20/2018 19:45   Pertinent labs & imaging results that were available during my care of the patient were reviewed by me and considered in my medical decision making (see chart for details).  Medications Ordered in ED Medications - No data to display                                                                                                                                  Procedures Procedures  (including critical care time)  Medical Decision Making / ED Course I have reviewed the nursing notes for this encounter and the patient's prior records (if available in EHR or on provided paperwork).    Highly atypical chest pain most consistent with MSK versus GI.  Low suspicion for cardiac etiology/ACS.  EKG without acute ischemic changes or evidence of pericarditis.  Initial troponin negative.  Heart score less than 4.  Delta troponin negative.  Low pretest probability for pulmonary embolism and PERC negative.  Doubt PE.  Chest x-ray without evidence suggestive of pneumonia, pneumothorax, pneumomediastinum.  No abnormal contour of the mediastinum to suggest dissection. No evidence of acute injuries.  Presentation not classic for aortic dissection or esophageal perforation.  Final Clinical Impression(s) / ED Diagnoses Final diagnoses:  Precordial pain   Disposition: Discharge  Condition: Good  I have discussed the results, Dx and Tx plan with the patient who expressed understanding and  agree(s) with the plan. Discharge instructions discussed at great length. The patient was given strict return precautions who verbalized understanding of the instructions. No further questions at time of discharge.    ED Discharge Orders    None       Follow Up: Primary care provider  Schedule an appointment as soon as possible for a visit  As needed      This chart was dictated using voice recognition software.  Despite best efforts to proofread,  errors can occur which can change the documentation meaning.   Fatima Blank, MD 02/21/18 [id number]

## 2018-05-23 ENCOUNTER — Encounter (HOSPITAL_COMMUNITY): Payer: Self-pay | Admitting: Emergency Medicine

## 2018-05-23 ENCOUNTER — Emergency Department (HOSPITAL_COMMUNITY)
Admission: EM | Admit: 2018-05-23 | Discharge: 2018-05-23 | Disposition: A | Discharge status: Home / Self Care | Payer: BLUE CROSS/BLUE SHIELD | Attending: Emergency Medicine | Admitting: Emergency Medicine

## 2018-05-23 ENCOUNTER — Other Ambulatory Visit: Payer: Self-pay

## 2018-05-23 DIAGNOSIS — Z87891 Personal history of nicotine dependence: Secondary | ICD-10-CM | POA: Diagnosis not present

## 2018-05-23 DIAGNOSIS — Z79899 Other long term (current) drug therapy: Secondary | ICD-10-CM | POA: Insufficient documentation

## 2018-05-23 DIAGNOSIS — I10 Essential (primary) hypertension: Secondary | ICD-10-CM | POA: Insufficient documentation

## 2018-05-23 DIAGNOSIS — J039 Acute tonsillitis, unspecified: Secondary | ICD-10-CM

## 2018-05-23 DIAGNOSIS — J029 Acute pharyngitis, unspecified: Secondary | ICD-10-CM | POA: Diagnosis not present

## 2018-05-23 MED ORDER — AZITHROMYCIN 250 MG PO TABS: ORAL_TABLET | ORAL | 0 refills | Status: DC

## 2018-05-23 NOTE — Discharge Instructions (Addendum)
Use Tylenol for fever or pain.  See your doctor as needed for problems.

## 2018-05-23 NOTE — ED Notes (Signed)
ED Provider at bedside. 

## 2018-05-23 NOTE — ED Provider Notes (Signed)
[facility] EMERGENCY DEPARTMENT Provider Note   CSN: [id number] Arrival date & time: 05/23/18  1531     History   Chief Complaint Chief Complaint  Patient presents with  . Sore Throat    HPI Linda Thomas is a 37 y.o. female.  HPI   She complains of left-sided sore throat present for several days, after being exposed to her mother who had a similar illness.  She has had low-grade fever but no chills or rigors.  She denies cough, shortness of breath, weakness or dizziness.  There are no other known modifying factors.  Past Medical History:  Diagnosis Date  . Bell's palsy   . Hypertension during pregnancy    Patient Active Problem List   Diagnosis Date Noted  . Bell's palsy 06/21/2016  . History of hypertension 06/21/2016    Past Surgical History:  Procedure Laterality Date  . CESAREAN SECTION    . TUBAL LIGATION       OB History   No obstetric history on file.      Home Medications    Prior to Admission medications   Medication Sig Start Date End Date Taking? Authorizing Provider  azithromycin (ZITHROMAX Z-PAK) 250 MG tablet 2 po day one, then 1 daily x 4 days 05/23/18   Daleen Bo, MD  ferrous sulfate 325 (65 FE) MG tablet Take 1 tablet (325 mg total) by mouth daily. 08/08/17   Hedges, Dellis Filbert, PA-C  fexofenadine (ALLEGRA) 180 MG tablet Take 180 mg by mouth daily as needed for allergies or rhinitis.    [provider]  hydrochlorothiazide (HYDRODIURIL) 25 MG tablet Take 1 tablet (25 mg total) by mouth daily. 02/21/18   Cardama, Grayce Sessions, MD  ibuprofen (ADVIL,MOTRIN) 200 MG tablet Take 800 mg by mouth every 6 (six) hours as needed for moderate pain.     [provider]  methylPREDNISolone (MEDROL DOSEPAK) 4 MG TBPK tablet Taper over 6 days. Patient not taking: Reported on 08/08/2017 02/16/17   [name], PA-C  Multiple Vitamin (MULTIVITAMIN WITH MINERALS) TABS tablet Take 1 tablet by mouth daily.    [provider]    Family History No family history on file.  Social History Social History   Tobacco Use  . Smoking status: Former Research scientist (life sciences)  . Smokeless tobacco: Never Used  Substance Use Topics  . Alcohol use: Yes    Comment: social  . Drug use: No     Allergies   Other and Penicillins   Review of Systems Review of Systems  All other systems reviewed and are negative.    Physical Exam Updated Vital Signs BP 119/84   Pulse 95   Temp 98.2 F (36.8 C) (Oral)   Resp 16   Ht 5\' 3"  (1.6 m)   Wt 97.5 kg   SpO2 100%   BMI 38.09 kg/m   Physical Exam Vitals signs and nursing note reviewed.  Constitutional:      Appearance: She is well-developed. She is not ill-appearing.  HENT:     Head: Normocephalic and atraumatic.     Right Ear: External ear normal.     Left Ear: External ear normal.     Nose: Nose normal.     Mouth/Throat:     Pharynx: Oropharyngeal exudate (Left tonsil) and posterior oropharyngeal erythema (Left tonsil) present.  Eyes:     Extraocular Movements: Extraocular movements intact.     Conjunctiva/sclera: Conjunctivae normal.     Pupils: Pupils are equal, round, and  reactive to light.  Neck:     Musculoskeletal: Normal range of motion and neck supple.     Trachea: Phonation normal.  Cardiovascular:     Rate and Rhythm: Normal rate and regular rhythm.     Heart sounds: Normal heart sounds.  Pulmonary:     Effort: Pulmonary effort is normal.     Breath sounds: Normal breath sounds.  Abdominal:     Palpations: Abdomen is soft.     Tenderness: There is no abdominal tenderness.  Musculoskeletal: Normal range of motion.  Skin:    General: Skin is warm and dry.     Coloration: Skin is not pale.     Findings: No erythema.  Neurological:     Mental Status: She is alert and oriented to person, place, and time.     Cranial Nerves: No cranial nerve deficit.     Sensory: No sensory deficit.     Motor: No abnormal muscle tone.     Coordination:  Coordination normal.  Psychiatric:        Mood and Affect: Mood normal.        Behavior: Behavior normal.        Thought Content: Thought content normal.        Judgment: Judgment normal.      ED Treatments / Results  Labs (all labs ordered are listed, but only abnormal results are displayed) Labs Reviewed - No data to display  EKG None  Radiology No results found.  Procedures Procedures (including critical care time)  Medications Ordered in ED Medications - No data to display   Initial Impression / Assessment and Plan / ED Course  I have reviewed the triage vital signs and the nursing notes.  Pertinent labs & imaging results that were available during my care of the patient were reviewed by me and considered in my medical decision making (see chart for details).      Patient Vitals for the past 24 hrs:  BP Temp Temp src Pulse Resp SpO2 Height Weight  05/23/18 1711 119/84 - - 95 16 100 % - -  05/23/18 1538 132/81 98.2 F (36.8 C) Oral (!) 107 18 99 % 5\' 3"  (1.6 m) 97.5 kg    5:24 PM Reevaluation with update and discussion. After initial assessment and treatment, an updated evaluation reveals no change in clinical status.  Findings discussed with the patient and all questions were answered. Daleen Bo   Medical Decision Making: Evaluation consistent with acute tonsillitis, bacterial.  Doubt deep tissue infection.  Doubt sepsis or metabolic instability.  CRITICAL CARE-no  Nursing Notes Reviewed/ Care Coordinated Applicable Imaging Reviewed Interpretation of Laboratory Data incorporated into ED treatment  The patient appears reasonably screened and/or stabilized for discharge and I doubt any other medical condition or other [facility] requiring further screening, evaluation, or treatment in the ED at this time prior to discharge.  Plan: Home Medications-OTC analgesia of choice; Home Treatments-salt water gargle; return here if the recommended treatment, does not  improve the symptoms; Recommended follow up-PCP, PRN     Final Clinical Impressions(s) / ED Diagnoses   Final diagnoses:  Tonsillitis    ED Discharge Orders         Ordered    azithromycin (ZITHROMAX Z-PAK) 250 MG tablet     05/23/18 1721           Daleen Bo, MD 05/23/18 1724

## 2018-05-23 NOTE — ED Notes (Signed)
Patient verbalizes understanding of discharge instructions. Opportunity for questioning and answers were provided. Armband removed by staff, pt discharged from ED ambulatory.   

## 2018-05-23 NOTE — ED Triage Notes (Signed)
Pt states she has had a sore throat for 2 days. Painful to swallow

## 2018-09-24 ENCOUNTER — Emergency Department (HOSPITAL_COMMUNITY): Payer: BLUE CROSS/BLUE SHIELD

## 2018-09-24 ENCOUNTER — Other Ambulatory Visit: Payer: Self-pay

## 2018-09-24 ENCOUNTER — Encounter (HOSPITAL_COMMUNITY): Payer: Self-pay | Admitting: Emergency Medicine

## 2018-09-24 ENCOUNTER — Emergency Department (HOSPITAL_COMMUNITY)
Admission: EM | Admit: 2018-09-24 | Discharge: 2018-09-24 | Disposition: A | Discharge status: Home / Self Care | Payer: BLUE CROSS/BLUE SHIELD | Attending: Emergency Medicine | Admitting: Emergency Medicine

## 2018-09-24 DIAGNOSIS — R072 Precordial pain: Secondary | ICD-10-CM | POA: Insufficient documentation

## 2018-09-24 DIAGNOSIS — I10 Essential (primary) hypertension: Secondary | ICD-10-CM | POA: Insufficient documentation

## 2018-09-24 DIAGNOSIS — Z87891 Personal history of nicotine dependence: Secondary | ICD-10-CM | POA: Insufficient documentation

## 2018-09-24 DIAGNOSIS — R200 Anesthesia of skin: Secondary | ICD-10-CM | POA: Insufficient documentation

## 2018-09-24 LAB — CBC WITH DIFFERENTIAL/PLATELET
Abs Immature Granulocytes: 0.01 10*3/uL (ref 0.00–0.07)
Basophils Absolute: 0 10*3/uL (ref 0.0–0.1)
Basophils Relative: 1 %
Eosinophils Absolute: 0.1 10*3/uL (ref 0.0–0.5)
Eosinophils Relative: 3 %
HCT: 32 % — ABNORMAL LOW (ref 36.0–46.0)
Hemoglobin: 9.3 g/dL — ABNORMAL LOW (ref 12.0–15.0)
Immature Granulocytes: 0 %
Lymphocytes Relative: 35 %
Lymphs Abs: 1.9 10*3/uL (ref 0.7–4.0)
MCH: 21.4 pg — ABNORMAL LOW (ref 26.0–34.0)
MCHC: 29.1 g/dL — ABNORMAL LOW (ref 30.0–36.0)
MCV: 73.7 fL — ABNORMAL LOW (ref 80.0–100.0)
Monocytes Absolute: 0.6 10*3/uL (ref 0.1–1.0)
Monocytes Relative: 10 %
Neutro Abs: 2.9 10*3/uL (ref 1.7–7.7)
Neutrophils Relative %: 51 %
Platelets: 279 10*3/uL (ref 150–400)
RBC: 4.34 MIL/uL (ref 3.87–5.11)
RDW: 18.9 % — ABNORMAL HIGH (ref 11.5–15.5)
WBC: 5.6 10*3/uL (ref 4.0–10.5)
nRBC: 0 % (ref 0.0–0.2)

## 2018-09-24 LAB — BASIC METABOLIC PANEL
Anion gap: 12 (ref 5–15)
BUN: 14 mg/dL (ref 6–20)
CO2: 20 mmol/L — ABNORMAL LOW (ref 22–32)
Calcium: 9.1 mg/dL (ref 8.9–10.3)
Chloride: 105 mmol/L (ref 98–111)
Creatinine, Ser: 0.7 mg/dL (ref 0.44–1.00)
GFR calc Af Amer: >60 mL/min (ref >60)
GFR calc non Af Amer: >60 mL/min (ref >60)
Glucose, Bld: 78 mg/dL (ref 70–99)
Potassium: 3.6 mmol/L (ref 3.5–5.1)
Sodium: 137 mmol/L (ref 135–145)

## 2018-09-24 LAB — TROPONIN I: Troponin I: <0.03 ng/mL (ref <0.03)

## 2018-09-24 LAB — I-STAT BETA HCG BLOOD, ED (MC, WL, AP ONLY): I-stat hCG, quantitative: <5 m[IU]/mL (ref <5)

## 2018-09-24 MED ORDER — ASPIRIN 81 MG PO CHEW
324.0000 mg | CHEWABLE_TABLET | Freq: Once | ORAL | Status: AC
  Administered 2018-09-24: 17:00:00 324 mg via ORAL
  Filled 2018-09-24: qty 4

## 2018-09-24 MED ORDER — NITROGLYCERIN 0.4 MG SL SUBL: 0.4000 mg | SUBLINGUAL_TABLET | SUBLINGUAL | Status: DC | PRN

## 2018-09-24 MED ORDER — DIAZEPAM 5 MG PO TABS
5.0000 mg | ORAL_TABLET | Freq: Once | ORAL | Status: AC
  Administered 2018-09-24: 19:00:00 5 mg via ORAL
  Filled 2018-09-24: qty 1

## 2018-09-24 NOTE — ED Triage Notes (Signed)
Pt complains of CP and left sided numbness x3 days. Pt describes it as a pressure pain.

## 2018-09-24 NOTE — Discharge Instructions (Addendum)
We saw you in the ER for the chest pain and numbness. All of our cardiac workup is normal, including labs, EKG and chest X-RAY are normal.  We also did MRI of your brain because of your left-sided numbness, and it too is negative.  We are not sure what is causing your discomfort, but we feel comfortable sending you home at this time. The workup in the ER is not complete, and you should follow up with your primary care doctor for further evaluation.

## 2018-09-24 NOTE — ED Provider Notes (Signed)
[place] EMERGENCY DEPARTMENT Provider Note   CSN: [id number] Arrival date & time: 09/24/18  1537    History   Chief Complaint No chief complaint on file.   HPI Linda Thomas is a 38 y.o. female.  HPI: A 38 year old patient with a history of hypertension and obesity presents for evaluation of chest pain. Initial onset of pain was more than 6 hours ago. The patient's chest pain is described as heaviness/pressure/tightness and is worse with exertion. The patient's chest pain is middle- or left-sided, is not well-localized, is not sharp and does not radiate to the arms/jaw/neck. The patient does not complain of nausea and denies diaphoresis. The patient has no history of stroke, has no history of peripheral artery disease, has not smoked in the past 90 days, denies any history of treated diabetes, has no relevant family history of coronary artery disease (first degree relative at less than age 82) and has no history of hypercholesterolemia.   Patient reports that her pain is been intermittently present for the last 3 days.  The chest pain Is midsternal, dull and pressure like.  She has left upper extremity numbness that appears to be independent of the chest pain and started yesterday.  Numbness is constant and located in the upper extremity only.  HPI Past Medical History:  Diagnosis Date   Bell's palsy    Hypertension during pregnancy    Patient Active Problem List   Diagnosis Date Noted   Bell's palsy 06/21/2016   History of hypertension 06/21/2016    Past Surgical History:  Procedure Laterality Date   CESAREAN SECTION     TUBAL LIGATION       OB History   No obstetric history on file.      Home Medications    Prior to Admission medications   Medication Sig Start Date End Date Taking? Authorizing Provider  hydrochlorothiazide (HYDRODIURIL) 25 MG tablet Take 1 tablet (25 mg total) by mouth daily. 02/21/18  Yes Cardama, Grayce Sessions, MD     Family History No family history on file.  Social History Social History   Tobacco Use   Smoking status: Former Smoker   Smokeless tobacco: Never Used  Substance Use Topics   Alcohol use: Yes    Comment: social   Drug use: No     Allergies   Other and Penicillins   Review of Systems Review of Systems  Constitutional: Positive for activity change.  Respiratory: Negative for shortness of breath.   Cardiovascular: Positive for chest pain.  Gastrointestinal: Negative for nausea and vomiting.  Neurological: Positive for numbness.  All other systems reviewed and are negative.    Physical Exam Updated Vital Signs BP (!) 141/83    Pulse 72    Temp 98.5 F (36.9 C) (Oral)    Resp 12    Ht 5\' 3"  (1.6 m)    Wt 99.8 kg    SpO2 100%    BMI 38.97 kg/m   Physical Exam Vitals signs and nursing note reviewed.  Constitutional:      Appearance: She is well-developed.  HENT:     Head: Normocephalic and atraumatic.  Eyes:     Extraocular Movements: Extraocular movements intact.     Pupils: Pupils are equal, round, and reactive to light.  Neck:     Musculoskeletal: Normal range of motion and neck supple.  Cardiovascular:     Rate and Rhythm: Normal rate.  Pulmonary:     Effort: Pulmonary effort is normal.  Abdominal:     General: Bowel sounds are normal.  Skin:    General: Skin is warm and dry.  Neurological:     Mental Status: She is alert and oriented to person, place, and time.     Cranial Nerves: No cranial nerve deficit.     Sensory: Sensory deficit present.     Motor: No weakness.     Coordination: Coordination normal.     Gait: Gait normal.     Deep Tendon Reflexes: Reflexes normal.     Comments: Subjective numbness of the left upper extremity.      ED Treatments / Results  Labs (all labs ordered are listed, but only abnormal results are displayed) Labs Reviewed  BASIC METABOLIC PANEL - Abnormal; Notable for the following components:      Result  Value   CO2 20 (*)    All other components within normal limits  CBC WITH DIFFERENTIAL/PLATELET - Abnormal; Notable for the following components:   Hemoglobin 9.3 (*)    HCT 32.0 (*)    MCV 73.7 (*)    MCH 21.4 (*)    MCHC 29.1 (*)    RDW 18.9 (*)    All other components within normal limits  TROPONIN I  I-STAT BETA HCG BLOOD, ED ([facility], [facility], [facility] ONLY)    EKG EKG Interpretation  Date/Time:  Monday September 24 2018 15:39:59 EDT Ventricular Rate:  107 PR Interval:  166 QRS Duration: 88 QT Interval:  370 QTC Calculation: 493 R Axis:   50 Text Interpretation:  Sinus tachycardia Otherwise normal ECG No acute changes No significant change since last tracing Confirmed by Varney Biles [phone]) on 09/24/2018 4:32:30 PM   Radiology Mr Brain Wo Contrast  Result Date: 09/24/2018 CLINICAL DATA:  Chest pain and left-sided numbness EXAM: MRI HEAD WITHOUT CONTRAST TECHNIQUE: Multiplanar, multiecho pulse sequences of the brain and surrounding structures were obtained without intravenous contrast. COMPARISON:  Head CT 08/08/2017 FINDINGS: BRAIN: There is no acute infarct, acute hemorrhage or extra-axial collection. The midline structures are normal. No midline shift or other mass effect. The white matter signal is normal for the patient's age. The cerebral and cerebellar volume are age-appropriate. No hydrocephalus. Susceptibility-sensitive sequences show no chronic microhemorrhage or superficial siderosis. No mass lesion. VASCULAR: The major intracranial arterial and venous sinus flow voids are normal. SKULL AND UPPER CERVICAL SPINE: Calvarial bone marrow signal is normal. There is no skull base mass. Visualized upper cervical spine and soft tissues are normal. SINUSES/ORBITS: No fluid levels or advanced mucosal thickening. No mastoid or middle ear effusion. The orbits are normal. IMPRESSION: Normal brain MRI. Electronically Signed   By: Ulyses Jarred M.D.   On: 09/24/2018 20:34   Dg Chest Portable 1  View  Result Date: 09/24/2018 CLINICAL DATA:  Chest pain. EXAM: PORTABLE CHEST 1 VIEW COMPARISON:  Chest x-ray dated February 20, 2018. FINDINGS: The heart size and mediastinal contours are within normal limits. Both lungs are clear. The visualized skeletal structures are unremarkable. IMPRESSION: No active disease. Electronically Signed   By: Titus Dubin M.D.   On: 09/24/2018 17:26    Procedures Procedures (including critical care time)  Medications Ordered in ED Medications  nitroGLYCERIN (NITROSTAT) SL tablet 0.4 mg (has no administration in time range)  aspirin chewable tablet 324 mg (324 mg Oral Given 09/24/18 1729)  diazepam (VALIUM) tablet 5 mg (5 mg Oral Given 09/24/18 1924)     Initial Impression / Assessment and Plan / ED Course  I have reviewed  the triage vital signs and the nursing notes.  Pertinent labs & imaging results that were available during my care of the patient were reviewed by me and considered in my medical decision making (see chart for details).     HEAR Score: 2 Patient comes in a chief complaint of chest pain and left-sided numbness. Appears that the chest pain started 3 days ago, none that started today.  They are unrelated.  She has a nonfocal neurologic exam aside from subjective numbness in the left face and L extremity.  Hear score is only 2.  EKG is not showing any acute findings. Delta troponin ordered and negative. MRI of the brain ordered which is also negative. Results from the ER workup discussed with the patient face to face and all questions answered to the best of my ability. Strict ER return precautions have been discussed, and patient is agreeing with the plan and is comfortable with the workup done and the recommendations from the ER.    Final Clinical Impressions(s) / ED Diagnoses   Final diagnoses:  Precordial chest pain  Numbness    ED Discharge Orders    None       Varney Biles, MD 09/25/18 0001

## 2018-09-24 NOTE — ED Notes (Signed)
Patient verbalizes understanding of discharge instructions. Opportunity for questioning and answers were provided. Armband removed by staff, pt discharged from ED ambulatory to home.  

## 2018-09-24 NOTE — ED Notes (Signed)
Patient transported to MRI 

## 2019-02-19 IMAGING — CT CT HEAD W/O CM
4 series · 17 of 47 positions shown, 19 images · non-contrast
Comparison: None.

CLINICAL DATA: Headache

EXAM:
CT HEAD WITHOUT CONTRAST
TECHNIQUE: Contiguous axial images were obtained from the base of the skull
through the vertex without intravenous contrast.

[Series 3: head wo · axial · 0.40mm/px · z∈[-134,-14]mm · 7 of 34 slices shown, 9 images]
[im 5/34  brain]
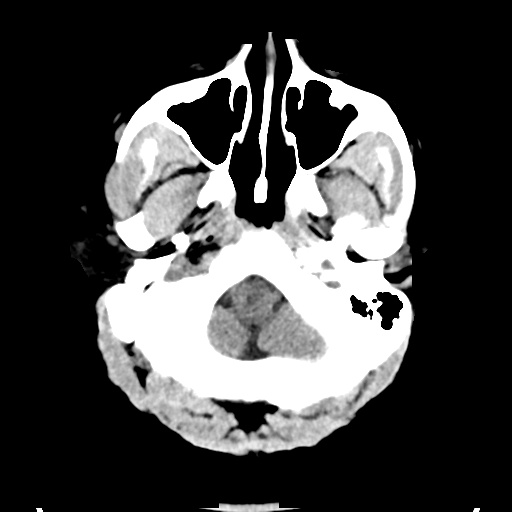
[im 5/34  bone]
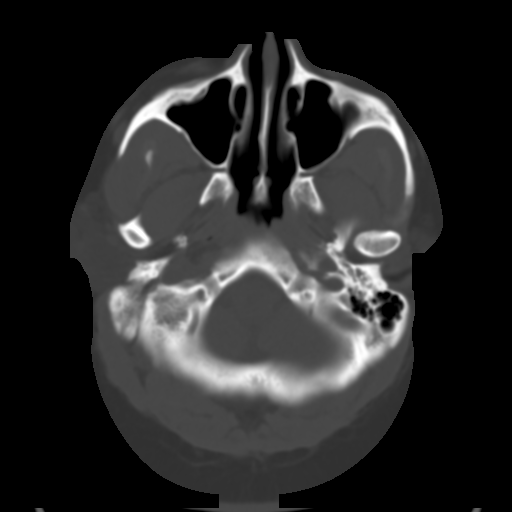
[im 9/34  brain]
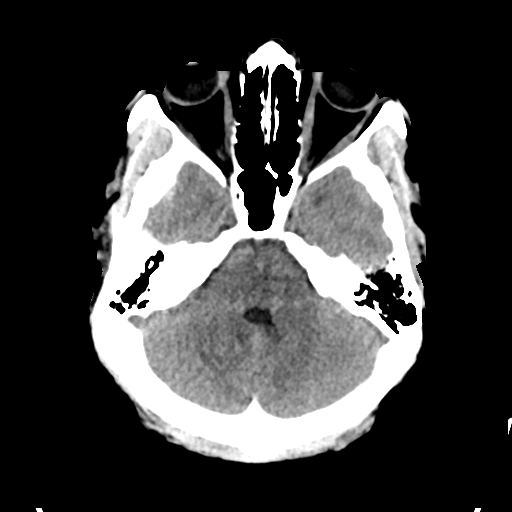
[im 13/34  brain]
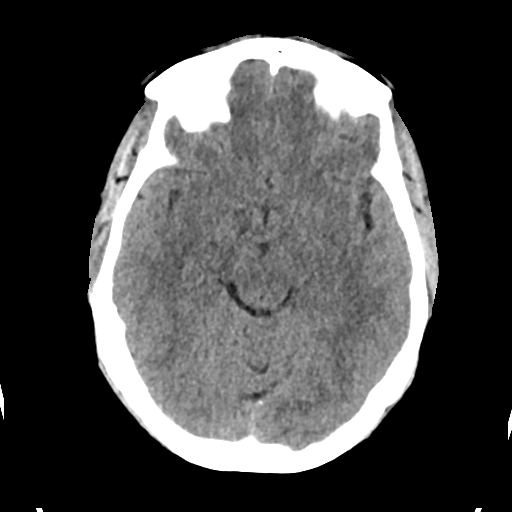
[im 17/34  brain]
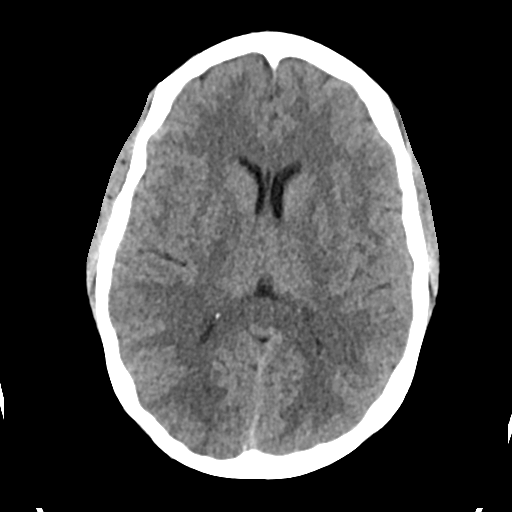
[im 21/34  brain]
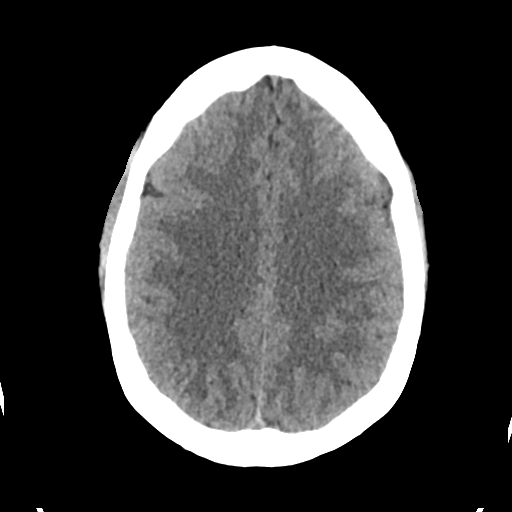
[im 21/34  bone]
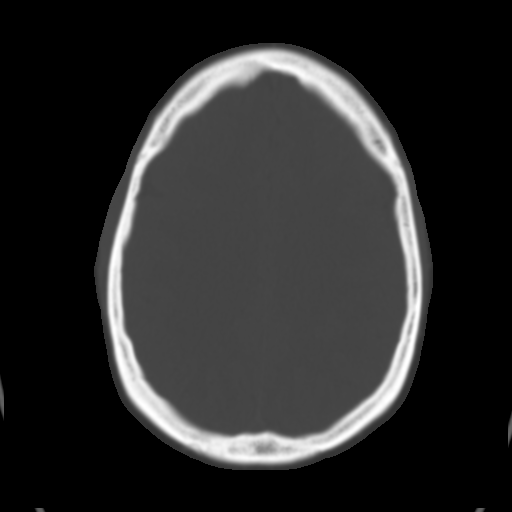
[im 25/34  brain]
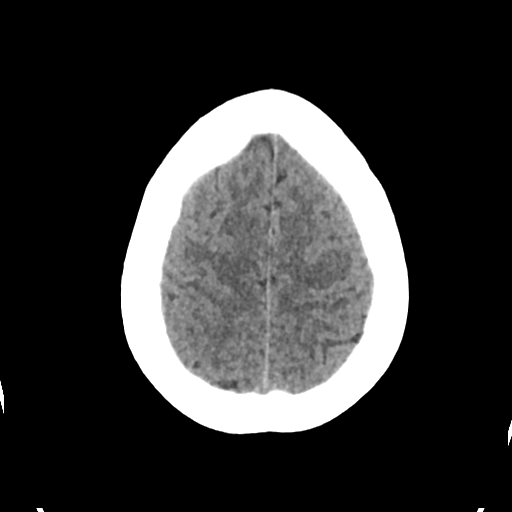
[im 29/34  brain]
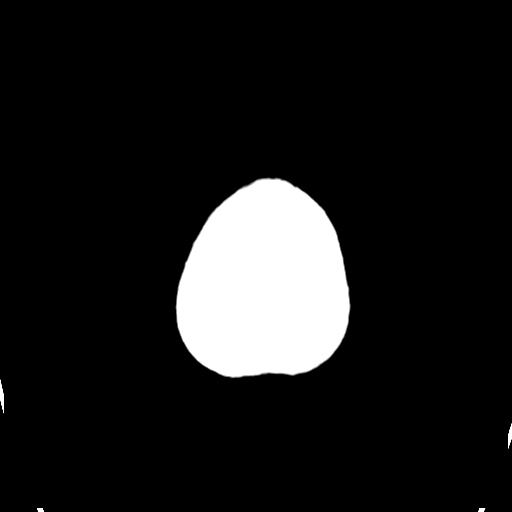

[Series 4: head bone · axial · 0.40mm/px · z∈[-138,-80]mm · 4 of 84 slices shown]
[im 9/84  bone]
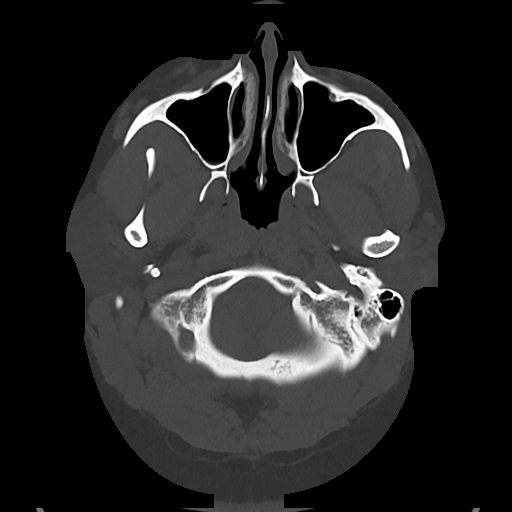
[im 17/84  bone]
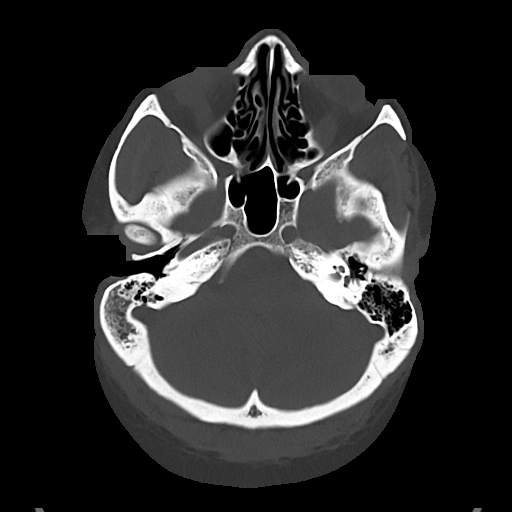
[im 25/84  bone]
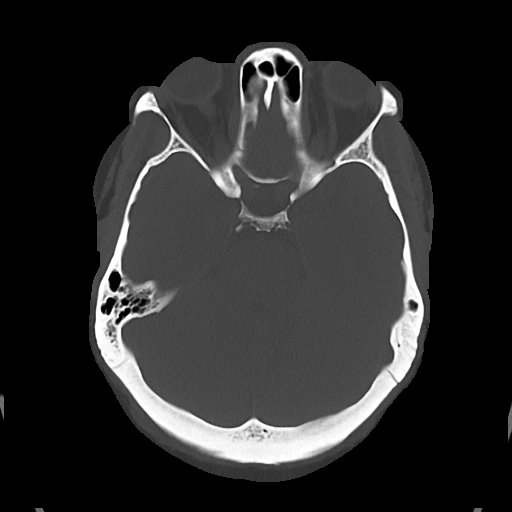
[im 38/84  bone]
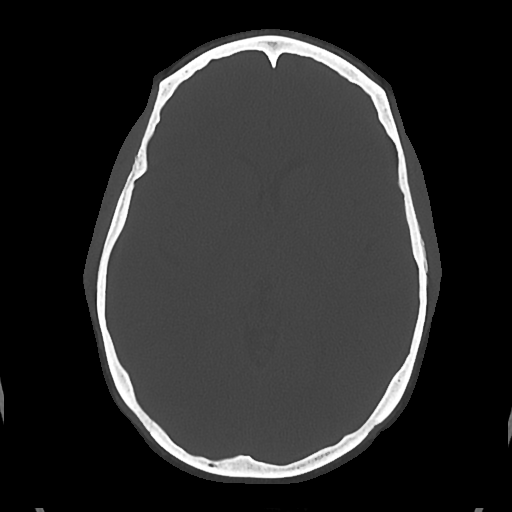

[Series 5: cor soft · coronal · 0.33mm/px · 3 of 67 slices shown]
[im 23/67  brain]
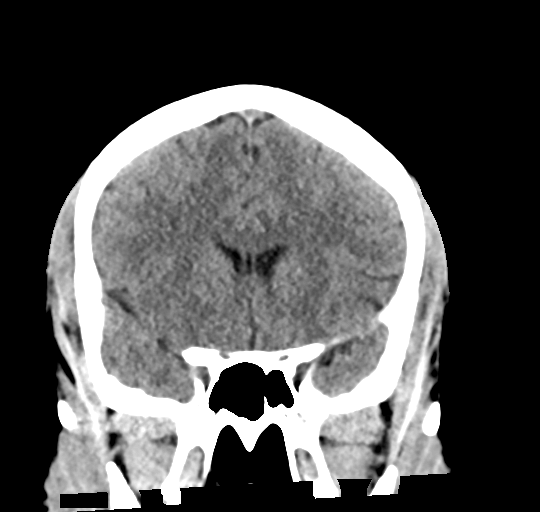
[im 30/67  brain]
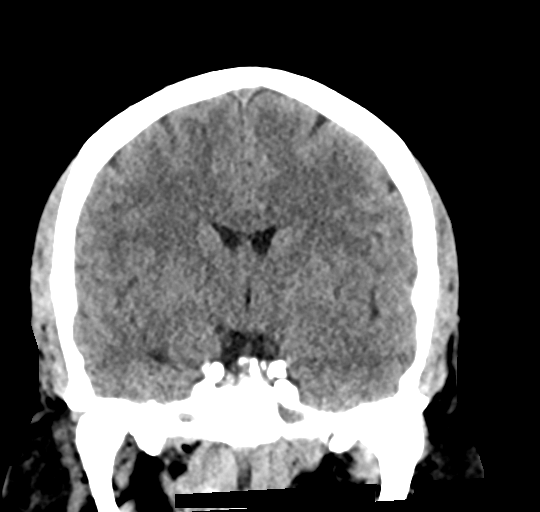
[im 37/67  brain]
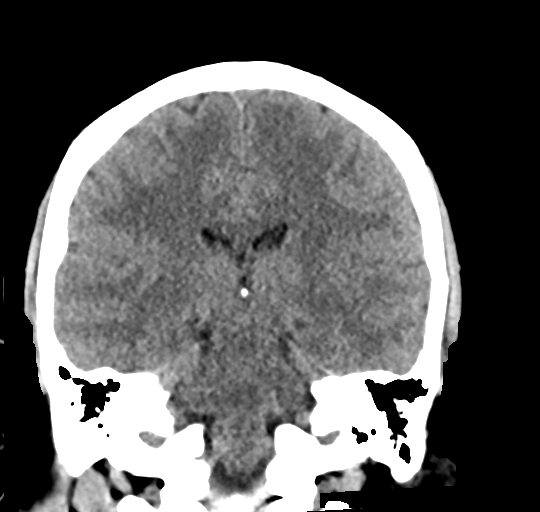

[Series 6: sag soft · sagittal · 0.33mm/px · 3 of 55 slices shown]
[im 19/55  brain]
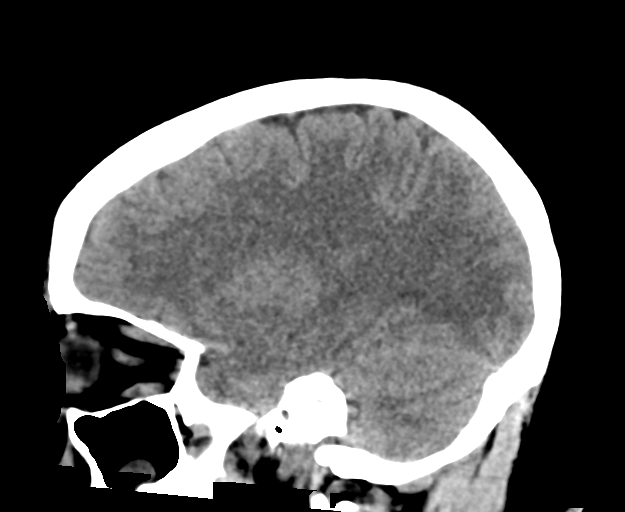
[im 28/55  brain]
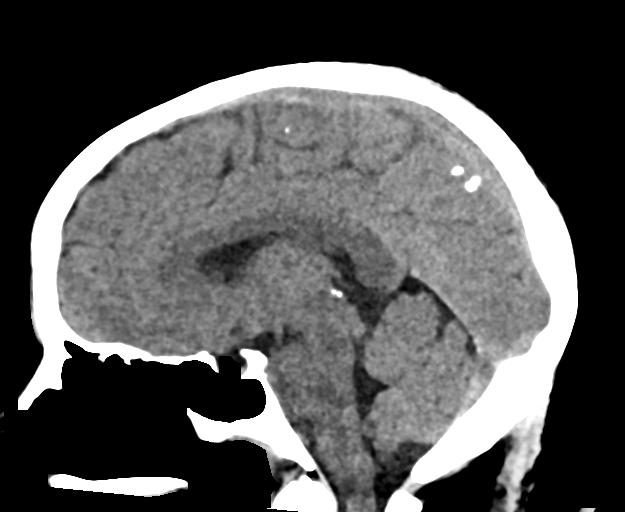
[im 37/55  brain]
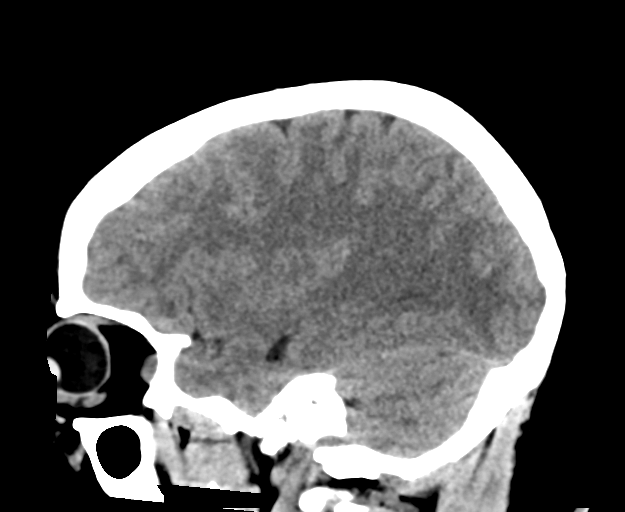

[17 of 47 positions shown; findings below may reference images not displayed]

FINDINGS: Brain: No evidence of acute infarction, hemorrhage, hydrocephalus,
extra-axial collection or mass lesion/mass effect.

Vascular: No hyperdense vessel or unexpected calcification.

Skull: Normal. Negative for fracture or focal lesion.

Sinuses/Orbits: No acute finding.

Other: None
IMPRESSION: Negative non contrasted CT appearance of the brain.

## 2019-03-14 ENCOUNTER — Other Ambulatory Visit: Payer: Self-pay

## 2019-03-14 ENCOUNTER — Encounter (HOSPITAL_COMMUNITY): Payer: Self-pay | Admitting: Emergency Medicine

## 2019-03-14 ENCOUNTER — Emergency Department (HOSPITAL_COMMUNITY)
Admission: EM | Admit: 2019-03-14 | Discharge: 2019-03-14 | Disposition: A | Discharge status: Home / Self Care | Payer: Medicaid Other | Attending: Emergency Medicine | Admitting: Emergency Medicine

## 2019-03-14 DIAGNOSIS — Z87891 Personal history of nicotine dependence: Secondary | ICD-10-CM | POA: Diagnosis not present

## 2019-03-14 DIAGNOSIS — N939 Abnormal uterine and vaginal bleeding, unspecified: Secondary | ICD-10-CM | POA: Insufficient documentation

## 2019-03-14 DIAGNOSIS — I10 Essential (primary) hypertension: Secondary | ICD-10-CM | POA: Insufficient documentation

## 2019-03-14 LAB — I-STAT BETA HCG BLOOD, ED (MC, WL, AP ONLY): I-stat hCG, quantitative: <5 m[IU]/mL (ref <5)

## 2019-03-14 NOTE — ED Notes (Signed)
Patient verbalizes understanding of discharge instructions. Opportunity for questioning and answers were provided. Armband removed by staff, pt discharged from ED ambulatory to home.  

## 2019-03-14 NOTE — ED Notes (Signed)
Per [place], [place], pt does not need the CBC to be collected

## 2019-03-14 NOTE — Discharge Instructions (Addendum)
Please read attached information. If you experience any new or worsening signs or symptoms please return to the emergency room for evaluation. Please follow-up with your primary care provider or specialist as discussed.  °

## 2019-03-14 NOTE — ED Triage Notes (Signed)
Pt reports being on her menstrual cycle since 9/14. Pt reports no OBGYN so she came into the ED instead.

## 2019-03-14 NOTE — ED Provider Notes (Signed)
[facility] EMERGENCY DEPARTMENT Provider Note   CSN: [id number] Arrival date & time: 03/14/19  [id number]     History   Chief Complaint Chief Complaint  Patient presents with  . Vaginal Bleeding    HPI Linda Thomas is a 38 y.o. female.     HPI   60 YOF presents today with complaints of vaginal bleeding.  Patient notes she started her menstrual cycle at 914 approximately 2 weeks ago.  She notes alternating light and heavy vaginal bleeding.  She notes that it feels like it is starting to lighten up presently.  She denies any pelvic pain vaginal discharge.  She is sexually active.  No dizziness upon standing weakness.  She is not on blood thinners and does not have any coagulopathies.  Past Medical History:  Diagnosis Date  . Bell's palsy   . Hypertension during pregnancy    Patient Active Problem List   Diagnosis Date Noted  . Bell's palsy 06/21/2016  . History of hypertension 06/21/2016    Past Surgical History:  Procedure Laterality Date  . CESAREAN SECTION    . TUBAL LIGATION       OB History   No obstetric history on file.      Home Medications    Prior to Admission medications   Medication Sig Start Date End Date Taking? Authorizing Provider  hydrochlorothiazide (HYDRODIURIL) 25 MG tablet Take 1 tablet (25 mg total) by mouth daily. 02/21/18   Fatima Blank, MD    Family History No family history on file.  Social History Social History   Tobacco Use  . Smoking status: Former Research scientist (life sciences)  . Smokeless tobacco: Never Used  Substance Use Topics  . Alcohol use: Yes    Comment: social  . Drug use: No     Allergies   Other and Penicillins   Review of Systems Review of Systems  All other systems reviewed and are negative.    Physical Exam Updated Vital Signs BP (!) 155/86 (BP Location: Left Arm)   Pulse 82   Temp 98 F (36.7 C) (Oral)   Resp 14   Ht 5\' 3"  (1.6 m)   Wt 104.3 kg   LMP 02/18/2019   SpO2  98%   BMI 40.74 kg/m   Physical Exam Vitals signs and nursing note reviewed.  Constitutional:      Appearance: She is well-developed.  HENT:     Head: Normocephalic and atraumatic.  Eyes:     General: No scleral icterus.       Right eye: No discharge.        Left eye: No discharge.     Conjunctiva/sclera: Conjunctivae normal.     Pupils: Pupils are equal, round, and reactive to light.  Neck:     Musculoskeletal: Normal range of motion.     Vascular: No JVD.     Trachea: No tracheal deviation.  Pulmonary:     Effort: Pulmonary effort is normal.     Breath sounds: No stridor.  Genitourinary:    Comments: Small amount of blood noted in the vaginal vault no active bleeding, no lesions, no discharge Neurological:     Mental Status: She is alert and oriented to person, place, and time.     Coordination: Coordination normal.  Psychiatric:        Behavior: Behavior normal.        Thought Content: Thought content normal.        Judgment: Judgment normal.  ED Treatments / Results  Labs (all labs ordered are listed, but only abnormal results are displayed) Labs Reviewed  I-STAT BETA HCG BLOOD, ED ([facility], [facility], [facility] ONLY)    EKG None  Radiology No results found.  Procedures Procedures (including critical care time)  Medications Ordered in ED Medications - No data to display   Initial Impression / Assessment and Plan / ED Course  I have reviewed the triage vital signs and the nursing notes.  Pertinent labs & imaging results that were available during my care of the patient were reviewed by me and considered in my medical decision making (see chart for details).       72o-year-old female presents today with vaginal bleeding.  This is minimal on my exam, no signs of symptomatic anemia, stable for discharge and outpatient follow-up.  Strict return cautions given.  She verbalized understanding and agreement to today's plan.  Final Clinical Impressions(s) / ED Diagnoses    Final diagnoses:  Vaginal bleeding    ED Discharge Orders    None       Okey Regal, PA-C 03/14/19 [place], MD 03/15/19 [phone]

## 2019-04-01 ENCOUNTER — Ambulatory Visit (INDEPENDENT_AMBULATORY_CARE_PROVIDER_SITE_OTHER): Payer: Self-pay | Admitting: Nurse Practitioner

## 2019-04-01 ENCOUNTER — Emergency Department (HOSPITAL_COMMUNITY)
Admission: EM | Admit: 2019-04-01 | Discharge: 2019-04-01 | Disposition: A | Discharge status: Home / Self Care | Payer: Medicaid Other | Attending: Emergency Medicine | Admitting: Emergency Medicine

## 2019-04-01 ENCOUNTER — Encounter: Payer: Self-pay | Admitting: Nurse Practitioner

## 2019-04-01 ENCOUNTER — Other Ambulatory Visit (HOSPITAL_COMMUNITY)
Admission: RE | Admit: 2019-04-01 | Discharge: 2019-04-01 | Disposition: A | Discharge status: Home / Self Care | Payer: Self-pay | Source: Ambulatory Visit | Attending: Nurse Practitioner | Admitting: Nurse Practitioner

## 2019-04-01 ENCOUNTER — Other Ambulatory Visit: Payer: Self-pay

## 2019-04-01 ENCOUNTER — Encounter (HOSPITAL_COMMUNITY): Payer: Self-pay | Admitting: Emergency Medicine

## 2019-04-01 VITALS — BP 140/92 | HR 108 | Wt 229.3 lb

## 2019-04-01 DIAGNOSIS — Z6841 Body Mass Index (BMI) 40.0 and over, adult: Secondary | ICD-10-CM

## 2019-04-01 DIAGNOSIS — Z01419 Encounter for gynecological examination (general) (routine) without abnormal findings: Secondary | ICD-10-CM | POA: Diagnosis not present

## 2019-04-01 DIAGNOSIS — N939 Abnormal uterine and vaginal bleeding, unspecified: Secondary | ICD-10-CM

## 2019-04-01 DIAGNOSIS — Z1151 Encounter for screening for human papillomavirus (HPV): Secondary | ICD-10-CM

## 2019-04-01 DIAGNOSIS — Z1231 Encounter for screening mammogram for malignant neoplasm of breast: Secondary | ICD-10-CM

## 2019-04-01 DIAGNOSIS — Z5321 Procedure and treatment not carried out due to patient leaving prior to being seen by health care provider: Secondary | ICD-10-CM | POA: Diagnosis not present

## 2019-04-01 DIAGNOSIS — Z124 Encounter for screening for malignant neoplasm of cervix: Secondary | ICD-10-CM

## 2019-04-01 DIAGNOSIS — Z23 Encounter for immunization: Secondary | ICD-10-CM

## 2019-04-01 DIAGNOSIS — Z113 Encounter for screening for infections with a predominantly sexual mode of transmission: Secondary | ICD-10-CM

## 2019-04-01 DIAGNOSIS — I10 Essential (primary) hypertension: Secondary | ICD-10-CM

## 2019-04-01 DIAGNOSIS — N898 Other specified noninflammatory disorders of vagina: Secondary | ICD-10-CM

## 2019-04-01 MED ORDER — MEDROXYPROGESTERONE ACETATE 10 MG PO TABS: 10.0000 mg | ORAL_TABLET | Freq: Every day | ORAL | 0 refills | Status: DC

## 2019-04-01 NOTE — Patient Instructions (Signed)
Abnormal Uterine Bleeding °Abnormal uterine bleeding means bleeding more than usual from your uterus. It can include: °· Bleeding between periods. °· Bleeding after sex. °· Bleeding that is heavier than normal. °· Periods that last longer than usual. °· Bleeding after you have stopped having your period (menopause). °There are many problems that may cause this. You should see a doctor for any kind of bleeding that is not normal. Treatment depends on the cause of the bleeding. °Follow these instructions at home: °· Watch your condition for any changes. °· Do not use tampons, douche, or have sex, if your doctor tells you not to. °· Change your pads often. °· Get regular well-woman exams. Make sure they include a pelvic exam and cervical cancer screening. °· Keep all follow-up visits as told by your doctor. This is important. °Contact a doctor if: °· The bleeding lasts more than one week. °· You feel dizzy at times. °· You feel like you are going to throw up (nauseous). °· You throw up. °Get help right away if: °· You pass out. °· You have to change pads every hour. °· You have belly (abdominal) pain. °· You have a fever. °· You get sweaty. °· You get weak. °· You passing large blood clots from your vagina. °Summary °· Abnormal uterine bleeding means bleeding more than usual from your uterus. °· There are many problems that may cause this. You should see a doctor for any kind of bleeding that is not normal. °· Treatment depends on the cause of the bleeding. °This information is not intended to replace advice given to you by your health care provider. Make sure you discuss any questions you have with your health care provider. °Document Released: 03/20/2009 Document Revised: 05/17/2016 Document Reviewed: 05/17/2016 °Elsevier Patient Education © 2020 [employer] Inc[employer] ° °

## 2019-04-01 NOTE — Progress Notes (Signed)
[place] scheduled for November 16th @ 1045.  Pt notified.

## 2019-04-01 NOTE — Progress Notes (Signed)
GYNECOLOGY ANNUAL PREVENTATIVE CARE ENCOUNTER NOTE  Subjective:   Linda Thomas is a 38 y.o. here in the walk in clinic today for abnormal vaginal bleeding.  Is having tachycardia, periodic dizziness and heavy bleeding for several days.  She has changed pads every couple of hours today.  History of regular periods 7 days each month followed by 3 months of no period and then started bleeding about Sept. 14.  Has bled constantly since then but the amount of the bleeding varies.  Now she is having some of the heaviest bleeding she has been having since it started.  She has been seen once in the ER on 03-14-19 for this bleeding.  Her pregnancy test was negative and her HGB was 9 at that time.  She was to follow up with GYN and her appointment is with our clinic on Nov. 18.  But for the past few days her bleeding has been so heavy that she needed to be seen sooner.  She has had a tubal ligation so will not repeat pregnancy test today.  She has applied for Medicaid and her worker has said she would get full Medicaid and her past bills for this problem would be covered.  So will treat this visit today as initial workup for abnormal vaginal bleeding.  Visit note for 03-14-19 reviewed and labs reviewed.   No obstetric history on file. female here for a routine annual gynecologic exam.  Current complaints: abnormal vaginal bleeding since September and now has dizziness and weakness.   Denies abnormal vaginal discharge, pelvic pain, problems with intercourse or other gynecologic concerns.  Has been diagnosed with HTN but is not currently on medication for many months.   Gynecologic History Patient's last menstrual period was 02/18/2019 (exact date). Contraception: tubal ligation Last Pap: unknown.  Last mammogram: has never had  Obstetric History OB History  No obstetric history on file.    Past Medical History:  Diagnosis Date  . Bell's palsy   . Hypertension during pregnancy    Past  Surgical History:  Procedure Laterality Date  . CESAREAN SECTION    . TUBAL LIGATION      No current outpatient medications on file prior to visit.   No current facility-administered medications on file prior to visit.     Allergies  Allergen Reactions  . Other Itching    Reaction to unknown antibiotic at age 35  . Penicillins Itching    Has patient had a PCN reaction causing immediate rash, facial/tongue/throat swelling, SOB or lightheadedness with hypotension: No Has patient had a PCN reaction causing severe rash involving mucus membranes or skin necrosis: No Has patient had a PCN reaction that required hospitalization: No Has patient had a PCN reaction occurring within the last 10 years: No If all of the above answers are "NO", then may proceed with Cephalosporin use.     Social History   Socioeconomic History  . Marital status: Single    Spouse name: Not on file  . Number of children: Not on file  . Years of education: Not on file  . Highest education level: Not on file  Occupational History  . Not on file  Social Needs  . Financial resource strain: Not on file  . Food insecurity    Worry: Not on file    Inability: Not on file  . Transportation needs    Medical: Not on file    Non-medical: Not on file  Tobacco Use  . Smoking  status: Former Research scientist (life sciences)  . Smokeless tobacco: Never Used  Substance and Sexual Activity  . Alcohol use: Yes    Comment: social  . Drug use: No  . Sexual activity: Yes    Birth control/protection: None, Surgical  Lifestyle  . Physical activity    Days per week: Not on file    Minutes per session: Not on file  . Stress: Not on file  Relationships  . Social Herbalist on phone: Not on file    Gets together: Not on file    Attends religious service: Not on file    Active member of club or organization: Not on file    Attends meetings of clubs or organizations: Not on file    Relationship status: Not on file  . Intimate  partner violence    Fear of current or ex partner: Not on file    Emotionally abused: Not on file    Physically abused: Not on file    Forced sexual activity: Not on file  Other Topics Concern  . Not on file  Social History Narrative  . Not on file    History reviewed. No pertinent family history.  The following portions of the patient's history were reviewed and updated as appropriate: allergies, current medications, past family history, past medical history, past social history, past surgical history and problem list.  Review of Systems Pertinent items noted in HPI and remainder of comprehensive ROS otherwise negative.   Objective:  BP (!) 140/92   Pulse (!) 108   Wt 229 lb 4.8 oz (104 kg)   LMP 02/18/2019 (Exact Date)   BMI 40.62 kg/m  CONSTITUTIONAL: Well-developed, well-nourished female in no acute distress.  HENT:  Normocephalic, atraumatic, External right and left ear normal.  EYES: Conjunctivae and EOM are normal. Pupils are equal, round.  No scleral icterus.  NECK: Normal range of motion, supple, no masses.   SKIN: Skin is warm and dry. No rash noted. Not diaphoretic. No erythema. No pallor. NEUROLOGIC: Alert and oriented to person, place, and time. Normal reflexes, muscle tone coordination. No cranial nerve deficit noted. PSYCHIATRIC: Normal mood and affect. Normal behavior. Normal judgment and thought content. CARDIOVASCULAR: Normal heart rate noted, regular rhythm RESPIRATORY: Clear to auscultation bilaterally. Effort and breath sounds normal, no problems with respiration noted. BREASTS: Symmetric in size. No masses, skin changes, nipple drainage, or lymphadenopathy. ABDOMEN: Soft, no distention noted.  No tenderness, rebound or guarding.  PELVIC: Normal appearing external genitalia; normal appearing vaginal mucosa and cervix.  Heavy amount of red liquid blood noted in vagina.  Cleared with 5 fox swabs - one clot extracted.  Clot seen in cervix and blood quickly  pooling in vault during swabs for infection and Pap.  Pap smear obtained. Unable to size uterus due to habitus, no other palpable masses, no uterine or adnexal tenderness. MUSCULOSKELETAL: Normal range of motion. No tenderness.  No cyanosis, clubbing, or edema.    Assessment and Plan:  1. Hypertension, unspecified type Is not taking medication at this time.  Does not have primary care MD.  Does not currently have an insurance card, but has applied and is expecting Medicaid card.  2. Abnormal vaginal bleeding Will prescribe Provera 10 mg for 21 days to stop bleeding until she returns for her appointment on Nov. 18.  Advised that bleeding is expected to stop and that it will return after she finishes her medication.  Will follow up with MD and may need a endometrial  biopsy. Will schedule Pelvic US on Nov 16 before her appointment. Checking CBC and STD labs today.  Will follow up results of pap smear and manage accordingly. Mammogram scheduled for December 2020 Routine preventative health maintenance measures emphasized. Please refer to After Visit Summary for other counseling recommendations.    Earlie Server, RN, MSN, NP-BC Nurse Practitioner, [place] for [facility]

## 2019-04-01 NOTE — ED Triage Notes (Signed)
Pt reports being on her menstrual cycle since 9/14 and same has not gotten any better. Pt reports menstration has actually gotten heavier.

## 2019-04-02 LAB — RPR: RPR Ser Ql: NONREACTIVE

## 2019-04-02 LAB — CBC
Hematocrit: 25.1 % — ABNORMAL LOW (ref 34.0–46.6)
Hemoglobin: 7.7 g/dL — ABNORMAL LOW (ref 11.1–15.9)
MCH: 23.1 pg — ABNORMAL LOW (ref 26.6–33.0)
MCHC: 30.7 g/dL — ABNORMAL LOW (ref 31.5–35.7)
MCV: 75 fL — ABNORMAL LOW (ref 79–97)
Platelets: 309 10*3/uL (ref 150–450)
RBC: 3.33 x10E6/uL — ABNORMAL LOW (ref 3.77–5.28)
RDW: 16.1 % — ABNORMAL HIGH (ref 11.7–15.4)
WBC: 5.3 10*3/uL (ref 3.4–10.8)

## 2019-04-02 LAB — HIV ANTIBODY (ROUTINE TESTING W REFLEX): HIV Screen 4th Generation wRfx: NONREACTIVE

## 2019-04-02 LAB — HEPATITIS B SURFACE ANTIGEN: Hepatitis B Surface Ag: NEGATIVE

## 2019-04-02 LAB — HEPATITIS C ANTIBODY: Hep C Virus Ab: <0.1 s/co ratio (ref 0.0–0.9)

## 2019-04-05 LAB — CERVICOVAGINAL ANCILLARY ONLY
Bacterial Vaginitis (gardnerella): NEGATIVE
Candida Glabrata: NEGATIVE
Candida Vaginitis: NEGATIVE
Chlamydia: NEGATIVE
Comment: NEGATIVE
Comment: NEGATIVE
Comment: NEGATIVE
Comment: NEGATIVE
Comment: NEGATIVE
Comment: NORMAL
Neisseria Gonorrhea: NEGATIVE
Trichomonas: NEGATIVE

## 2019-04-05 LAB — CYTOLOGY - PAP
Comment: NEGATIVE
Diagnosis: NEGATIVE
High risk HPV: NEGATIVE

## 2019-04-16 ENCOUNTER — Encounter (HOSPITAL_COMMUNITY): Payer: Self-pay | Admitting: Emergency Medicine

## 2019-04-16 ENCOUNTER — Other Ambulatory Visit: Payer: Self-pay

## 2019-04-16 ENCOUNTER — Inpatient Hospital Stay (HOSPITAL_COMMUNITY)
Admission: EM | Admit: 2019-04-16 | Discharge: 2019-04-18 | DRG: 761 | Disposition: A | Discharge status: Home / Self Care | Payer: Medicaid Other | Attending: Internal Medicine | Admitting: Internal Medicine

## 2019-04-16 DIAGNOSIS — Z20828 Contact with and (suspected) exposure to other viral communicable diseases: Secondary | ICD-10-CM | POA: Diagnosis present

## 2019-04-16 DIAGNOSIS — G510 Bell's palsy: Secondary | ICD-10-CM | POA: Diagnosis present

## 2019-04-16 DIAGNOSIS — R002 Palpitations: Secondary | ICD-10-CM | POA: Diagnosis not present

## 2019-04-16 DIAGNOSIS — Z880 Allergy status to penicillin: Secondary | ICD-10-CM

## 2019-04-16 DIAGNOSIS — N920 Excessive and frequent menstruation with regular cycle: Principal | ICD-10-CM | POA: Diagnosis present

## 2019-04-16 DIAGNOSIS — Z888 Allergy status to other drugs, medicaments and biological substances status: Secondary | ICD-10-CM

## 2019-04-16 DIAGNOSIS — D649 Anemia, unspecified: Secondary | ICD-10-CM | POA: Diagnosis present

## 2019-04-16 DIAGNOSIS — D509 Iron deficiency anemia, unspecified: Secondary | ICD-10-CM | POA: Diagnosis present

## 2019-04-16 DIAGNOSIS — Z793 Long term (current) use of hormonal contraceptives: Secondary | ICD-10-CM

## 2019-04-16 DIAGNOSIS — N938 Other specified abnormal uterine and vaginal bleeding: Secondary | ICD-10-CM | POA: Diagnosis present

## 2019-04-16 DIAGNOSIS — N939 Abnormal uterine and vaginal bleeding, unspecified: Secondary | ICD-10-CM | POA: Diagnosis present

## 2019-04-16 DIAGNOSIS — Z87891 Personal history of nicotine dependence: Secondary | ICD-10-CM

## 2019-04-16 MED ORDER — SODIUM CHLORIDE 0.9% FLUSH: 3.0000 mL | Freq: Once | INTRAVENOUS | Status: DC

## 2019-04-16 NOTE — ED Triage Notes (Signed)
Patient reports left chest pain with SOB and nausea onset this week , denies emesis or diaphoresis .

## 2019-04-17 ENCOUNTER — Encounter (HOSPITAL_COMMUNITY): Payer: Self-pay | Admitting: Internal Medicine

## 2019-04-17 ENCOUNTER — Emergency Department (HOSPITAL_COMMUNITY): Payer: Medicaid Other

## 2019-04-17 DIAGNOSIS — R0602 Shortness of breath: Secondary | ICD-10-CM | POA: Diagnosis not present

## 2019-04-17 DIAGNOSIS — R079 Chest pain, unspecified: Secondary | ICD-10-CM | POA: Diagnosis not present

## 2019-04-17 DIAGNOSIS — D649 Anemia, unspecified: Secondary | ICD-10-CM | POA: Diagnosis not present

## 2019-04-17 DIAGNOSIS — N920 Excessive and frequent menstruation with regular cycle: Secondary | ICD-10-CM | POA: Diagnosis present

## 2019-04-17 DIAGNOSIS — G510 Bell's palsy: Secondary | ICD-10-CM | POA: Diagnosis present

## 2019-04-17 DIAGNOSIS — Z880 Allergy status to penicillin: Secondary | ICD-10-CM | POA: Diagnosis not present

## 2019-04-17 DIAGNOSIS — R002 Palpitations: Secondary | ICD-10-CM | POA: Diagnosis not present

## 2019-04-17 DIAGNOSIS — D509 Iron deficiency anemia, unspecified: Secondary | ICD-10-CM | POA: Diagnosis present

## 2019-04-17 DIAGNOSIS — Z888 Allergy status to other drugs, medicaments and biological substances status: Secondary | ICD-10-CM | POA: Diagnosis not present

## 2019-04-17 DIAGNOSIS — N938 Other specified abnormal uterine and vaginal bleeding: Secondary | ICD-10-CM

## 2019-04-17 DIAGNOSIS — N939 Abnormal uterine and vaginal bleeding, unspecified: Secondary | ICD-10-CM | POA: Diagnosis present

## 2019-04-17 DIAGNOSIS — Z87891 Personal history of nicotine dependence: Secondary | ICD-10-CM | POA: Diagnosis not present

## 2019-04-17 DIAGNOSIS — Z793 Long term (current) use of hormonal contraceptives: Secondary | ICD-10-CM | POA: Diagnosis not present

## 2019-04-17 DIAGNOSIS — Z20828 Contact with and (suspected) exposure to other viral communicable diseases: Secondary | ICD-10-CM | POA: Diagnosis not present

## 2019-04-17 LAB — CBC
HCT: 20.5 % — ABNORMAL LOW (ref 36.0–46.0)
HCT: 25 % — ABNORMAL LOW (ref 36.0–46.0)
Hemoglobin: 5.7 g/dL — CL (ref 12.0–15.0)
Hemoglobin: 7.5 g/dL — ABNORMAL LOW (ref 12.0–15.0)
MCH: 21.6 pg — ABNORMAL LOW (ref 26.0–34.0)
MCH: 23.8 pg — ABNORMAL LOW (ref 26.0–34.0)
MCHC: 27.8 g/dL — ABNORMAL LOW (ref 30.0–36.0)
MCHC: 30 g/dL (ref 30.0–36.0)
MCV: 77.7 fL — ABNORMAL LOW (ref 80.0–100.0)
MCV: 79.4 fL — ABNORMAL LOW (ref 80.0–100.0)
Platelets: 235 10*3/uL (ref 150–400)
Platelets: 311 10*3/uL (ref 150–400)
RBC: 2.64 MIL/uL — ABNORMAL LOW (ref 3.87–5.11)
RBC: 3.15 MIL/uL — ABNORMAL LOW (ref 3.87–5.11)
RDW: 19.8 % — ABNORMAL HIGH (ref 11.5–15.5)
RDW: 19.8 % — ABNORMAL HIGH (ref 11.5–15.5)
WBC: 6 10*3/uL (ref 4.0–10.5)
WBC: 6.3 10*3/uL (ref 4.0–10.5)
nRBC: 0.5 % — ABNORMAL HIGH (ref 0.0–0.2)
nRBC: 2.4 % — ABNORMAL HIGH (ref 0.0–0.2)

## 2019-04-17 LAB — BASIC METABOLIC PANEL
Anion gap: 11 (ref 5–15)
BUN: 16 mg/dL (ref 6–20)
CO2: 20 mmol/L — ABNORMAL LOW (ref 22–32)
Calcium: 8.5 mg/dL — ABNORMAL LOW (ref 8.9–10.3)
Chloride: 107 mmol/L (ref 98–111)
Creatinine, Ser: 1 mg/dL (ref 0.44–1.00)
GFR calc Af Amer: >60 mL/min (ref >60)
GFR calc non Af Amer: >60 mL/min (ref >60)
Glucose, Bld: 120 mg/dL — ABNORMAL HIGH (ref 70–99)
Potassium: 3.6 mmol/L (ref 3.5–5.1)
Sodium: 138 mmol/L (ref 135–145)

## 2019-04-17 LAB — TROPONIN I (HIGH SENSITIVITY)
Troponin I (High Sensitivity): <2 ng/L (ref <18)
Troponin I (High Sensitivity): <2 ng/L (ref <18)

## 2019-04-17 LAB — SARS CORONAVIRUS 2 (TAT 6-24 HRS): SARS Coronavirus 2: NEGATIVE

## 2019-04-17 LAB — FOLATE: Folate: 14.8 ng/mL (ref >5.9)

## 2019-04-17 LAB — PREPARE RBC (CROSSMATCH)

## 2019-04-17 LAB — RETICULOCYTES
Immature Retic Fract: 4.3 % (ref 2.3–15.9)
RBC.: 3.15 MIL/uL — ABNORMAL LOW (ref 3.87–5.11)
Retic Count, Absolute: 55 10*3/uL (ref 19.0–186.0)
Retic Ct Pct: 1.8 % (ref 0.4–3.1)

## 2019-04-17 LAB — IRON AND TIBC
Iron: 244 ug/dL — ABNORMAL HIGH (ref 28–170)
Saturation Ratios: 57 % — ABNORMAL HIGH (ref 10.4–31.8)
TIBC: 428 ug/dL (ref 250–450)
UIBC: 184 ug/dL

## 2019-04-17 LAB — I-STAT BETA HCG BLOOD, ED (MC, WL, AP ONLY): I-stat hCG, quantitative: <5 m[IU]/mL (ref <5)

## 2019-04-17 LAB — PROTIME-INR
INR: 1 (ref 0.8–1.2)
Prothrombin Time: 13 seconds (ref 11.4–15.2)

## 2019-04-17 LAB — VITAMIN B12: Vitamin B-12: 457 pg/mL (ref 180–914)

## 2019-04-17 LAB — ABO/RH: ABO/RH(D): B POS

## 2019-04-17 LAB — FERRITIN: Ferritin: 3 ng/mL — ABNORMAL LOW (ref 11–307)

## 2019-04-17 MED ORDER — SODIUM CHLORIDE 0.9 % IV SOLN
510.0000 mg | Freq: Once | INTRAVENOUS | Status: AC
  Administered 2019-04-17: 510 mg via INTRAVENOUS
  Filled 2019-04-17: qty 17

## 2019-04-17 MED ORDER — ACETAMINOPHEN 325 MG PO TABS
650.0000 mg | ORAL_TABLET | Freq: Four times a day (QID) | ORAL | Status: DC | PRN
  Administered 2019-04-18: 650 mg via ORAL
  Filled 2019-04-17: qty 2

## 2019-04-17 MED ORDER — ONDANSETRON HCL 4 MG/2ML IJ SOLN
4.0000 mg | Freq: Four times a day (QID) | INTRAMUSCULAR | Status: DC | PRN
  Administered 2019-04-17 (×2): 4 mg via INTRAVENOUS
  Filled 2019-04-17 (×2): qty 2

## 2019-04-17 MED ORDER — ONDANSETRON HCL 4 MG PO TABS: 4.0000 mg | ORAL_TABLET | Freq: Four times a day (QID) | ORAL | Status: DC | PRN

## 2019-04-17 MED ORDER — ENOXAPARIN SODIUM 40 MG/0.4ML ~~LOC~~ SOLN
40.0000 mg | SUBCUTANEOUS | Status: DC
  Administered 2019-04-17: 40 mg via SUBCUTANEOUS
  Filled 2019-04-17: qty 0.4

## 2019-04-17 MED ORDER — ACETAMINOPHEN 650 MG RE SUPP: 650.0000 mg | Freq: Four times a day (QID) | RECTAL | Status: DC | PRN

## 2019-04-17 MED ORDER — SODIUM CHLORIDE 0.9 % IV SOLN
10.0000 mL/h | Freq: Once | INTRAVENOUS | Status: AC
  Administered 2019-04-17: 10 mL/h via INTRAVENOUS

## 2019-04-17 MED ORDER — MEDROXYPROGESTERONE ACETATE 10 MG PO TABS
10.0000 mg | ORAL_TABLET | Freq: Every day | ORAL | Status: DC
  Administered 2019-04-17 – 2019-04-18 (×2): 10 mg via ORAL
  Filled 2019-04-17 (×4): qty 1

## 2019-04-17 NOTE — ED Notes (Signed)
Dr. Roxanne Mins notified on patient's low HGB.

## 2019-04-17 NOTE — ED Provider Notes (Signed)
TIME SEEN: 1:37 AM  CHIEF COMPLAINT: Palpitations, shortness of breath, fatigue, lightheadedness  HPI: Patient is a 38 year old female with history of hypertension who presents to the emergency department with palpitations, shortness of breath, fatigue and lightheadedness over the past several days.  States she has had a menstrual cycle since September 14.  Saw Larene Pickett NP with women's health care on 04/01/2019 and was started on medroxyprogesterone reports bleeding has significantly improved but has not stopped.  No history of CAD, PE or DVT.  She has never had a blood transfusion before.  No abdominal pain, vaginal discharge.  ROS: See HPI Constitutional: no fever  Eyes: no drainage  ENT: no runny nose   Cardiovascular:  no chest pain; + palpitations Resp:  SOB  GI: no vomiting GU: no dysuria Integumentary: no rash  Allergy: no hives  Musculoskeletal: no leg swelling  Neurological: no slurred speech ROS otherwise negative  PAST MEDICAL HISTORY/PAST SURGICAL HISTORY:  Past Medical History:  Diagnosis Date  . Bell's palsy   . Hypertension during pregnancy    MEDICATIONS:  Prior to Admission medications   Medication Sig Start Date End Date Taking? Authorizing Provider  medroxyPROGESTERone (PROVERA) 10 MG tablet Take 1 tablet (10 mg total) by mouth daily for 21 days. 04/01/19 04/22/19  Virginia Rochester, NP    ALLERGIES:  Allergies  Allergen Reactions  . Other Itching    Reaction to unknown antibiotic at age 32  . Penicillins Itching    Has patient had a PCN reaction causing immediate rash, facial/tongue/throat swelling, SOB or lightheadedness with hypotension: No Has patient had a PCN reaction causing severe rash involving mucus membranes or skin necrosis: No Has patient had a PCN reaction that required hospitalization: No Has patient had a PCN reaction occurring within the last 10 years: No If all of the above answers are "NO", then may proceed with Cephalosporin  use.     SOCIAL HISTORY:  Social History   Tobacco Use  . Smoking status: Former Research scientist (life sciences)  . Smokeless tobacco: Never Used  Substance Use Topics  . Alcohol use: Yes    Comment: social    FAMILY HISTORY: No family history on file.  EXAM: BP (!) 152/78 (BP Location: Right Arm)   Pulse (!) 104   Temp 98.2 F (36.8 C) (Oral)   Resp 16   LMP 04/09/2019   SpO2 100%  CONSTITUTIONAL: Alert and oriented and responds appropriately to questions. Well-appearing; well-nourished HEAD: Normocephalic EYES: Conjunctivae clear, pupils appear equal, EOMI ENT: normal nose; moist mucous membranes NECK: Supple, no meningismus, no nuchal rigidity, no LAD  CARD: Regular and minimally tachycardic; S1 and S2 appreciated; no murmurs, no clicks, no rubs, no gallops RESP: Normal chest excursion without splinting or tachypnea; breath sounds clear and equal bilaterally; no wheezes, no rhonchi, no rales, no hypoxia or respiratory distress, speaking full sentences ABD/GI: Normal bowel sounds; non-distended; soft, non-tender, no rebound, no guarding, no peritoneal signs, no hepatosplenomegaly BACK:  The back appears normal and is non-tender to palpation, there is no CVA tenderness EXT: Normal ROM in all joints; non-tender to palpation; no edema; normal capillary refill; no cyanosis, no calf tenderness or swelling    SKIN: Normal color for age and race; warm; no rash NEURO: Moves all extremities equally PSYCH: The patient's mood and manner are appropriate. Grooming and personal hygiene are appropriate.  MEDICAL DECISION MAKING: Patient here with symptomatic anemia likely from her heavy menstrual cycles.  Hemoglobin is 5.7.  Vaginal bleeding has improved  on Provera.  Not on antiplatelets or anticoagulants.  Abdominal exam benign.  Troponin negative.  EKG shows no ischemic change.  Chest x-ray clear.  Will transfuse 2 units of packed red blood cells.  Will discuss with medicine for admission.  ED PROGRESS: 1:59    Discussed patient's case with hospitalist, Dr. Alcario Drought.  I have recommended admission and patient (and family if present) agree with this plan. Admitting physician will place admission orders.   I reviewed all nursing notes, vitals, pertinent previous records and interpreted all EKGs, lab and urine results, imaging (as available).     EKG Interpretation  Date/Time:  Tuesday April 16 2019 23:42:31 EST Ventricular Rate:  92 PR Interval:  154 QRS Duration: 84 QT Interval:  368 QTC Calculation: 455 R Axis:   1 Text Interpretation: Normal sinus rhythm Normal ECG When compared with ECG of 09/24/2018, No significant change was found Confirmed by Delora Fuel ([id number]) on 04/17/2019 1:17:07 AM       CRITICAL CARE Performed by: Cyril Mourning Ward   Total critical care time: 53 minutes  Critical care time was exclusive of separately billable procedures and treating other patients.  Critical care was necessary to treat or prevent imminent or life-threatening deterioration.  Critical care was time spent personally by me on the following activities: development of treatment plan with patient and/or surrogate as well as nursing, discussions with consultants, evaluation of patient's response to treatment, examination of patient, obtaining history from patient or surrogate, ordering and performing treatments and interventions, ordering and review of laboratory studies, ordering and review of radiographic studies, pulse oximetry and re-evaluation of patient's condition.    Linda Thomas was evaluated in Emergency Department on 04/17/2019 for the symptoms described in the history of present illness. She was evaluated in the context of the global COVID-19 pandemic, which necessitated consideration that the patient might be at risk for infection with the SARS-CoV-2 virus that causes COVID-19. Institutional protocols and algorithms that pertain to the evaluation of patients at risk for COVID-19  are in a state of rapid change based on information released by regulatory bodies including the CDC and federal and state organizations. These policies and algorithms were followed during the patient's care in the ED.    Ward, Delice Bison, DO 04/17/19 0210

## 2019-04-17 NOTE — ED Notes (Signed)
Breakfast tray ordered 

## 2019-04-17 NOTE — H&P (Signed)
History and Physical    Linda Thomas XBJ:[id number] DOB: [birth year] DOA: 04/16/2019  PCP: Ralene Ok, MD  Patient coming from: Home  I have personally briefly reviewed patient's old medical records in [facility]  Chief Complaint: Palpitations, SOB  HPI: Linda Thomas is a 38 y.o. female with medical history significant of HTN, bells palsy.  Patient with ongoing vaginal bleeding since sept 14th.  Saw GYN on 10/26, started on medroxyprogesterone.  Bleeding has significantly improved but not stopped completely.  Has [place] scheduled for 11/16.  Patient presents to the ED with c/o palpitations, SOB, fatigue, lightheadedness.  Onset and worsening over past couple of days.  No h/o CAD, PE, DVT.  No leg swelling, no abd pain.   ED Course: HGB 5.7.  Trop neg.  2u PRBC transfusion ordered.   Review of Systems: As per HPI, otherwise all review of systems negative.  Past Medical History:  Diagnosis Date  . Bell's palsy   . Hypertension during pregnancy    Past Surgical History:  Procedure Laterality Date  . CESAREAN SECTION    . TUBAL LIGATION       reports that she has quit smoking. She has never used smokeless tobacco. She reports current alcohol use. She reports that she does not use drugs.  Allergies  Allergen Reactions  . Other Itching    Reaction to unknown antibiotic at age 60  . Penicillins Itching    Has patient had a PCN reaction causing immediate rash, facial/tongue/throat swelling, SOB or lightheadedness with hypotension: No Has patient had a PCN reaction causing severe rash involving mucus membranes or skin necrosis: No Has patient had a PCN reaction that required hospitalization: No Has patient had a PCN reaction occurring within the last 10 years: No If all of the above answers are "NO", then may proceed with Cephalosporin use.     Family History  Problem Relation Age of Onset  . Bleeding Disorder Neg Hx      Prior to  Admission medications   Medication Sig Start Date End Date Taking? Authorizing Provider  medroxyPROGESTERone (PROVERA) 10 MG tablet Take 1 tablet (10 mg total) by mouth daily for 21 days. 04/01/19 04/22/19  Currie Paris, NP    Physical Exam: Vitals:   04/17/19 0200 04/17/19 0215 04/17/19 0230 04/17/19 0249  BP: (!) 152/89 (!) 153/94 (!) 155/95 (!) 152/100  Pulse: 90  90 86  Resp: (!) 22 17 16 17   Temp:   98.6 F (37 C) 98.5 F (36.9 C)  TempSrc:   Oral Oral  SpO2: 100%  100% 100%    Constitutional: NAD, calm, comfortable Eyes: PERRL, lids and conjunctivae normal ENMT: Mucous membranes are moist. Posterior pharynx clear of any exudate or lesions.Normal dentition.  Neck: normal, supple, no masses, no thyromegaly Respiratory: clear to auscultation bilaterally, no wheezing, no crackles. Normal respiratory effort. No accessory muscle use.  Cardiovascular: Regular rate and rhythm, no murmurs / rubs / gallops. No extremity edema. 2+ pedal pulses. No carotid bruits.  Abdomen: no tenderness, no masses palpated. No hepatosplenomegaly. Bowel sounds positive.  Musculoskeletal: no clubbing / cyanosis. No joint deformity upper and lower extremities. Good ROM, no contractures. Normal muscle tone.  Skin: no rashes, lesions, ulcers. No induration Neurologic: CN 2-12 grossly intact. Sensation intact, DTR normal. Strength 5/5 in all 4.  Psychiatric: Normal judgment and insight. Alert and oriented x 3. Normal mood.    Labs on Admission: I have personally reviewed following labs and imaging studies  CBC: Recent Labs  Lab 04/16/19 2351  WBC 6.3  HGB 5.7*  HCT 20.5*  MCV 77.7*  PLT 311   Basic Metabolic Panel: Recent Labs  Lab 04/16/19 2351  NA 138  K 3.6  CL 107  CO2 20*  GLUCOSE 120*  BUN 16  CREATININE 1.00  CALCIUM 8.5*   GFR: CrCl cannot be calculated (Unknown ideal weight.). Liver Function Tests: No results for input(s): AST, ALT, ALKPHOS, BILITOT, PROT, ALBUMIN in the  last 168 hours. No results for input(s): LIPASE, AMYLASE in the last 168 hours. No results for input(s): AMMONIA in the last 168 hours. Coagulation Profile: Recent Labs  Lab 04/16/19 2351  INR 1.0   Cardiac Enzymes: No results for input(s): CKTOTAL, CKMB, CKMBINDEX, TROPONINI in the last 168 hours. BNP (last 3 results) No results for input(s): PROBNP in the last 8760 hours. HbA1C: No results for input(s): HGBA1C in the last 72 hours. CBG: No results for input(s): GLUCAP in the last 168 hours. Lipid Profile: No results for input(s): CHOL, HDL, LDLCALC, TRIG, CHOLHDL, LDLDIRECT in the last 72 hours. Thyroid Function Tests: No results for input(s): TSH, T4TOTAL, FREET4, T3FREE, THYROIDAB in the last 72 hours. Anemia Panel: No results for input(s): VITAMINB12, FOLATE, FERRITIN, TIBC, IRON, RETICCTPCT in the last 72 hours. Urine analysis:    Component Value Date/Time   COLORURINE YELLOW 02/20/2017 1414   APPEARANCEUR CLEAR 02/20/2017 1414   LABSPEC 1.024 02/20/2017 1414   PHURINE 5.0 02/20/2017 1414   GLUCOSEU NEGATIVE 02/20/2017 1414   HGBUR MODERATE (A) 02/20/2017 1414   BILIRUBINUR NEGATIVE 02/20/2017 1414   KETONESUR NEGATIVE 02/20/2017 1414   PROTEINUR NEGATIVE 02/20/2017 1414   NITRITE NEGATIVE 02/20/2017 1414   LEUKOCYTESUR NEGATIVE 02/20/2017 1414    Radiological Exams on Admission: Dg Chest 2 View  Result Date: 04/17/2019 CLINICAL DATA:  Chest pain and shortness of breath for 1 week EXAM: CHEST - 2 VIEW COMPARISON:  09/24/2018 FINDINGS: The heart size and mediastinal contours are within normal limits. Both lungs are clear. The visualized skeletal structures are unremarkable. IMPRESSION: No active cardiopulmonary disease. Electronically Signed   By: Alcide Clever M.D.   On: 04/17/2019 00:11    EKG: Independently reviewed.  Assessment/Plan Principal Problem:   Symptomatic anemia Active Problems:   Dysfunctional uterine bleeding    1. Symptomatic anemia - 1. 2u  PRBC transfusion 2. Repeat H/H post transfusion 2. Dysfunctional uterine bleeding - 1. [place] scheduled for 11/16 2. Follow up GYN scheduled for 11/18 3. Continue medroxyprogesterone for now  DVT prophylaxis: SCDs Code Status: Full Family Communication: Husband at bedside Disposition Plan: Home after admit Consults called: None Admission status: Place in 11    Chloe Flis M. DO [facility]  How to contact the [facility] Attending or Consulting provider 7A - 7P or covering provider during after hours 7P -7A, for this patient?  1. Check the care team in [facility] and look for a) attending/consulting [facility] provider listed and b) the [facility] team listed 2. Log into [email or web address]  Amion Physician Scheduling and messaging for groups and whole hospitals  On call and physician scheduling software for group practices, residents, hospitalists and other medical providers for call, clinic, rotation and shift schedules. OnCall Enterprise is a hospital-wide system for scheduling doctors and paging doctors on call. EasyPlot is for scientific plotting and data analysis.  [email or web address]  and use [place]'s universal password to access. If you do not have the password, please contact the hospital operator.  3. Locate the [facility]  provider you are looking for under [facility] and page to a number that you can be directly reached. 4. If you still have difficulty reaching the provider, please page the [facility] (Director on Call) for the Hospitalists listed on amion for assistance.  04/17/2019, 3:06 AM

## 2019-04-17 NOTE — Progress Notes (Signed)
Patient seen and examined, admitted earlier this morning by Dr. Alcario Drought, this is a 38 year old female with history of hypertension Bell's palsy, ongoing history of heavy vaginal bleeding from 9/14, saw GYN on 10/26 and started on Megace for this, bleeding has significantly improved but not stopped completely. -She presented to the emergency room overnight with fatigue lightheadedness shortness of breath and palpitations started few days ago but got significantly worse last night, work-up in the ED noted a hemoglobin of 5.7, high-sensitivity troponins were negative  Severe symptomatic iron deficiency anemia -Secondary to menorrhagia, transfusing 2 units of PRBC today -Check anemia panel -Give IV iron pending anemia panel -Continue Megace, bleeding appears to be stopping per patient report, patient has an upcoming appointment with GYN on 11/16 for an ultrasound, history of uterine fibroids in the family, may need hysterectomy -Ambulate after transfusion and check hb -Monitor on telemetry -If improved symptomatically will discharge home later today otherwise may need another unit of PRBC  Domenic Polite, MD

## 2019-04-17 NOTE — ED Notes (Signed)
Breakfast at bedside.

## 2019-04-18 LAB — TYPE AND SCREEN
ABO/RH(D): B POS
Antibody Screen: NEGATIVE
Unit division: 0
Unit division: 0

## 2019-04-18 LAB — CBC
HCT: 28.5 % — ABNORMAL LOW (ref 36.0–46.0)
Hemoglobin: 8.5 g/dL — ABNORMAL LOW (ref 12.0–15.0)
MCH: 23.7 pg — ABNORMAL LOW (ref 26.0–34.0)
MCHC: 29.8 g/dL — ABNORMAL LOW (ref 30.0–36.0)
MCV: 79.4 fL — ABNORMAL LOW (ref 80.0–100.0)
Platelets: 260 10*3/uL (ref 150–400)
RBC: 3.59 MIL/uL — ABNORMAL LOW (ref 3.87–5.11)
RDW: 20.1 % — ABNORMAL HIGH (ref 11.5–15.5)
WBC: 5.1 10*3/uL (ref 4.0–10.5)
nRBC: 1.6 % — ABNORMAL HIGH (ref 0.0–0.2)

## 2019-04-18 LAB — BASIC METABOLIC PANEL
Anion gap: 9 (ref 5–15)
BUN: 10 mg/dL (ref 6–20)
CO2: 22 mmol/L (ref 22–32)
Calcium: 8.6 mg/dL — ABNORMAL LOW (ref 8.9–10.3)
Chloride: 106 mmol/L (ref 98–111)
Creatinine, Ser: 0.75 mg/dL (ref 0.44–1.00)
GFR calc Af Amer: >60 mL/min (ref >60)
GFR calc non Af Amer: >60 mL/min (ref >60)
Glucose, Bld: 114 mg/dL — ABNORMAL HIGH (ref 70–99)
Potassium: 3.9 mmol/L (ref 3.5–5.1)
Sodium: 137 mmol/L (ref 135–145)

## 2019-04-18 LAB — BPAM RBC
Blood Product Expiration Date: 202011202359
Blood Product Expiration Date: 202011202359
ISSUE DATE / TIME: 202011110223
ISSUE DATE / TIME: 202011110537
Unit Type and Rh: 1700
Unit Type and Rh: 1700

## 2019-04-18 MED ORDER — FERROUS SULFATE 325 (65 FE) MG PO TBEC: 325.0000 mg | DELAYED_RELEASE_TABLET | Freq: Two times a day (BID) | ORAL | 1 refills | Status: DC

## 2019-04-18 NOTE — Progress Notes (Signed)
RN gave pt discharge instructions and she stated understanding. 1 prescription given to the patient, iv has been removed and belongings packed. Pt stated that she wanted to walk out and that she drove that she would be okay.

## 2019-04-18 NOTE — Discharge Summary (Signed)
Physician Discharge Summary  Linda Thomas [id number] DOB: 04/04/1981 DOA: 04/16/2019  PCP: Jilda Panda, MD  Admit date: 04/16/2019 Discharge date: 04/18/2019  Time spent: 35 minutes  Recommendations for Outpatient Follow-up:  [facility] clinic on 11/16 for pelvic ultrasound and follow-up  Discharge Diagnoses:  Principal Problem:   Symptomatic anemia   Severe iron deficiency anemia   Menorrhagia   Dysfunctional uterine bleeding   Discharge Condition: Stable  Diet recommendation: Regular  There were no vitals filed for this visit.  History of present illness:  38 year old female with history of hypertension Bell's palsy, ongoing history of heavy vaginal bleeding from 9/14, saw GYN on 10/26 and started on Megace for this, bleeding has significantly improved but not stopped completely. -She presented to the emergency room overnight with fatigue lightheadedness shortness of breath and palpitations started few days ago but got significantly worse last night, work-up in the ED noted a hemoglobin of 5.7  Hospital Course:   Severe symptomatic iron deficiency anemia -Secondary to menorrhagia, transfused 2 units of PRBC today -Anemia panel with severe iron deficiency, given IV iron -She was started on Megace little over 2 weeks ago by GYN as outpatient, will complete 21 days of this as prescribed in 3 days, advised to stop this on Sunday after course is completed, her bleeding has almost subsided now -Patient has an upcoming appointment with gynecology if on 11/16 for an ultrasound, history of uterine fibroids, may need hysterectomy down the road -Hemoglobin improved to 8.5 and symptomatically doing much better ambulating without symptoms, discharged home in a stable condition on oral iron therapy with meals with recommendation for close GYN follow-up  Discharge Exam: Vitals:   04/18/19 0049 04/18/19 0603  BP: 125/88 129/88  Pulse: 77 79  Resp: 17 18  Temp:  98.4 F (36.9 C) 98.3 F (36.8 C)  SpO2: 98% 97%    General: AAOx3 Cardiovascular: S1S2/RRR Respiratory: CTAB  Discharge Instructions   Discharge Instructions    Increase activity slowly   Complete by: As directed      Allergies as of 04/18/2019      Reactions   Other Itching   Reaction to unknown antibiotic at age 12   Penicillins Itching   Has patient had a PCN reaction causing immediate rash, facial/tongue/throat swelling, SOB or lightheadedness with hypotension: No Has patient had a PCN reaction causing severe rash involving mucus membranes or skin necrosis: No Has patient had a PCN reaction that required hospitalization: No Has patient had a PCN reaction occurring within the last 10 years: No If all of the above answers are "NO", then may proceed with Cephalosporin use.      Medication List    TAKE these medications   ferrous sulfate 325 (65 FE) MG EC tablet Take 1 tablet (325 mg total) by mouth 2 (two) times daily after a meal.   medroxyPROGESTERone 10 MG tablet Commonly known as: PROVERA Take 1 tablet (10 mg total) by mouth daily for 21 days.      Allergies  Allergen Reactions  . Other Itching    Reaction to unknown antibiotic at age 40  . Penicillins Itching    Has patient had a PCN reaction causing immediate rash, facial/tongue/throat swelling, SOB or lightheadedness with hypotension: No Has patient had a PCN reaction causing severe rash involving mucus membranes or skin necrosis: No Has patient had a PCN reaction that required hospitalization: No Has patient had a PCN reaction occurring within the last 10 years: No If all  of the above answers are "NO", then may proceed with Cephalosporin use.    Follow-up Information    gynecologist Follow up in 1 week(s).            The results of significant diagnostics from this hospitalization (including imaging, microbiology, ancillary and laboratory) are listed below for reference.    Significant  Diagnostic Studies: Dg Chest 2 View  Result Date: 04/17/2019 CLINICAL DATA:  Chest pain and shortness of breath for 1 week EXAM: CHEST - 2 VIEW COMPARISON:  09/24/2018 FINDINGS: The heart size and mediastinal contours are within normal limits. Both lungs are clear. The visualized skeletal structures are unremarkable. IMPRESSION: No active cardiopulmonary disease. Electronically Signed   By: Inez Catalina M.D.   On: 04/17/2019 00:11    Microbiology: Recent Results (from the past 240 hour(s))  SARS CORONAVIRUS 2 (TAT 6-24 HRS) Nasopharyngeal Nasopharyngeal Swab     Status: None   Collection Time: 04/17/19  2:14 AM   Specimen: Nasopharyngeal Swab  Result Value Ref Range Status   SARS Coronavirus 2 NEGATIVE NEGATIVE Final    Comment: (NOTE) SARS-CoV-2 target nucleic acids are NOT DETECTED. The SARS-CoV-2 RNA is generally detectable in upper and lower respiratory specimens during the acute phase of infection. Negative results do not preclude SARS-CoV-2 infection, do not rule out co-infections with other pathogens, and should not be used as the sole basis for treatment or other patient management decisions. Negative results must be combined with clinical observations, patient history, and epidemiological information. The expected result is Negative. Fact Sheet for Patients: SugarRoll.be Fact Sheet for Healthcare Providers: https://www.woods-mathews.com/ This test is not yet approved or cleared by the [place] FDA and  has been authorized for detection and/or diagnosis of SARS-CoV-2 by FDA under an Emergency Use Authorization (EUA). This EUA will remain  in effect (meaning this test can be used) for the duration of the COVID-19 declaration under Section 56 4(b)(1) of the Act, 21 U.S.C. section 360bbb-3(b)(1), unless the authorization is terminated or revoked sooner. Performed at [facility], [place] [address], [place], [place] [address]       Labs: Basic Metabolic Panel: Recent Labs  Lab 04/16/19 2351 04/18/19 0702  NA 138 137  K 3.6 3.9  CL 107 106  CO2 20* 22  GLUCOSE 120* 114*  BUN 16 10  CREATININE 1.00 0.75  CALCIUM 8.5* 8.6*   Liver Function Tests: No results for input(s): AST, ALT, ALKPHOS, BILITOT, PROT, ALBUMIN in the last 168 hours. No results for input(s): LIPASE, AMYLASE in the last 168 hours. No results for input(s): AMMONIA in the last 168 hours. CBC: Recent Labs  Lab 04/16/19 2351 04/17/19 1119 04/18/19 0702  WBC 6.3 6.0 5.1  HGB 5.7* 7.5* 8.5*  HCT 20.5* 25.0* 28.5*  MCV 77.7* 79.4* 79.4*  PLT 311 235 260   Cardiac Enzymes: No results for input(s): CKTOTAL, CKMB, CKMBINDEX, TROPONINI in the last 168 hours. BNP: BNP (last 3 results) No results for input(s): BNP in the last 8760 hours.  ProBNP (last 3 results) No results for input(s): PROBNP in the last 8760 hours.  CBG: No results for input(s): GLUCAP in the last 168 hours.     Signed:  Domenic Polite MD.  [facility] 04/18/2019, 3:21 PM

## 2019-04-22 ENCOUNTER — Other Ambulatory Visit: Payer: Self-pay

## 2019-04-22 ENCOUNTER — Ambulatory Visit (HOSPITAL_COMMUNITY)
Admission: RE | Admit: 2019-04-22 | Discharge: 2019-04-22 | Disposition: A | Discharge status: Home / Self Care | Payer: Medicaid Other | Source: Ambulatory Visit | Attending: Nurse Practitioner | Admitting: Nurse Practitioner

## 2019-04-22 DIAGNOSIS — N939 Abnormal uterine and vaginal bleeding, unspecified: Secondary | ICD-10-CM | POA: Insufficient documentation

## 2019-04-24 ENCOUNTER — Ambulatory Visit (INDEPENDENT_AMBULATORY_CARE_PROVIDER_SITE_OTHER): Payer: Self-pay | Admitting: Obstetrics and Gynecology

## 2019-04-24 ENCOUNTER — Other Ambulatory Visit: Payer: Self-pay

## 2019-04-24 ENCOUNTER — Encounter: Payer: Self-pay | Admitting: Obstetrics and Gynecology

## 2019-04-24 VITALS — BP 169/95 | HR 91 | Wt 231.9 lb

## 2019-04-24 DIAGNOSIS — N939 Abnormal uterine and vaginal bleeding, unspecified: Secondary | ICD-10-CM

## 2019-04-24 DIAGNOSIS — D649 Anemia, unspecified: Secondary | ICD-10-CM

## 2019-04-24 DIAGNOSIS — I10 Essential (primary) hypertension: Secondary | ICD-10-CM

## 2019-04-24 MED ORDER — MEGESTROL ACETATE 40 MG PO TABS: 40.0000 mg | ORAL_TABLET | Freq: Two times a day (BID) | ORAL | 2 refills | Status: DC

## 2019-04-24 MED ORDER — LISINOPRIL 5 MG PO TABS: 5.0000 mg | ORAL_TABLET | Freq: Every day | ORAL | 3 refills | Status: DC

## 2019-04-24 NOTE — Progress Notes (Signed)
Here for a follow up Went for U/S on Monday Last night had a gush of blood statesit came [place] nowhere 1st time this has happened.

## 2019-04-24 NOTE — Progress Notes (Signed)
GYNECOLOGY OFFICE FOLLOW UP NOTE  History:  38 y.o. No obstetric history on file. here today for follow up for heavy bleeding.  Has had heavy bleeding since 02/18/2019. Had regular periods up until the spring, then did not have a period for several months, then started bleeding in September and it did not really stop after that. Started on provera with improvement. Seen in ED for symptomatic anemia and given blood transfusion, discharged 04/22/2019. Bleeding was getting better, then had a big gush and a clot last night. Denies any light-headedness, dizziness, difficulty breathing.  Past Medical History:  Diagnosis Date   Bell's palsy    Hypertension during pregnancy   Past Surgical History:  Procedure Laterality Date   CESAREAN SECTION     TUBAL LIGATION      Current Outpatient Medications:    ferrous sulfate 325 (65 FE) MG EC tablet, Take 1 tablet (325 mg total) by mouth 2 (two) times daily after a meal., Disp: 60 tablet, Rfl: 1   lisinopril (ZESTRIL) 5 MG tablet, Take 1 tablet (5 mg total) by mouth daily., Disp: 30 tablet, Rfl: 3   medroxyPROGESTERone (PROVERA) 10 MG tablet, Take 1 tablet (10 mg total) by mouth daily for 21 days., Disp: 21 tablet, Rfl: 0   megestrol (MEGACE) 40 MG tablet, Take 1 tablet (40 mg total) by mouth 2 (two) times daily. Can increase to two tablets twice a day in the event of heavy bleeding, Disp: 60 tablet, Rfl: 2  The following portions of the patient's history were reviewed and updated as appropriate: allergies, current medications, past family history, past medical history, past social history, past surgical history and problem list.   Review of Systems:  Pertinent items noted in HPI and remainder of comprehensive ROS otherwise negative.   Objective:  Physical Exam BP (!) 169/95    Pulse 91    Wt 231 lb 14.4 oz (105.2 kg)    LMP 04/09/2019    BMI 41.08 kg/m  CONSTITUTIONAL: Well-developed, well-nourished female in no acute distress.  HENT:   Normocephalic, atraumatic. External right and left ear normal. Oropharynx is clear and moist EYES: Conjunctivae and EOM are normal. Pupils are equal, round, and reactive to light. No scleral icterus.  NECK: Normal range of motion, supple, no masses SKIN: Skin is warm and dry. No rash noted. Not diaphoretic. No erythema. No pallor. NEUROLOGIC: Alert and oriented to person, place, and time. Normal reflexes, muscle tone coordination. No cranial nerve deficit noted. PSYCHIATRIC: Normal mood and affect. Normal behavior. Normal judgment and thought content. CARDIOVASCULAR: Normal heart rate noted RESPIRATORY: Effort normal, no problems with respiration noted ABDOMEN: Soft, no distention noted.   PELVIC: deferred MUSCULOSKELETAL: Normal range of motion. No edema noted.  Labs and Imaging Dg Chest 2 View  Result Date: 04/17/2019 CLINICAL DATA:  Chest pain and shortness of breath for 1 week EXAM: CHEST - 2 VIEW COMPARISON:  09/24/2018 FINDINGS: The heart size and mediastinal contours are within normal limits. Both lungs are clear. The visualized skeletal structures are unremarkable. IMPRESSION: No active cardiopulmonary disease. Electronically Signed   By: Inez Catalina M.D.   On: 04/17/2019 00:11   US Pelvic Complete With Transvaginal  Result Date: 04/22/2019 CLINICAL DATA:  Abnormal vaginal bleeding, symptomatic anemia, transfusion 04/17/2019 EXAM: TRANSABDOMINAL AND TRANSVAGINAL ULTRASOUND OF PELVIS TECHNIQUE: Both transabdominal and transvaginal ultrasound examinations of the pelvis were performed. Transabdominal technique was performed for global imaging of the pelvis including uterus, ovaries, adnexal regions, and pelvic cul-de-sac. It was  necessary to proceed with endovaginal exam following the transabdominal exam to visualize the endometrium and LEFT ovary. COMPARISON:  None FINDINGS: Uterus Measurements: 13.5 x 7.1 x 7.5 cm = volume: 374 mL. Retroflexed. Large anterior wall leiomyoma at upper  uterus 3.9 x 3.8 x 5.5 cm, transmural. Endometrium Thickness: Significant portion obscured by the large transmural/submucosal leiomyoma at upper uterus. Visualized portion measures up to 8 mm thick and is displaced posteriorly. No endometrial fluid Right ovary Measurements: 3.3 x 2.0 x 2.5 cm = volume: 8.8 mL. Normal morphology without mass Left ovary Measurements: 3.9 x 2.2 x 2.9 cm = volume: 10.2 mL. Normal morphology without mass Other findings Trace free pelvic fluid.  No adnexal masses. IMPRESSION: Large transmural leiomyoma at the upper uterine segment measuring 5.5 cm greatest size, extending submucosal and displacing the endometrial complex posteriorly. Otherwise normal exam Electronically Signed   By: Lavonia Dana M.D.   On: 04/22/2019 12:17    Assessment & Plan:   1. Abnormal uterine bleeding (AUB) - Reviewed options for management including PO hormonal management, IUD - Recommended EMB, but still having heavy bleeding, will start megace and ensure she is not bleeding prior to EMB - Return 3-4 weeks for EMB, then will discuss further options for management  2. Hypertension, unspecified type Has appt with PCP coming up Has been on HCTZ in past Will start low dose lisinopril  3. Symptomatic anemia S/p transfusion in ED Asymptomatic today  Routine preventative health maintenance measures emphasized. Please refer to After Visit Summary for other counseling recommendations.   Return in about 4 weeks (around 05/22/2019).  Total face-to-face time with patient: 20 minutes. Over 50% of encounter was spent on counseling and coordination of care.  Feliz Beam, M.D. Attending Center for [employer] [facility])

## 2019-05-27 ENCOUNTER — Ambulatory Visit (INDEPENDENT_AMBULATORY_CARE_PROVIDER_SITE_OTHER): Payer: Medicaid Other | Admitting: Obstetrics and Gynecology

## 2019-05-27 ENCOUNTER — Other Ambulatory Visit: Payer: Self-pay

## 2019-05-27 ENCOUNTER — Encounter: Payer: Self-pay | Admitting: Obstetrics and Gynecology

## 2019-05-27 VITALS — BP 158/100 | HR 80 | Ht 63.0 in | Wt 226.9 lb

## 2019-05-27 DIAGNOSIS — N939 Abnormal uterine and vaginal bleeding, unspecified: Secondary | ICD-10-CM | POA: Diagnosis not present

## 2019-05-27 LAB — POCT PREGNANCY, URINE: Preg Test, Ur: NEGATIVE

## 2019-05-27 NOTE — Progress Notes (Signed)
GYNECOLOGY OFFICE FOLLOW UP NOTE  History:  38 y.o. G2P2000 here today for follow up for AUB and here for EMB. Was started on Megace last visit with good improvement in bleeding. Her bleeding stopped a few days after she started the Megace. Briefly reviewed options for management including hormonal management, she would like definitive management for her bleeding in the form of hysterectomy.   Past Medical History:  Diagnosis Date  . Bell's palsy   . Hypertension during pregnancy    Past Surgical History:  Procedure Laterality Date  . CESAREAN SECTION    . TUBAL LIGATION       Current Outpatient Medications:  .  ferrous sulfate 325 (65 FE) MG EC tablet, Take 1 tablet (325 mg total) by mouth 2 (two) times daily after a meal., Disp: 60 tablet, Rfl: 1 .  lisinopril (ZESTRIL) 5 MG tablet, Take 1 tablet (5 mg total) by mouth daily., Disp: 30 tablet, Rfl: 3 .  megestrol (MEGACE) 40 MG tablet, Take 1 tablet (40 mg total) by mouth 2 (two) times daily. Can increase to two tablets twice a day in the event of heavy bleeding, Disp: 60 tablet, Rfl: 2 .  medroxyPROGESTERone (PROVERA) 10 MG tablet, Take 1 tablet (10 mg total) by mouth daily for 21 days., Disp: 21 tablet, Rfl: 0  The following portions of the patient's history were reviewed and updated as appropriate: allergies, current medications, past family history, past medical history, past social history, past surgical history and problem list.   Review of Systems:  Pertinent items noted in HPI and remainder of comprehensive ROS otherwise negative.   Objective:  Physical Exam BP (!) 158/100 (BP Location: Right Arm)   Pulse 80   Ht 5\' 3"  (1.6 m)   Wt 226 lb 14.4 oz (102.9 kg)   BMI 40.19 kg/m  CONSTITUTIONAL: Well-developed, well-nourished female in no acute distress.  HENT:  Normocephalic, atraumatic. External right and left ear normal. Oropharynx is clear and moist EYES: Conjunctivae and EOM are normal. Pupils are equal, round, and  reactive to light. No scleral icterus.  NECK: Normal range of motion, supple, no masses SKIN: Skin is warm and dry. No rash noted. Not diaphoretic. No erythema. No pallor. NEUROLOGIC: Alert and oriented to person, place, and time. Normal reflexes, muscle tone coordination. No cranial nerve deficit noted. PSYCHIATRIC: Normal mood and affect. Normal behavior. Normal judgment and thought content. CARDIOVASCULAR: Normal heart rate noted RESPIRATORY: Effort normal, no problems with respiration noted ABDOMEN: Soft, no distention noted.   PELVIC: Normal appearing external genitalia; normal appearing vaginal mucosa and cervix.  No abnormal discharge noted.  Normal uterine size, no other palpable masses, no uterine or adnexal tenderness. MUSCULOSKELETAL: Normal range of motion. No edema noted.  Exam done with chaperone present.  Labs and Imaging No results found.  Assessment & Plan:   1. Abnormal uterine bleeding (AUB) - See attached procedure note for EMB details - failed EMB today (pipelle would only go to 3-4 cm), discussed attempting again with cytotec, patient agreeable - return for EMB and discussion of surgical management with partner due to upcoming leave, pt verbalizes understanding and is agreeable - Surgical pathology( [facility]) - cont Megace in the meantime  Routine preventative health maintenance measures emphasized. Please refer to After Visit Summary for other counseling recommendations.   Return in about 4 weeks (around 06/24/2019) for Followup, EMB.  Total face-to-face time with patient: 20 minutes. Over 50% of encounter was spent on counseling and coordination  of care.  Feliz Beam, M.D. Attending Center for [employer] [facility])

## 2019-05-27 NOTE — Progress Notes (Signed)
ENDOMETRIAL BIOPSY      Linda Thomas is a 38 y.o. G2P2000 here for endometrial biopsy.  The indications for endometrial biopsy were reviewed.  Risks of the biopsy including cramping, bleeding, infection, uterine perforation, inadequate specimen and need for additional procedures were discussed. The patient states she understands and agrees to undergo procedure today. Consent was signed. Time out was performed.   Indications: AUB Urine HCG: negative  A bivalve speculum was placed into the vagina and the cervix was easily visualized and was prepped with Betadine x2. A single-toothed tenaculum was placed on the anterior lip of the cervix to stabilize it. The 3 mm pipelle was introduced into the endometrial cavity without difficulty to a depth of 3 cm, unable to pass further. The procedure was halted at this point. The instruments were removed from the patient's vagina. Minimal bleeding from the cervix at the tenaculum was noted.   The patient tolerated the procedure well. Routine post-procedure instructions were given to the patient.    Will have patient return for EMB with cytotec.   Feliz Beam, M.D. Attending [facility] for [employer] [facility])

## 2019-05-27 NOTE — Progress Notes (Signed)
Pt states that she started bleeding 3 days ago again, light but when she wipes its heavy

## 2019-05-28 ENCOUNTER — Encounter: Payer: Self-pay | Admitting: Obstetrics and Gynecology

## 2019-05-29 DIAGNOSIS — H47332 Pseudopapilledema of optic disc, left eye: Secondary | ICD-10-CM | POA: Diagnosis not present

## 2019-06-10 ENCOUNTER — Other Ambulatory Visit: Payer: Self-pay

## 2019-06-10 ENCOUNTER — Ambulatory Visit
Admission: RE | Admit: 2019-06-10 | Discharge: 2019-06-10 | Disposition: A | Discharge status: Home / Self Care | Payer: Medicaid Other | Source: Ambulatory Visit | Attending: Nurse Practitioner | Admitting: Nurse Practitioner

## 2019-06-10 DIAGNOSIS — Z1231 Encounter for screening mammogram for malignant neoplasm of breast: Secondary | ICD-10-CM

## 2019-06-25 ENCOUNTER — Ambulatory Visit: Payer: Medicaid Other | Admitting: Obstetrics & Gynecology

## 2019-07-02 ENCOUNTER — Encounter: Payer: Self-pay | Admitting: General Practice

## 2019-07-09 ENCOUNTER — Ambulatory Visit: Payer: Medicaid Other | Admitting: Neurology

## 2019-07-09 ENCOUNTER — Other Ambulatory Visit: Payer: Self-pay

## 2019-07-09 ENCOUNTER — Encounter: Payer: Self-pay | Admitting: Neurology

## 2019-07-09 ENCOUNTER — Telehealth: Payer: Self-pay | Admitting: Neurology

## 2019-07-09 VITALS — BP 159/95 | HR 84 | Temp 97.2°F | Ht 63.0 in | Wt 225.0 lb

## 2019-07-09 DIAGNOSIS — R519 Headache, unspecified: Secondary | ICD-10-CM | POA: Diagnosis not present

## 2019-07-09 DIAGNOSIS — H5713 Ocular pain, bilateral: Secondary | ICD-10-CM

## 2019-07-09 DIAGNOSIS — H579 Unspecified disorder of eye and adnexa: Secondary | ICD-10-CM

## 2019-07-09 DIAGNOSIS — H539 Unspecified visual disturbance: Secondary | ICD-10-CM | POA: Diagnosis not present

## 2019-07-09 DIAGNOSIS — H4710 Unspecified papilledema: Secondary | ICD-10-CM | POA: Diagnosis not present

## 2019-07-09 DIAGNOSIS — R5382 Chronic fatigue, unspecified: Secondary | ICD-10-CM

## 2019-07-09 DIAGNOSIS — R51 Headache with orthostatic component, not elsewhere classified: Secondary | ICD-10-CM | POA: Diagnosis not present

## 2019-07-09 DIAGNOSIS — G441 Vascular headache, not elsewhere classified: Secondary | ICD-10-CM

## 2019-07-09 NOTE — Progress Notes (Signed)
[facility]    Provider:  Dr Jaynee Eagles Requesting Provider: Dr. Thurston Hole. Primary Care Provider:  Jilda Panda, MD  CC:  Optic nerve edema  HPI:  Linda Thomas is a 39 y.o. female here as requested by Dr. Thurston Hole. PMHx migraine, HTN. She saw Dr. Katy Fitch for blurry vision and headaches for a few years. She has not had a headache for months. When she does have headaches it's when awakening from sleep, she snores a lot, she is very tired during the day, she wakes up with dry mouth, frequent awakenings, she has wondered in the past if she has it. If she lays down she is asleep and it doesn't take a lot. She has blurry vision at time, she thought she needed glasses but her vision was fine and Dr. Katy Fitch suspected possible papilledema vs pseudopapilledema. She had bells palsy n the past 2 years ao, she does feel she has pressure in the head, she has not had a migraines in a very long time and these headache are more like presure aorund her head, swishing in her ears, no weakness, no numbness or tingling, no double vision. She has not gained weight. No other focal neurologic deficits, associated symptoms, inciting events or modifiable factors.  Reviewed notes, labs and imaging from outside physicians, which showed:  MRI 09/2018: showed No acute intracranial abnormalities including mass lesion or mass effect, hydrocephalus, extra-axial fluid collection, midline shift, hemorrhage, or acute infarction, large ischemic events (personally reviewed images)  preg urine neg 05/27/2019, BMP BUN 10 and creay 0.75, B12 457,    Review of Systems: Patient complains of symptoms per HPI as well as the following symptoms: snoring, fatigue. Pertinent negatives and positives per HPI. All others negative.   Social History   Socioeconomic History  . Marital status: Single    Spouse name: Not on file  . Number of children: 2  . Years of education: Not on file  . Highest  education level: 11th grade  Occupational History  . Not on file  Tobacco Use  . Smoking status: Former Smoker    Quit date: 2014    Years since quitting: 7.0  . Smokeless tobacco: Never Used  Substance and Sexual Activity  . Alcohol use: Yes    Comment: social  . Drug use: No  . Sexual activity: Yes    Birth control/protection: None, Surgical  Other Topics Concern  . Not on file  Social History Narrative   Lives at home with her children & her niece   Right handed   Caffeine: occasional    Social Determinants of Health   Financial Resource Strain:   . Difficulty of Paying Living Expenses: Not on file  Food Insecurity:   . Worried About Charity fundraiser in the Last Year: Not on file  . Ran Out of Food in the Last Year: Not on file  Transportation Needs:   . Lack of Transportation (Medical): Not on file  . Lack of Transportation (Non-Medical): Not on file  Physical Activity:   . Days of Exercise per Week: Not on file  . Minutes of Exercise per Session: Not on file  Stress:   . Feeling of Stress : Not on file  Social Connections:   . Frequency of Communication with Friends and Family: Not on file  . Frequency of Social Gatherings with Friends and Family: Not on file  . Attends Religious Services: Not on file  . Active Member of Clubs or Organizations:  Not on file  . Attends Archivist Meetings: Not on file  . Marital Status: Not on file  Intimate Partner Violence:   . Fear of Current or Ex-Partner: Not on file  . Emotionally Abused: Not on file  . Physically Abused: Not on file  . Sexually Abused: Not on file    Family History  Problem Relation Age of Onset  . Lung cancer Father   . Bleeding Disorder Neg Hx   . Pseudotumor cerebri Neg Hx     Past Medical History:  Diagnosis Date  . Bell's palsy   . Hypertension during pregnancy  . Migraine     Patient Active Problem List   Diagnosis Date Noted  . Dysfunctional uterine bleeding 04/17/2019    . Symptomatic anemia 04/17/2019  . BMI 40.0-44.9, adult ([place]) 04/01/2019  . Bell's palsy 06/21/2016  . History of hypertension 06/21/2016    Past Surgical History:  Procedure Laterality Date  . CESAREAN SECTION     x2  . TUBAL LIGATION      Current Outpatient Medications  Medication Sig Dispense Refill  . ferrous sulfate 325 (65 FE) MG EC tablet Take 1 tablet (325 mg total) by mouth 2 (two) times daily after a meal. 60 tablet 1  . lisinopril (ZESTRIL) 5 MG tablet Take 1 tablet (5 mg total) by mouth daily. 30 tablet 3  . megestrol (MEGACE) 40 MG tablet Take 1 tablet (40 mg total) by mouth 2 (two) times daily. Can increase to two tablets twice a day in the event of heavy bleeding 60 tablet 2  . medroxyPROGESTERone (PROVERA) 10 MG tablet Take 1 tablet (10 mg total) by mouth daily for 21 days. 21 tablet 0   No current facility-administered medications for this visit.    Allergies as of 07/09/2019 - Review Complete 07/09/2019  Allergen Reaction Noted  . Other Itching 07/12/2014  . Penicillins Itching 08/29/2016    Vitals: BP (!) 159/95 (BP Location: Left Arm, Patient Position: Sitting)   Pulse 84   Temp (!) 97.2 F (36.2 C) Comment: taken at front  Ht 5\' 3"  (1.6 m)   Wt 225 lb (102.1 kg)   BMI 39.86 kg/m  Last Weight:  Wt Readings from Last 1 Encounters:  07/09/19 225 lb (102.1 kg)   Last Height:   Ht Readings from Last 1 Encounters:  07/09/19 5\' 3"  (1.6 m)     Physical exam: Exam: Gen: NAD, conversant, well nourised, obese, well groomed                     CV: RRR, no MRG. No Carotid Bruits. No peripheral edema, warm, nontender Eyes: Conjunctivae clear without exudates or hemorrhage  Neuro: Detailed Neurologic Exam  Speech:    Speech is normal; fluent and spontaneous with normal comprehension.  Cognition:    The patient is oriented to person, place, and time;     recent and remote memory intact;     language fluent;     normal attention, concentration,      fund of knowledge Cranial Nerves:    The pupils are equal, round, and reactive to light. Blurring of the fundi (papilledema vs pseudopapilledema) Visual fields are full to finger confrontation. Extraocular movements are intact. Trigeminal sensation is intact and the muscles of mastication are normal. The face is symmetric. The palate elevates in the midline. Hearing intact. Voice is normal. Shoulder shrug is normal. The tongue has normal motion without fasciculations.   Coordination:  Normal finger to nose and heel to shin. Normal rapid alternating movements.   Gait:    Heel-toe and tandem gait are normal.   Motor Observation:    No asymmetry, no atrophy, and no involuntary movements noted. Tone:    Normal muscle tone.    Posture:    Posture is normal. normal erect    Strength:    Strength is V/V in the upper and lower limbs.      Sensation: intact to LT     Reflex Exam:  DTR's:    Deep tendon reflexes in the upper and lower extremities are symmetrical bilaterally.   Toes:    The toes are downgoing bilaterally.   Clonus:    Clonus is absent.    Assessment/Plan:  39 year old patient with concerning symptoms of papilledema, pressure headaches, eye pressure/pain, morning and positional headache, vision and hearing changes needs thorough evaluation:  - Obesity, severe morning headaches, fatigue, ESS 24: Sleep evaluation for OSA vs other  Papilledema and symptoms/signs above: MRI brain, MRI orbits, evaluate for space occupying lesion, cavernous sinus pathology (pressue behind eyes, papilledema, orbital headache), IDIOPATHIC INTRACRANIAL HYPERTENSION, chiari or other  Fatigue: TSH Lumbar Puncture: opening pressure and csf labs  Orders Placed This Encounter  Procedures  . MR BRAIN W WO CONTRAST  . MR ORBITS W WO CONTRAST  . DG FLUORO GUIDED NEEDLE PLC ASPIRATION/INJECTION LOC  . TSH  . Ambulatory referral to Sleep Studies   No orders of the defined types were placed in  this encounter.   Cc: Jilda Panda, MD,  Dr. Thurston Hole.  Sarina Ill, MD  [facility] [address] [place], [place] [address]  Phone [phone] Fax [phone]

## 2019-07-09 NOTE — Patient Instructions (Signed)
MRI of the brain: will call to schedule Sleep evaluation: sleep team will call Fatigue: Check thyroid Lumbar Puncture: for opening pressure   Idiopathic Intracranial Hypertension  Idiopathic intracranial hypertension (IIH) is a condition that increases pressure around the brain. The fluid that surrounds the brain and spinal cord (cerebrospinal fluid, CSF) increases and causes the pressure. Idiopathic means that the cause of this condition is not known. IIH affects the brain and spinal cord (is a neurological disorder). If this condition is not treated, it can cause vision loss or blindness. What increases the risk? You are more likely to develop this condition if:  You are severely overweight (obese).  You are a woman who has not gone through menopause.  You take certain medicines, such as birth control or steroids. What are the signs or symptoms? Symptoms of IIH include:  Headaches. This is the most common symptom.  Pain in the shoulders or neck.  Nausea and vomiting.  A "rushing water" or pulsing sound within the ears (pulsatile tinnitus).  Double vision.  Blurred vision.  Brief episodes of complete vision loss. How is this diagnosed? This condition may be diagnosed based on:  Your symptoms.  Your medical history.  CT scan of the brain.  MRI of the brain.  Magnetic resonance venogram (MRV) to check veins in the brain.  Diagnostic lumbar puncture. This is a procedure to remove and examine a sample of cerebrospinal fluid. This procedure can determine whether too much fluid may be causing IIH.  A thorough eye exam to check for swelling or nerve damage in the eyes. How is this treated? Treatment for this condition depends on your symptoms. The goal of treatment is to decrease the pressure around your brain. Common treatments include:  Medicines to decrease the production of spinal fluid and lower the pressure within your skull.  Medicines to prevent or treat  headaches.  Surgery to place drains (shunts) in your brain to remove excess fluid.  Lumbar puncture to remove excess cerebrospinal fluid. Follow these instructions at home:  If you are overweight or obese, work with your health care provider to lose weight.  Take over-the-counter and prescription medicines only as told by your health care provider.  Do not drive or use heavy machinery while taking medicines that can make you sleepy.  Keep all follow-up visits as told by your health care provider. This is important. Contact a health care provider if:  You have changes in your vision, such as: ? Double vision. ? Not being able to see colors (color vision). Get help right away if:  You have any of the following symptoms and they get worse or do not get better. ? Headaches. ? Nausea. ? Vomiting. ? Vision changes or difficulty seeing. Summary  Idiopathic intracranial hypertension (IIH) is a condition that increases pressure around the brain. The cause is not known (is idiopathic).  The most common symptom of IIH is headaches.  Treatment may include medicines or surgery to relieve the pressure on your brain. This information is not intended to replace advice given to you by your health care provider. Make sure you discuss any questions you have with your health care provider. Document Revised: 05/05/2017 Document Reviewed: 04/13/2016 Elsevier Patient Education  2020 [employer].

## 2019-07-09 NOTE — Telephone Encounter (Signed)
Medicaid order sent to [facility]. They will obtain the auth and reach out to the patient to schedule.  

## 2019-07-09 NOTE — Progress Notes (Signed)
Epworth Sleepiness Scale 0= would never doze 1= slight chance of dozing 2= moderate chance of dozing 3= high chance of dozing  Sitting and reading: 3 Watching TV: 3 Sitting inactive in a public place (ex. Theater or meeting): 3 As a passenger in a car for an hour without a break: 3 Lying down to rest in the afternoon: 3 Sitting and talking to someone: 3 Sitting quietly after lunch (no alcohol): 3 In a car, while stopped in traffic: 3 Total: 24

## 2019-07-10 LAB — TSH: TSH: 1.91 u[IU]/mL (ref 0.450–4.500)

## 2019-07-10 NOTE — Progress Notes (Signed)
TSH normal

## 2019-07-17 ENCOUNTER — Encounter: Payer: Self-pay | Admitting: Neurology

## 2019-07-17 ENCOUNTER — Telehealth: Payer: Self-pay | Admitting: Neurology

## 2019-07-17 ENCOUNTER — Institutional Professional Consult (permissible substitution): Payer: Medicaid Other | Admitting: Neurology

## 2019-07-17 NOTE — Telephone Encounter (Signed)
PT was a no show to the sleep consult apt scheduled for today.

## 2019-07-19 ENCOUNTER — Encounter: Payer: Self-pay | Admitting: Obstetrics & Gynecology

## 2019-07-19 ENCOUNTER — Other Ambulatory Visit (HOSPITAL_COMMUNITY)
Admission: RE | Admit: 2019-07-19 | Discharge: 2019-07-19 | Disposition: A | Discharge status: Home / Self Care | Payer: Medicaid Other | Source: Ambulatory Visit | Attending: Obstetrics & Gynecology | Admitting: Obstetrics & Gynecology

## 2019-07-19 ENCOUNTER — Ambulatory Visit (INDEPENDENT_AMBULATORY_CARE_PROVIDER_SITE_OTHER): Payer: Medicaid Other | Admitting: Obstetrics & Gynecology

## 2019-07-19 ENCOUNTER — Other Ambulatory Visit: Payer: Self-pay

## 2019-07-19 VITALS — BP 141/91 | HR 89 | Ht 63.0 in | Wt 223.0 lb

## 2019-07-19 DIAGNOSIS — Z3042 Encounter for surveillance of injectable contraceptive: Secondary | ICD-10-CM | POA: Diagnosis not present

## 2019-07-19 DIAGNOSIS — D219 Benign neoplasm of connective and other soft tissue, unspecified: Secondary | ICD-10-CM | POA: Diagnosis not present

## 2019-07-19 DIAGNOSIS — N939 Abnormal uterine and vaginal bleeding, unspecified: Secondary | ICD-10-CM

## 2019-07-19 DIAGNOSIS — D649 Anemia, unspecified: Secondary | ICD-10-CM | POA: Diagnosis not present

## 2019-07-19 LAB — POCT PREGNANCY, URINE: Preg Test, Ur: NEGATIVE

## 2019-07-19 MED ORDER — MEDROXYPROGESTERONE ACETATE 150 MG/ML IM SUSP
150.0000 mg | Freq: Once | INTRAMUSCULAR | Status: AC
  Administered 2019-07-19: 11:00:00 150 mg via INTRAMUSCULAR

## 2019-07-19 NOTE — Patient Instructions (Addendum)
Hysterectomy Information  A hysterectomy is a surgery in which the uterus is removed. The fallopian tubes and ovaries may be removed (bilateral salpingo-oophorectomy) as well. This procedure may be done to treat various medical problems. After the procedure, a woman will no longer have menstrual periods nor will she be able to become pregnant (sterile). What are the reasons for a hysterectomy? There are many reasons why a woman might have this procedure. They include:  Persistent, abnormal vaginal bleeding.  Long-term (chronic) pelvic pain or infection.  Endometriosis. This is when the lining of the uterus (endometrium) starts to grow outside the uterus.  Adenomyosis. This is when the endometrium starts to grow in the muscle of the uterus.  Pelvic organ prolapse. This is a condition in which the uterus falls down into the vagina.  Noncancerous growths in the uterus (uterine fibroids) that cause symptoms.  The presence of precancerous cells.  Cervical or uterine cancer. What are the different types of hysterectomy? There are three different types of hysterectomy:  Supracervical hysterectomy. In this type, the top part of the uterus is removed, but not the cervix.  Total hysterectomy. In this type, the uterus and cervix are removed.  Radical hysterectomy. In this type, the uterus, the cervix, and the tissue that holds the uterus in place (parametrium) are removed. What are the different ways a hysterectomy can be performed? There are many different ways a hysterectomy can be performed, including:  Abdominal hysterectomy. In this type, an incision is made in the abdomen. The uterus is removed through this incision.  Vaginal hysterectomy. In this type, an incision is made in the vagina. The uterus is removed through this incision. There are no abdominal incisions.  Conventional laparoscopic hysterectomy. In this type, three or four small incisions are made in the abdomen. A thin,  lighted tube with a camera (laparoscope) is inserted into one of the incisions. Other tools are put through the other incisions. The uterus is cut into small pieces. The small pieces are removed through the incisions or through the vagina.  Laparoscopically assisted vaginal hysterectomy (LAVH). In this type, three or four small incisions are made in the abdomen. Part of the surgery is performed laparoscopically and the other part is done vaginally. The uterus is removed through the vagina.  Robot-assisted laparoscopic hysterectomy. In this type, a laparoscope and other tools are inserted into three or four small incisions in the abdomen. A computer-controlled device is used to give the surgeon a 3D image and to help control the surgical instruments. This allows for more precise movements of surgical instruments. The uterus is cut into small pieces and removed through the incisions or removed through the vagina. Discuss the options with your health care provider to determine which type is the right one for you. What are the risks? Generally, this is a safe procedure. However, problems may occur, including:  Bleeding and risk of blood transfusion. Tell your health care provider if you do not want to receive any blood products.  Blood clots in the legs or lung.  Infection.  Damage to other structures or organs.  Allergic reactions to medicines.  Changing to an abdominal hysterectomy from one of the other techniques. What to expect after a hysterectomy  You will be given pain medicine.  You may need to stay in the hospital for 1- 2 days to recover, depending on the type of hysterectomy you had.  Follow your health care provider's instructions about exercise, driving, and general activities. Ask your   health care provider what activities are safe for you.  You will need to have someone with you for the first 3-5 days after you go home.  You will need to follow up with your surgeon in 2-4  weeks after surgery to evaluate your progress.  If the ovaries are removed, you will have early menopause symptoms such as hot flashes, night sweats, and insomnia.  If you had a hysterectomy for a problem that was not cancer or not a condition that could lead to cancer, then you no longer need Pap tests. However, even if you no longer need a Pap test, a regular pelvic exam is a good idea to make sure no other problems are developing. Questions to ask your health care provider  Is a hysterectomy medically necessary? Do I have other treatment options for my condition?  What are my options for hysterectomy procedure?  What organs and tissues need to be removed?  What are the risks?  What are the benefits?  How long will I need to stay in the hospital after the procedure?  How long will I need to recover at home?  What symptoms can I expect after the procedure? Summary  A hysterectomy is a surgery in which the uterus is removed. The fallopian tubes and ovaries may be removed (bilateral salpingo-oophorectomy) as well.  This procedure may be done to treat various medical problems. After the procedure, a woman will no longer have menstrual periods nor will she be able to become pregnant.  Discuss the options with your health care provider to determine which type of hysterectomy is the right one for you. This information is not intended to replace advice given to you by your health care provider. Make sure you discuss any questions you have with your health care provider. Document Revised: 05/05/2017 Document Reviewed: 06/29/2016 Elsevier Patient Education  2020 [place].  Laparoscopically Assisted Vaginal Hysterectomy A laparoscopically assisted vaginal hysterectomy (LAVH) is a surgical procedure to remove the uterus and cervix. Sometimes, the ovaries and fallopian tubes are also removed. This surgery may be done to treat problems such as:  Noncancerous growths in the uterus  (uterine fibroids) that cause symptoms.  A condition that causes the lining of the uterus to grow in other areas (endometriosis).  Problems with pelvic support.  Cancer of the cervix, ovaries, uterus, or tissue that lines the uterus (endometrium).  Excessive (dysfunctional) uterine bleeding. During an LAVH, some of the surgical removal is done through the vagina, and the rest is done through a few small incisions in the abdomen. This technique may be an option for women who are not able to have a vaginal hysterectomy. Tell a health care provider about:  Any allergies you have.  All medicines you are taking, including vitamins, herbs, eye drops, creams, and over-the-counter medicines.  Any problems you or family members have had with anesthetic medicines.  Any blood disorders you have.  Any surgeries you have had.  Any medical conditions you have.  Whether you are pregnant or may be pregnant. What are the risks? Generally, this is a safe procedure. However, problems may occur, including:  Infection.  Bleeding.  Allergic reactions to medicines.  Damage to other structures or organs.  Difficulty breathing. What happens before the procedure? Staying hydrated Follow instructions from your health care provider about hydration, which may include:  Up to 2 hours before the procedure - you may continue to drink clear liquids, such as water, clear fruit juice, black coffee, and  plain tea. Eating and drinking restrictions Follow instructions from your health care provider about eating and drinking, which may include:  8 hours before the procedure - stop eating heavy meals or foods such as meat, fried foods, or fatty foods.  6 hours before the procedure - stop eating light meals or foods, such as toast or cereal.  6 hours before the procedure - stop drinking milk or drinks that contain milk.  2 hours before the procedure - stop drinking clear liquids. Medicines  Ask your  health care provider about: ? Changing or stopping your regular medicines. This is especially important if you are taking diabetes medicines or blood thinners. ? Taking over-the-counter medicines, vitamins, herbs, and supplements. ? Taking medicines such as aspirin and ibuprofen. These medicines can thin your blood. Do not take these medicines unless your health care provider tells you to take them.  You may be asked to take a medicine to empty your colon (bowel preparation).  You may be given antibiotic medicine to help prevent infection. General instructions  Plan to have someone take you home from the hospital or clinic.  Ask your health care provider how your surgical site will be marked or identified.  You may be asked to shower with a germ-killing soap.  Do not use any products that contain nicotine or tobacco, such as cigarettes and e-cigarettes. These can delay healing after surgery. If you need help quitting, ask your health care provider. What happens during the procedure?  To lower your risk of infection: ? Your health care team will wash or sanitize their hands. ? Hair may be removed from the surgical area. ? Your skin will be washed with soap.  An IV will be inserted into one of your veins.  You will be given one or more of the following: ? A medicine to help you relax (sedative). ? A medicine to make you fall asleep (general anesthetic).  You may have a flexible tube (catheter) put into your bladder to drain urine.  You may have a tube put through your nose or mouth down into your stomach (nasogastric tube). The nasogastric tube will remove digestive fluids and prevent nausea and vomiting.  Tight-fitting (compression) stockings will be placed on your legs to promote circulation.  Three or four small incisions will be made in your abdomen. An incision will also be made in your vagina.  Probes and tools will be inserted into the small incisions. The uterus and cervix  (and possibly the ovaries and fallopian tubes) will be removed through your vagina as well as through the small incisions that were made in the abdomen.  The incisions will then be closed with stitches (sutures). The procedure may vary among health care providers and hospitals. What happens after the procedure?  Your blood pressure, heart rate, breathing rate, and blood oxygen level will be monitored until the medicines you were given have worn off.  You may have a liquid diet at first. You will most likely return to your usual diet the day after surgery.  You will still have the urinary catheter in place. It will likely be removed the day after surgery.  You may have to wear compression stockings. These stockings help to prevent blood clots and reduce swelling in your legs.  You will be encouraged to walk as soon as possible. You will also use a device or do breathing exercises to keep your lungs clear.  Do not drive for 24 hours if you were given a  sedative. Summary  A laparoscopically assisted vaginal hysterectomy (LAVH) is a surgical procedure to remove the uterus and cervix, and sometimes the ovaries and fallopian tubes.  Follow instructions from your health care provider about eating and drinking before the procedure.  During an LAVH, some of the surgical removal is done through the vagina, and the rest is done through a few small incisions in the abdomen. This information is not intended to replace advice given to you by your health care provider. Make sure you discuss any questions you have with your health care provider. Document Revised: 07/16/2018 Document Reviewed: 08/18/2016 Elsevier Patient Education  2020 Elsevier I

## 2019-07-19 NOTE — Progress Notes (Signed)
GYNECOLOGY OFFICE VISIT NOTE  History:   Linda Thomas is a 39 y.o. [id number] here today for further discussion about management of AUB, sympptomatic anemia in the setting of uterine fibroids.  She was taking Megace 80 mg bid, this did not help her bleeding. Still having AUB, had transfusion last year. Feels tired now, not dizzy. Has mild cramping associated with the bleeding. Desires definitive management with hysterectomy.  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   She denies any other concerns.    Past Medical History:  Diagnosis Date  . Bell's palsy   . Hypertension during pregnancy  . Migraine     Past Surgical History:  Procedure Laterality Date  . CESAREAN SECTION     x2  . TUBAL LIGATION      The following portions of the patient's history were reviewed and updated as appropriate: allergies, current medications, past family history, past medical history, past social history, past surgical history and problem list.   Health Maintenance:  Normal pap and negative HRHPV on 04/01/2019.  Normal mammogram on 06/10/2019.   Review of Systems:  Pertinent items noted in HPI and remainder of comprehensive ROS otherwise negative.  Physical Exam:  BP (!) 141/91   Pulse 89   Ht 5\' 3"  (1.6 m)   Wt 223 lb (101.2 kg)   BMI 39.50 kg/m  CONSTITUTIONAL: Well-developed, well-nourished female in no acute distress.  MUSCULOSKELETAL: Normal range of motion. No edema noted. NEUROLOGIC: Alert and oriented to person, place, and time. Normal muscle tone coordination. No cranial nerve deficit noted. PSYCHIATRIC: Normal mood and affect. Normal behavior. Normal judgment and thought content. CARDIOVASCULAR: Normal heart rate noted RESPIRATORY: Effort and breath sounds normal, no problems with respiration noted ABDOMEN: No masses noted. No other overt distention noted.   PELVIC: Normal appearing external genitalia; normal appearing vaginal mucosa and  cervix.  Bloody discharge noted, moderate blood in vault.  Enlarged uterine size, no other palpable masses, no uterine or adnexal tenderness.  ENDOMETRIAL BIOPSY     The indications for endometrial biopsy were reviewed.   Risks of the biopsy including cramping, bleeding, infection, uterine perforation, inadequate specimen and need for additional procedures  were discussed. The patient states she understands and agrees to undergo procedure today. Consent was signed. Time out was performed. Urine HCG was negative. During the pelvic exam, the cervix was prepped with Betadine. A single-toothed tenaculum was placed on the anterior lip of the cervix to stabilize it. The 3 mm pipelle was introduced into the endometrial cavity without difficulty to a depth of 11cm, and there was immediate return of dark blood. Had to do multiple passes (about ten) to maximize yield of cells, all poured into two specimen receptacles.  Hopefully, a moderate amount of tissue was obtained and sent to pathology. The instruments were removed from the patient's vagina. Minimal bleeding from the cervix was noted. The patient tolerated the procedure well. Routine post-procedure instructions were given to the patient.     Labs and Imaging Results for orders placed or performed in visit on 07/19/19 (from the past 168 hour(s))  Pregnancy, urine POC   Collection Time: 07/19/19 11:32 AM  Result Value Ref Range   Preg Test, Ur NEGATIVE NEGATIVE   MM 3D SCREEN BREAST BILATERAL  Result Date: 06/10/2019 CLINICAL DATA:  Screening. EXAM: DIGITAL SCREENING BILATERAL MAMMOGRAM WITH TOMO AND CAD COMPARISON:  None. ACR Breast Density Category b: There are scattered areas of fibroglandular density.  FINDINGS: There are no findings suspicious for malignancy. Images were processed with CAD. IMPRESSION: No mammographic evidence of malignancy. A result letter of this screening mammogram will be mailed directly to the patient. RECOMMENDATION: Screening  mammogram in one year. (Code:SM-B-01Y) BI-RADS CATEGORY  1: Negative. Electronically Signed   By: Abelardo Diesel M.D.   On: 06/10/2019 15:44   US PELVIC COMPLETE WITH TRANSVAGINAL  Result Date: 04/22/2019 CLINICAL DATA:  Abnormal vaginal bleeding, symptomatic anemia, transfusion 04/17/2019 EXAM: TRANSABDOMINAL AND TRANSVAGINAL ULTRASOUND OF PELVIS TECHNIQUE: Both transabdominal and transvaginal ultrasound examinations of the pelvis were performed. Transabdominal technique was performed for global imaging of the pelvis including uterus, ovaries, adnexal regions, and pelvic cul-de-sac. It was necessary to proceed with endovaginal exam following the transabdominal exam to visualize the endometrium and LEFT ovary. COMPARISON:  None FINDINGS: Uterus Measurements: 13.5 x 7.1 x 7.5 cm = volume: 374 mL. Retroflexed. Large anterior wall leiomyoma at upper uterus 3.9 x 3.8 x 5.5 cm, transmural. Endometrium Thickness: Significant portion obscured by the large transmural/submucosal leiomyoma at upper uterus. Visualized portion measures up to 8 mm thick and is displaced posteriorly. No endometrial fluid Right ovary Measurements: 3.3 x 2.0 x 2.5 cm = volume: 8.8 mL. Normal morphology without mass Left ovary Measurements: 3.9 x 2.2 x 2.9 cm = volume: 10.2 mL. Normal morphology without mass Other findings Trace free pelvic fluid.  No adnexal masses. IMPRESSION: Large transmural leiomyoma at the upper uterine segment measuring 5.5 cm greatest size, extending submucosal and displacing the endometrial complex posteriorly. Otherwise normal exam Electronically Signed   By: Lavonia Dana M.D.   On: 04/22/2019 12:17       Assessment and Plan:      1. Abnormal uterine bleeding (AUB) 2. Symptomatic anemia 3. Fibroids Will check labs today and endometrial biopsy results; will follow up results and manage accordingly.  Does not want further oral Megace, desires Depo provea instead for now while waiting for surgery. This was  given. - medroxyPROGESTERone (DEPO-PROVERA) injection 150 mg - CBC - Ferritin - Surgical pathology Patient desires definitive management with hysterectomy.  I proposed doing a laparoscopic-assisted vaginal hysterectomy (LAVH) , possible total abdominal hysterectomy (TAH) and prophylactic bilateral salpingectomy.  No indication for oophorectomy.  Patient agrees with this proposed surgery.  The risks of surgery were discussed in detail with the patient including but not limited to: bleeding which may require transfusion or reoperation; infection which may require antibiotics; injury to bowel, bladder, ureters or other surrounding organs; need for additional procedures including laparotomy; thromboembolic phenomenon, incisional problems and other postoperative/anesthesia complications.  Patient was also advised that she will remain in house for 1-2 nights; and expected recovery time after a hysterectomy is 4-8 weeks depending on modality.  Patient was told that the likelihood that her condition and symptoms will be treated effectively with this surgical management was very high; the postoperative expectations were also discussed in detail. The patient also understands the alternative treatment options which were discussed in full. All questions were answered.  She was told that she will be contacted by our surgical scheduler regarding the time and date of her surgery; routine preoperative instructions will be given to her by the preoperative nursing team.   She is aware of need for preoperative COVID testing and subsequent quarantine from time of test to time of surgery; she will be given further preoperative instructions at that [place] screening visit.   Routine postoperative instructions will be reviewed with the patient in detail after surgery.  In the meantime, she will be in Depo Provera as given today, bleeding precautions were reviewed. Printed patient education handouts about the procedure was given to the  patient to review at home.   Routine preventative health maintenance measures emphasized. Please refer to After Visit Summary for other counseling recommendations.   Return for any gynecologic concerns.    Total face-to-face time with patient: 25 minutes.  Over 50% of encounter was spent on counseling and coordination of care.   Verita Schneiders, MD, [place] [facility], [place]

## 2019-07-20 LAB — CBC
Hematocrit: 36.5 % (ref 34.0–46.6)
Hemoglobin: 11.7 g/dL (ref 11.1–15.9)
MCH: 26.7 pg (ref 26.6–33.0)
MCHC: 32.1 g/dL (ref 31.5–35.7)
MCV: 83 fL (ref 79–97)
Platelets: 230 10*3/uL (ref 150–450)
RBC: 4.39 x10E6/uL (ref 3.77–5.28)
RDW: 16.1 % — ABNORMAL HIGH (ref 11.7–15.4)
WBC: 4.9 10*3/uL (ref 3.4–10.8)

## 2019-07-20 LAB — FERRITIN: Ferritin: 117 ng/mL (ref 15–150)

## 2019-07-22 ENCOUNTER — Telehealth: Payer: Self-pay

## 2019-07-22 LAB — SURGICAL PATHOLOGY

## 2019-07-22 NOTE — Telephone Encounter (Signed)
Called pt; explained she will need to come into front office to sign hysterectomy statement. Pt verbalized understanding. Office hours given. Hysterectomy form completed and placed in folder for front office.

## 2019-07-26 ENCOUNTER — Emergency Department (HOSPITAL_COMMUNITY)
Admission: EM | Admit: 2019-07-26 | Discharge: 2019-07-27 | Disposition: A | Discharge status: Home / Self Care | Payer: Medicaid Other | Attending: Emergency Medicine | Admitting: Emergency Medicine

## 2019-07-26 ENCOUNTER — Ambulatory Visit
Admission: RE | Admit: 2019-07-26 | Discharge: 2019-07-26 | Disposition: A | Discharge status: Home / Self Care | Payer: Medicaid Other | Source: Ambulatory Visit | Attending: Neurology | Admitting: Neurology

## 2019-07-26 ENCOUNTER — Other Ambulatory Visit: Payer: Self-pay

## 2019-07-26 ENCOUNTER — Encounter (HOSPITAL_COMMUNITY): Payer: Self-pay | Admitting: *Deleted

## 2019-07-26 DIAGNOSIS — I10 Essential (primary) hypertension: Secondary | ICD-10-CM | POA: Insufficient documentation

## 2019-07-26 DIAGNOSIS — R519 Headache, unspecified: Secondary | ICD-10-CM

## 2019-07-26 DIAGNOSIS — Z79899 Other long term (current) drug therapy: Secondary | ICD-10-CM | POA: Insufficient documentation

## 2019-07-26 DIAGNOSIS — H539 Unspecified visual disturbance: Secondary | ICD-10-CM

## 2019-07-26 DIAGNOSIS — Z87891 Personal history of nicotine dependence: Secondary | ICD-10-CM | POA: Diagnosis not present

## 2019-07-26 DIAGNOSIS — R51 Headache with orthostatic component, not elsewhere classified: Secondary | ICD-10-CM

## 2019-07-26 DIAGNOSIS — H5713 Ocular pain, bilateral: Secondary | ICD-10-CM

## 2019-07-26 DIAGNOSIS — G441 Vascular headache, not elsewhere classified: Secondary | ICD-10-CM

## 2019-07-26 DIAGNOSIS — H4710 Unspecified papilledema: Secondary | ICD-10-CM

## 2019-07-26 DIAGNOSIS — H579 Unspecified disorder of eye and adnexa: Secondary | ICD-10-CM

## 2019-07-26 DIAGNOSIS — M79602 Pain in left arm: Secondary | ICD-10-CM | POA: Insufficient documentation

## 2019-07-26 MED ORDER — GADOBENATE DIMEGLUMINE 529 MG/ML IV SOLN
20.0000 mL | Freq: Once | INTRAVENOUS | Status: AC | PRN
  Administered 2019-07-26: 20 mL via INTRAVENOUS

## 2019-07-26 NOTE — ED Triage Notes (Signed)
Pt reports she had MRI with contrast today.  Pt says that the IV was in her hand and she is having some discomfort around the area where the IV was and has "a knot' just above the IV site on her forearm. Denies extremity swelling, pulses intact.

## 2019-07-27 DIAGNOSIS — M79602 Pain in left arm: Secondary | ICD-10-CM | POA: Diagnosis not present

## 2019-07-27 NOTE — ED Notes (Signed)
Provider bedside.  Pt is alert and oriented, pain close to past IV location on left wrist area.

## 2019-07-27 NOTE — Discharge Instructions (Addendum)
Keep your left arm elevated.  You can apply warm compresses to the left arm for the next few days.  You can take ibuprofen 3 times a day.  Please return if you arm becomes swollen, more red, weakness or numbness of the arm the next 48 to 72 hours

## 2019-07-27 NOTE — ED Provider Notes (Signed)
[facility] EMERGENCY DEPARTMENT Provider Note   CSN: [id number] Arrival date & time: 07/26/19  2048     History Chief Complaint  Patient presents with  . Arm Pain    Linda Thomas is a 39 y.o. female.  The history is provided by the patient.  Arm Pain This is a new problem. The problem occurs constantly. The problem has not changed since onset.Pertinent negatives include no chest pain and no shortness of breath. Nothing aggravates the symptoms. Nothing relieves the symptoms.   Patient presents with pain in left wrist and forearm after receiving IV contrast.  Patient underwent MRI brain at the direction of her neurologist on February 19.  The IV was placed in her left hand.  She had no immediate complications, but several hours later she noted pain and swelling in her left forearm No other acute complaints.  Denies any fevers or vomiting.  No chest pain or shortness of breath.  No weakness or numbness in her arm No previous history of VTE    Past Medical History:  Diagnosis Date  . Bell's palsy   . Hypertension during pregnancy  . Migraine     Patient Active Problem List   Diagnosis Date Noted  . Fibroids 07/19/2019  . Abnormal uterine bleeding (AUB) 04/17/2019  . Symptomatic anemia 04/17/2019  . BMI 40.0-44.9, adult ([place]) 04/01/2019  . Bell's palsy 06/21/2016  . History of hypertension 06/21/2016    Past Surgical History:  Procedure Laterality Date  . CESAREAN SECTION     x2  . TUBAL LIGATION       OB History    Gravida  2   Para  2   Term  2   Preterm      AB      Living        SAB      TAB      Ectopic      Multiple      Live Births              Family History  Problem Relation Age of Onset  . Lung cancer Father   . Bleeding Disorder Neg Hx   . Pseudotumor cerebri Neg Hx     Social History   Tobacco Use  . Smoking status: Former Smoker    Quit date: 2014    Years since quitting: 7.1  .  Smokeless tobacco: Never Used  Substance Use Topics  . Alcohol use: Yes    Comment: social  . Drug use: No    Home Medications Prior to Admission medications   Medication Sig Start Date End Date Taking? Authorizing Provider  ferrous sulfate 325 (65 FE) MG EC tablet Take 1 tablet (325 mg total) by mouth 2 (two) times daily after a meal. Patient not taking: Reported on 07/19/2019 04/18/19 04/17/20  Domenic Polite, MD  lisinopril (ZESTRIL) 5 MG tablet Take 1 tablet (5 mg total) by mouth daily. 04/24/19   Sloan Leiter, MD    Allergies    Other and Penicillins  Review of Systems   Review of Systems  Constitutional: Negative for fever.  Respiratory: Negative for shortness of breath.   Cardiovascular: Negative for chest pain.  Musculoskeletal: Positive for arthralgias.  Skin: Negative for color change.  Neurological: Negative for weakness and numbness.  All other systems reviewed and are negative.   Physical Exam Updated Vital Signs BP (!) 151/86 (BP Location: Left Arm)   Pulse 69   Temp  98.2 F (36.8 C) (Oral)   Resp 15   Ht 1.6 m (5\' 3" )   Wt 101.2 kg   SpO2 100%   BMI 39.50 kg/m   Physical Exam CONSTITUTIONAL: Well developed/well nourished HEAD: Normocephalic/atraumatic EYES: EOMI ENMT: Mucous membranes moist NECK: supple no meningeal signs CV: S1/S2 noted, no murmurs/rubs/gallops noted LUNGS: Lungs are clear to auscultation bilaterally, no apparent distress ABDOMEN: soft NEURO: Pt is awake/alert/appropriate, moves all extremitiesx4.  No facial droop.   EXTREMITIES: pulses normal/equal, full ROM, no erythema or edema noted to the left upper extremity.  Distal pulses are equal intact.  There is no tenderness or redness at the site of the IV on her left hand.  There is mild swelling noted to the ulnar aspect of the mid L forearm.  No erythema.  Mild tenderness at that location.  No tenderness in the left bicep.  Full range of motion of both elbows and wrists SKIN:  warm, color normal PSYCH: no abnormalities of mood noted, alert and oriented to situation  ED Results / Procedures / Treatments   Labs (all labs ordered are listed, but only abnormal results are displayed) Labs Reviewed - No data to display  EKG None  Radiology  Procedures Procedures   Medications Ordered in ED Medications - No data to display  ED Course  I have reviewed the triage vital signs and the nursing notes.     MDM Rules/Calculators/A&P                      Left arm pain after having IV dye for an MRI.  No signs of cellulitis.  This could be early thrombophlebitis.  She denies any IV extravasation issue during the  MRI scan Advised warm compresses, elevation and NSAIDs.  She is approved for discharge home Final Clinical Impression(s) / ED Diagnoses Final diagnoses:  Left arm pain    Rx / DC Orders ED Discharge Orders    None       Ripley Fraise, MD 07/27/19 0214

## 2019-07-29 ENCOUNTER — Other Ambulatory Visit: Payer: Self-pay

## 2019-07-29 ENCOUNTER — Ambulatory Visit
Admission: RE | Admit: 2019-07-29 | Discharge: 2019-07-29 | Disposition: A | Discharge status: Home / Self Care | Payer: Medicaid Other | Source: Ambulatory Visit | Attending: Neurology | Admitting: Neurology

## 2019-07-29 VITALS — BP 185/96 | HR 73

## 2019-07-29 DIAGNOSIS — R51 Headache with orthostatic component, not elsewhere classified: Secondary | ICD-10-CM | POA: Diagnosis not present

## 2019-07-29 DIAGNOSIS — H4710 Unspecified papilledema: Secondary | ICD-10-CM

## 2019-07-29 LAB — CSF CELL COUNT WITH DIFFERENTIAL
RBC Count, CSF: 0 cells/uL
WBC, CSF: 1 cells/uL (ref 0–5)

## 2019-07-29 LAB — PROTEIN, CSF: Total Protein, CSF: 24 mg/dL (ref 15–45)

## 2019-07-29 LAB — GLUCOSE, CSF: Glucose, CSF: 72 mg/dL (ref 40–80)

## 2019-07-29 NOTE — Discharge Instructions (Signed)

## 2019-07-31 ENCOUNTER — Other Ambulatory Visit: Payer: Self-pay | Admitting: Neurology

## 2019-07-31 MED ORDER — ACETAZOLAMIDE 250 MG PO TABS: 250.0000 mg | ORAL_TABLET | Freq: Two times a day (BID) | ORAL | 6 refills | Status: DC

## 2019-08-01 ENCOUNTER — Telehealth: Payer: Self-pay | Admitting: *Deleted

## 2019-08-01 ENCOUNTER — Encounter: Payer: Self-pay | Admitting: *Deleted

## 2019-08-01 NOTE — Telephone Encounter (Signed)
Spoke with pt and discussed the results from Dr. Jaynee Eagles. Pt is agreeable to starting the Diamox. She denied any allergy to sulfa. We discussed the medication and some common side effects including but not limited to tingling in the fingers and toes, sodas may taste flat or funny, may cause increase in urine output. Pt understands the purpose of the diamox and that consistently taking twice daily will decrease the fluid around the brain. Pt was advised to call with any questions. She was encouraged to check the detailed info I will send her in mychart regarding diamox. Pt asked about her MRI brain results. 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 Pt asked when she should follow-up. She has declined to follow through with sleep consult at this time because she has so many doctors appointments. She verbalized appreciation for the call.

## 2019-08-01 NOTE — Telephone Encounter (Signed)
She already has a follow up scheduled with Amy 4/1, that is fine thanks

## 2019-08-01 NOTE — Telephone Encounter (Signed)
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 I will call it in, please let patient know thanks

## 2019-08-05 ENCOUNTER — Encounter: Payer: Self-pay | Admitting: *Deleted

## 2019-08-05 NOTE — Telephone Encounter (Signed)
Sent pt a mychart message. 

## 2019-08-12 ENCOUNTER — Encounter: Payer: Self-pay | Admitting: *Deleted

## 2019-08-29 NOTE — Progress Notes (Signed)
Per Dr. Linna Caprice, okay for patient to take acetaZOLAMIDE (DIAMOX) DOS since indication is for increased ICP.

## 2019-08-29 NOTE — Pre-Procedure Instructions (Signed)
[facility] [place], [address] [place] [address] Phone: [phone] Fax: [phone]      Your procedure is scheduled on Tuesday, March 30th, from 2:20 PM to 4:50 PM.  Report to [facility] Main Entrance "A" at 12:20 P.M., and check in at the Admitting office.  Call this number if you have problems the morning of surgery:  [phone]  Call [phone] if you have any questions prior to your surgery date Monday-Friday 8am-4pm.    Remember:  Do not eat after midnight the night before your surgery.  You may drink clear liquids until 11:20 AM the morning of your surgery.    Clear liquids allowed are: Water, Non-Citrus Juices (without pulp), Carbonated Beverages, Clear Tea, Black Coffee Only, and Gatorade        . The day of surgery:  o Drink ONE (1) Pre-Surgery Clear Ensure by 11:20 AM.   o This drink was given to you during your hospital  pre-op appointment visit. o Nothing else to drink after completing the  Pre-Surgery Clear Ensure. Please, if able, drink it in one setting. DO NOT SIP.    Take these medicines the morning of surgery with A SIP OF WATER : acetaZOLAMIDE (DIAMOX)     As of today, stop taking all Aspirin (unless instructed by your doctor) and other Aspirin containing products, Vitamins, Fish Oils, and Herbal Medications. Also stop all NSAIDS i.e. Advil, Ibuprofen, Motrin, Aleve, Anaprox, Naproxen, BC, Goody Powders, and all Supplements.   No Smoking of any kind, Tobacco, or Alcohol products 24 hours prior to your procedure. If you use a CPAP at night, you may bring all equipment for your overnight stay.                        Do not wear jewelry, make up, or nail polish.            Do not wear lotions, powders, perfumes, or deodorant.            Do not shave 48 hours prior to surgery.              Do not bring valuables to the hospital.            [facility] is not responsible for any belongings or valuables.   Contacts, glasses, dentures or bridgework may not be worn into surgery.      For patients admitted to the hospital, discharge time will be determined by your treatment team.   Patients discharged the day of surgery will not be allowed to drive home, and someone needs to stay with them for 24 hours.    Special instructions:   [facility]- Preparing For Surgery  Before surgery, you can play an important role. Because skin is not sterile, your skin needs to be as free of germs as possible. You can reduce the number of germs on your skin by washing with CHG (chlorahexidine gluconate) Soap before surgery.  CHG is an antiseptic cleaner which kills germs and bonds with the skin to continue killing germs even after washing.    Oral Hygiene is also important to reduce your risk of infection.  Remember - BRUSH YOUR TEETH THE MORNING OF SURGERY WITH YOUR REGULAR TOOTHPASTE  Please do not use if you have an allergy to CHG or antibacterial soaps. If your skin becomes reddened/irritated stop  using the CHG.  Do not shave (including legs and underarms) for at least 48 hours prior to first CHG shower. It is OK to shave your face.  Please follow these instructions carefully.   1. Shower the NIGHT BEFORE SURGERY and the MORNING OF SURGERY with CHG Soap.   2. If you chose to wash your hair, wash your hair first as usual with your normal shampoo.  3. After you shampoo, rinse your hair and body thoroughly to remove the shampoo.  4. Use CHG as you would any other liquid soap. You can apply CHG directly to the skin and wash gently with a scrungie or a clean washcloth.   5. Apply the CHG Soap to your body ONLY FROM THE NECK DOWN.  Do not use on open wounds or open sores. Avoid contact with your eyes, ears, mouth and genitals (private parts). Wash Face and genitals (private parts)  with your normal soap.   6. Wash thoroughly, paying special attention  to the area where your surgery will be performed.  7. Thoroughly rinse your body with warm water from the neck down.  8. DO NOT shower/wash with your normal soap after using and rinsing off the CHG Soap.  9. Pat yourself dry with a CLEAN TOWEL.  10. Wear CLEAN PAJAMAS to bed the night before surgery, wear comfortable clothes the morning of surgery.  11. Place CLEAN SHEETS on your bed the night of your first shower and DO NOT SLEEP WITH PETS.   Day of Surgery:   Do not apply any deodorants/lotions.  Please wear clean clothes to the hospital/surgery center.   Remember to brush your teeth WITH YOUR REGULAR TOOTHPASTE.   Please read over the following fact sheets that you were given.

## 2019-08-30 ENCOUNTER — Other Ambulatory Visit: Payer: Self-pay

## 2019-08-30 ENCOUNTER — Encounter (HOSPITAL_COMMUNITY)
Admission: RE | Admit: 2019-08-30 | Discharge: 2019-08-30 | Disposition: A | Discharge status: Home / Self Care | Payer: Medicaid Other | Source: Ambulatory Visit | Attending: Obstetrics & Gynecology | Admitting: Obstetrics & Gynecology

## 2019-08-30 ENCOUNTER — Other Ambulatory Visit (HOSPITAL_COMMUNITY)
Admission: RE | Admit: 2019-08-30 | Discharge: 2019-08-30 | Disposition: A | Discharge status: Home / Self Care | Payer: Medicaid Other | Source: Ambulatory Visit | Attending: Obstetrics & Gynecology | Admitting: Obstetrics & Gynecology

## 2019-08-30 ENCOUNTER — Encounter (HOSPITAL_COMMUNITY): Payer: Self-pay

## 2019-08-30 DIAGNOSIS — I10 Essential (primary) hypertension: Secondary | ICD-10-CM | POA: Insufficient documentation

## 2019-08-30 DIAGNOSIS — Z87891 Personal history of nicotine dependence: Secondary | ICD-10-CM | POA: Diagnosis not present

## 2019-08-30 DIAGNOSIS — Z7901 Long term (current) use of anticoagulants: Secondary | ICD-10-CM | POA: Insufficient documentation

## 2019-08-30 DIAGNOSIS — D259 Leiomyoma of uterus, unspecified: Secondary | ICD-10-CM | POA: Insufficient documentation

## 2019-08-30 DIAGNOSIS — Z20822 Contact with and (suspected) exposure to covid-19: Secondary | ICD-10-CM | POA: Insufficient documentation

## 2019-08-30 DIAGNOSIS — Z01812 Encounter for preprocedural laboratory examination: Secondary | ICD-10-CM | POA: Diagnosis not present

## 2019-08-30 DIAGNOSIS — D649 Anemia, unspecified: Secondary | ICD-10-CM | POA: Insufficient documentation

## 2019-08-30 DIAGNOSIS — Z79899 Other long term (current) drug therapy: Secondary | ICD-10-CM | POA: Insufficient documentation

## 2019-08-30 HISTORY — DX: Benign intracranial hypertension: G93.2

## 2019-08-30 HISTORY — DX: Hyperglycemia, unspecified: R73.9

## 2019-08-30 LAB — CBC
HCT: 37.9 % (ref 36.0–46.0)
Hemoglobin: 11.7 g/dL — ABNORMAL LOW (ref 12.0–15.0)
MCH: 27 pg (ref 26.0–34.0)
MCHC: 30.9 g/dL (ref 30.0–36.0)
MCV: 87.5 fL (ref 80.0–100.0)
Platelets: 244 10*3/uL (ref 150–400)
RBC: 4.33 MIL/uL (ref 3.87–5.11)
RDW: 15.5 % (ref 11.5–15.5)
WBC: 4.6 10*3/uL (ref 4.0–10.5)
nRBC: 0 % (ref 0.0–0.2)

## 2019-08-30 LAB — HEMOGLOBIN A1C
Hgb A1c MFr Bld: 6.4 % — ABNORMAL HIGH (ref 4.8–5.6)
Mean Plasma Glucose: 136.98 mg/dL

## 2019-08-30 LAB — BASIC METABOLIC PANEL
Anion gap: 7 (ref 5–15)
BUN: 15 mg/dL (ref 6–20)
CO2: 21 mmol/L — ABNORMAL LOW (ref 22–32)
Calcium: 8.7 mg/dL — ABNORMAL LOW (ref 8.9–10.3)
Chloride: 111 mmol/L (ref 98–111)
Creatinine, Ser: 0.87 mg/dL (ref 0.44–1.00)
GFR calc Af Amer: >60 mL/min (ref >60)
GFR calc non Af Amer: >60 mL/min (ref >60)
Glucose, Bld: 223 mg/dL — ABNORMAL HIGH (ref 70–99)
Potassium: 3.5 mmol/L (ref 3.5–5.1)
Sodium: 139 mmol/L (ref 135–145)

## 2019-08-30 LAB — SARS CORONAVIRUS 2 (TAT 6-24 HRS): SARS Coronavirus 2: NEGATIVE

## 2019-08-30 NOTE — Progress Notes (Signed)
Pt answered "yes" to receiving blood transfusion and staying overnight in the hospital in the past 3 months at PAT. Type and screen was drawn, but pt does not qualify for extended 14 day type and screen. Type and screen to be drawn day of surgery. Ebony Hail, [place] aware.

## 2019-08-30 NOTE — Progress Notes (Addendum)
PCP Mellody Drown, MD Cardiologist - pt denies  Chest x-ray - 04/17/19 EKG - 04/19/19 Stress Test - pt denies ECHO - pt denies Cardiac Cath - pt denies  Sleep Study - no  Pt is not diabetic but checks her blood sugars regularly about 2-3x a week.  Fasting Blood Sugar - 80s   ERAS Protcol - yes    COVID TEST-  08/30/19 After PAT appt  Coronavirus Screening  Have you experienced the following symptoms:  Cough yes/no: No Fever (>100.22F)  yes/no: No Runny nose yes/no: No Sore throat yes/no: No Difficulty breathing/shortness of breath  yes/no: No  Have you or a family member traveled in the last 14 days and where? yes/no: No   If the patient indicates "YES" to the above questions, their PAT will be rescheduled to limit the exposure to others and, the surgeon will be notified. THE PATIENT WILL NEED TO BE ASYMPTOMATIC FOR 14 DAYS.   If the patient is not experiencing any of these symptoms, the PAT nurse will instruct them to NOT bring anyone with them to their appointment since they may have these symptoms or traveled as well.   Please remind your patients and families that hospital visitation restrictions are in effect and the importance of the restrictions.     Anesthesia review: yes - elevated blood glucose (223) Ebony Hail, PA made aware. A1C requested to be added on.  Patient denies shortness of breath, fever, cough and chest pain at PAT appointment   All instructions explained to the patient, with a verbal understanding of the material. Patient agrees to go over the instructions while at home for a better understanding. Patient also instructed to self quarantine after being tested for COVID-19. The opportunity to ask questions was provided.

## 2019-09-02 ENCOUNTER — Encounter (HOSPITAL_COMMUNITY): Payer: Self-pay

## 2019-09-02 NOTE — Progress Notes (Signed)
Anesthesia Chart Review:  Case: [id number] Date/Time: 09/03/19 1405   Procedures:      LAPAROSCOPIC ASSISTED VAGINAL HYSTERECTOMY WITH SALPINGECTOMY (Bilateral )     POSSIBLE HYSTERECTOMY ABDOMINAL WITH SALPINGECTOMY (Bilateral )   Anesthesia type: Choice   Pre-op diagnosis:      AUB     Fibroids     Anemia   Location: [facility] OR ROOM 07 / [place] [facility]   Surgeons: Osborne Oman, MD      DISCUSSION: Patient is a 39 year old female scheduled for the above procedure. She was hospitalized 04/2019 for severe symptomatic anemia (HGB 5.7) secondary to menorrhagia, s/p 2 units PRBCs.    History includes former smoker (quit 06/06/12), HTN, migraines, Bell's palsy (~ 2019). She underwent recent neurology work-up by Dr. 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 Katy Fitch) with MRI and LP consistent with idiopathic intracranial hypertension, and she was prescribed acetazolamide (Diamox) on 08/01/19. BMI is consistent with morbid obesity.   No reported history of DM/prediabetes (although reported periodically checking home CBGs due to strong family history), but non-fasting glucose 223, so A1c added to labs with result of 6.4%, consistent with pre-diabetes. I notified patient of results and recommended PCP follow-up. She also inquired about if acetazolamide could be contributing to hyperglycemia. She said Dr. Jaynee Eagles discussed with her that she could experience side effects that could present like diabetes (ie, common reactions can include polyuria, glucosuria). I encouraged her to reach out to Dr. Jaynee Eagles to discuss her thoughts on this--she has only been on acetazolamide for ~ 1 month now, so difficult to say from my standpoint. She reported that she is on lisinopril for HTN, but BP 151/98. HR 106--last EKG on 04/17/19 showed SR.  BP Readings from Last 3 Encounters:  08/30/19 (!) 151/98  07/29/19 (!) 185/96  07/27/19 (!) 151/86    08/30/19 COVID-19 test negative. Per PAT RN notations, patient  will still require T&S, urine pregnancy, and 18 G PIV on arrival the day of surgery.     VS: BP (!) 151/98   Pulse (!) 106   Temp 37 C (Oral)   Resp 18   Ht 5\' 3"  (1.6 m)   Wt 102.8 kg   SpO2 100%   BMI 40.13 kg/m    PROVIDERS: Jilda Panda, MD is PCP  Sarina Ill, MD is neurologist   LABS: Preoperative labs noted. Non-fasting glucose 223 without reported DM history, so A1c added to labs with result of 6.4%, consistent with pre-diabetes.  (all labs ordered are listed, but only abnormal results are displayed)  Labs Reviewed  BASIC METABOLIC PANEL - Abnormal; Notable for the following components:      Result Value   CO2 21 (*)    Glucose, Bld 223 (*)    Calcium 8.7 (*)    All other components within normal limits  CBC - Abnormal; Notable for the following components:   Hemoglobin 11.7 (*)    All other components within normal limits  HEMOGLOBIN A1C - Abnormal; Notable for the following components:   Hgb A1c MFr Bld 6.4 (*)    All other components within normal limits     IMAGES: CXR 04/17/19: FINDINGS: The heart size and mediastinal contours are within normal limits. Both lungs are clear. The visualized skeletal structures are unremarkable. IMPRESSION: No active cardiopulmonary disease.  MRI orbits 07/26/19: IMPRESSION: This is a normal MRI of the orbits.  Incidental note is made of a reduced pituitary height and a normal sized  sella turcica.  This is usually an incidental finding but could be seen with elevated intracranial pressures.  MRI brain 07/26/19: IMPRESSION: This MRI of the brain with and without contrast shows the following: 1.   Small number of scattered T2/FLAIR hyperintense foci in the subcortical white matter.  This is a nonspecific finding but most likely represents minimal chronic microvascular ischemic changes.  Demyelination and vasculitis are less likely to have this appearance.  None of the foci appear to be acute and there are no changes  compared to the 09/24/2018 MRI. 2.   The pituitary gland is thinned within a normal sized sella turcica.  This is likely incidental though could also be seen with elevated intracranial pressures. 3.   There were no acute findings and there was a normal enhancement pattern.   EKG: 04/17/19: NSR   CV: N/A   Past Medical History:  Diagnosis Date  . Bell's palsy   . Hyperglycemia    A1c 6.4% 08/30/19  . Hypertension during pregnancy  . Hypertension   . Idiopathic intracranial hypertension   . Migraine     Past Surgical History:  Procedure Laterality Date  . CESAREAN SECTION     x2  . TUBAL LIGATION      MEDICATIONS: . acetaZOLAMIDE (DIAMOX) 250 MG tablet  . lisinopril (ZESTRIL) 5 MG tablet   No current facility-administered medications for this encounter.    Myra Gianotti, PA-C Surgical Short Stay/Anesthesiology [facility] Phone [phone] [facility] Phone [phone] 09/02/2019 10:35 AM

## 2019-09-03 ENCOUNTER — Encounter (HOSPITAL_COMMUNITY): Payer: Self-pay | Admitting: Obstetrics & Gynecology

## 2019-09-03 ENCOUNTER — Encounter (HOSPITAL_COMMUNITY)
Admission: RE | Disposition: A | Discharge status: Home / Self Care | Payer: Self-pay | Source: Home / Self Care | Attending: Obstetrics & Gynecology

## 2019-09-03 ENCOUNTER — Inpatient Hospital Stay (HOSPITAL_COMMUNITY): Payer: Medicaid Other | Admitting: Vascular Surgery

## 2019-09-03 ENCOUNTER — Other Ambulatory Visit: Payer: Self-pay

## 2019-09-03 ENCOUNTER — Inpatient Hospital Stay (HOSPITAL_COMMUNITY)
Admission: RE | Admit: 2019-09-03 | Discharge: 2019-09-06 | DRG: 742 | Disposition: A | Discharge status: Home / Self Care | Payer: Medicaid Other | Attending: Obstetrics & Gynecology | Admitting: Obstetrics & Gynecology

## 2019-09-03 DIAGNOSIS — D259 Leiomyoma of uterus, unspecified: Principal | ICD-10-CM | POA: Diagnosis present

## 2019-09-03 DIAGNOSIS — Z888 Allergy status to other drugs, medicaments and biological substances status: Secondary | ICD-10-CM

## 2019-09-03 DIAGNOSIS — D219 Benign neoplasm of connective and other soft tissue, unspecified: Secondary | ICD-10-CM | POA: Diagnosis present

## 2019-09-03 DIAGNOSIS — Z801 Family history of malignant neoplasm of trachea, bronchus and lung: Secondary | ICD-10-CM

## 2019-09-03 DIAGNOSIS — Z20822 Contact with and (suspected) exposure to covid-19: Secondary | ICD-10-CM | POA: Diagnosis present

## 2019-09-03 DIAGNOSIS — Z7289 Other problems related to lifestyle: Secondary | ICD-10-CM

## 2019-09-03 DIAGNOSIS — G932 Benign intracranial hypertension: Secondary | ICD-10-CM | POA: Diagnosis present

## 2019-09-03 DIAGNOSIS — Z79899 Other long term (current) drug therapy: Secondary | ICD-10-CM

## 2019-09-03 DIAGNOSIS — D62 Acute posthemorrhagic anemia: Secondary | ICD-10-CM | POA: Diagnosis not present

## 2019-09-03 DIAGNOSIS — G510 Bell's palsy: Secondary | ICD-10-CM | POA: Diagnosis present

## 2019-09-03 DIAGNOSIS — K5900 Constipation, unspecified: Secondary | ICD-10-CM | POA: Diagnosis not present

## 2019-09-03 DIAGNOSIS — Z90710 Acquired absence of both cervix and uterus: Secondary | ICD-10-CM | POA: Diagnosis present

## 2019-09-03 DIAGNOSIS — D649 Anemia, unspecified: Secondary | ICD-10-CM

## 2019-09-03 DIAGNOSIS — I10 Essential (primary) hypertension: Secondary | ICD-10-CM | POA: Diagnosis present

## 2019-09-03 DIAGNOSIS — Z98891 History of uterine scar from previous surgery: Secondary | ICD-10-CM

## 2019-09-03 DIAGNOSIS — N939 Abnormal uterine and vaginal bleeding, unspecified: Secondary | ICD-10-CM | POA: Diagnosis not present

## 2019-09-03 DIAGNOSIS — N736 Female pelvic peritoneal adhesions (postinfective): Secondary | ICD-10-CM

## 2019-09-03 DIAGNOSIS — Z87891 Personal history of nicotine dependence: Secondary | ICD-10-CM | POA: Diagnosis not present

## 2019-09-03 DIAGNOSIS — Z880 Allergy status to penicillin: Secondary | ICD-10-CM | POA: Diagnosis not present

## 2019-09-03 HISTORY — PX: LAPAROSCOPY: SHX197

## 2019-09-03 HISTORY — PX: HYSTERECTOMY ABDOMINAL WITH SALPINGECTOMY: SHX6725

## 2019-09-03 LAB — TYPE AND SCREEN
ABO/RH(D): B POS
Antibody Screen: NEGATIVE

## 2019-09-03 LAB — POCT PREGNANCY, URINE: Preg Test, Ur: NEGATIVE

## 2019-09-03 LAB — GLUCOSE, CAPILLARY
Glucose-Capillary: 96 mg/dL (ref 70–99)
Glucose-Capillary: 87 mg/dL (ref 70–99)
Glucose-Capillary: 195 mg/dL — ABNORMAL HIGH (ref 70–99)

## 2019-09-03 SURGERY — HYSTERECTOMY, TOTAL, ABDOMINAL, WITH SALPINGECTOMY
Anesthesia: General | Site: Abdomen

## 2019-09-03 MED ORDER — PHENYLEPHRINE 40 MCG/ML (10ML) SYRINGE FOR IV PUSH (FOR BLOOD PRESSURE SUPPORT)
PREFILLED_SYRINGE | INTRAVENOUS | Status: DC | PRN
  Administered 2019-09-03: 120 ug via INTRAVENOUS
  Administered 2019-09-03: 80 ug via INTRAVENOUS
  Administered 2019-09-03: 40 ug via INTRAVENOUS
  Administered 2019-09-03 (×2): 80 ug via INTRAVENOUS

## 2019-09-03 MED ORDER — HYDROMORPHONE HCL 1 MG/ML IJ SOLN
INTRAMUSCULAR | Status: DC | PRN
  Administered 2019-09-03 (×2): .5 mg via INTRAVENOUS

## 2019-09-03 MED ORDER — HYDROMORPHONE HCL 1 MG/ML IJ SOLN
INTRAMUSCULAR | Status: AC
  Filled 2019-09-03: qty 0.5

## 2019-09-03 MED ORDER — SUGAMMADEX SODIUM 200 MG/2ML IV SOLN
INTRAVENOUS | Status: DC | PRN
  Administered 2019-09-03: 200 mg via INTRAVENOUS

## 2019-09-03 MED ORDER — ZOLPIDEM TARTRATE 5 MG PO TABS: 5.0000 mg | ORAL_TABLET | Freq: Every evening | ORAL | Status: DC | PRN

## 2019-09-03 MED ORDER — ONDANSETRON HCL 4 MG/2ML IJ SOLN
INTRAMUSCULAR | Status: AC
  Filled 2019-09-03: qty 2

## 2019-09-03 MED ORDER — ONDANSETRON HCL 4 MG/2ML IJ SOLN
4.0000 mg | Freq: Four times a day (QID) | INTRAMUSCULAR | Status: DC | PRN
  Administered 2019-09-05: 4 mg via INTRAVENOUS
  Filled 2019-09-03: qty 2

## 2019-09-03 MED ORDER — PANTOPRAZOLE SODIUM 40 MG PO TBEC
40.0000 mg | DELAYED_RELEASE_TABLET | Freq: Every day | ORAL | Status: DC
  Administered 2019-09-04 – 2019-09-06 (×3): 40 mg via ORAL
  Filled 2019-09-03 (×3): qty 1

## 2019-09-03 MED ORDER — DEXAMETHASONE SODIUM PHOSPHATE 10 MG/ML IJ SOLN
INTRAMUSCULAR | Status: DC | PRN
  Administered 2019-09-03: 4 mg via INTRAVENOUS

## 2019-09-03 MED ORDER — ALBUMIN HUMAN 5 % IV SOLN: INTRAVENOUS | Status: DC | PRN

## 2019-09-03 MED ORDER — SENNOSIDES-DOCUSATE SODIUM 8.6-50 MG PO TABS: 1.0000 | ORAL_TABLET | Freq: Every evening | ORAL | Status: DC | PRN

## 2019-09-03 MED ORDER — ALUM & MAG HYDROXIDE-SIMETH 200-200-20 MG/5ML PO SUSP: 30.0000 mL | ORAL | Status: DC | PRN

## 2019-09-03 MED ORDER — LISINOPRIL 5 MG PO TABS
5.0000 mg | ORAL_TABLET | Freq: Every day | ORAL | Status: DC
  Administered 2019-09-05 – 2019-09-06 (×2): 5 mg via ORAL
  Filled 2019-09-03 (×3): qty 1

## 2019-09-03 MED ORDER — DOCUSATE SODIUM 100 MG PO CAPS
100.0000 mg | ORAL_CAPSULE | Freq: Two times a day (BID) | ORAL | Status: DC
  Administered 2019-09-03 – 2019-09-05 (×3): 100 mg via ORAL
  Filled 2019-09-03 (×4): qty 1

## 2019-09-03 MED ORDER — PROPOFOL 10 MG/ML IV BOLUS
INTRAVENOUS | Status: DC | PRN
  Administered 2019-09-03: 180 mg via INTRAVENOUS

## 2019-09-03 MED ORDER — SCOPOLAMINE 1 MG/3DAYS TD PT72
MEDICATED_PATCH | TRANSDERMAL | Status: AC
  Administered 2019-09-03: 1.5 mg via TRANSDERMAL
  Filled 2019-09-03: qty 1

## 2019-09-03 MED ORDER — ROCURONIUM BROMIDE 10 MG/ML (PF) SYRINGE
PREFILLED_SYRINGE | INTRAVENOUS | Status: DC | PRN
  Administered 2019-09-03: 60 mg via INTRAVENOUS
  Administered 2019-09-03: 20 mg via INTRAVENOUS
  Administered 2019-09-03: 10 mg via INTRAVENOUS

## 2019-09-03 MED ORDER — MAGNESIUM CITRATE PO SOLN: 1.0000 | Freq: Once | ORAL | Status: DC | PRN

## 2019-09-03 MED ORDER — OXYCODONE HCL 5 MG PO TABS: 5.0000 mg | ORAL_TABLET | Freq: Once | ORAL | Status: DC | PRN

## 2019-09-03 MED ORDER — FENTANYL CITRATE (PF) 250 MCG/5ML IJ SOLN
INTRAMUSCULAR | Status: DC | PRN
  Administered 2019-09-03 (×2): 100 ug via INTRAVENOUS
  Administered 2019-09-03: 50 ug via INTRAVENOUS

## 2019-09-03 MED ORDER — ACETAZOLAMIDE 250 MG PO TABS
250.0000 mg | ORAL_TABLET | Freq: Two times a day (BID) | ORAL | Status: DC
  Administered 2019-09-04 – 2019-09-06 (×5): 250 mg via ORAL
  Filled 2019-09-03 (×7): qty 1

## 2019-09-03 MED ORDER — ONDANSETRON HCL 4 MG PO TABS
4.0000 mg | ORAL_TABLET | Freq: Four times a day (QID) | ORAL | Status: DC | PRN
  Administered 2019-09-06: 4 mg via ORAL
  Filled 2019-09-03: qty 1

## 2019-09-03 MED ORDER — SODIUM CHLORIDE 0.9% FLUSH: 9.0000 mL | INTRAVENOUS | Status: DC | PRN

## 2019-09-03 MED ORDER — HYDROMORPHONE 1 MG/ML IV SOLN
INTRAVENOUS | Status: DC
  Administered 2019-09-03: 30 mg via INTRAVENOUS
  Administered 2019-09-04: 2.3 mg via INTRAVENOUS
  Administered 2019-09-04 (×2): 0.6 mg via INTRAVENOUS

## 2019-09-03 MED ORDER — DIPHENHYDRAMINE HCL 12.5 MG/5ML PO ELIX: 12.5000 mg | ORAL_SOLUTION | Freq: Four times a day (QID) | ORAL | Status: DC | PRN

## 2019-09-03 MED ORDER — NALOXONE HCL 0.4 MG/ML IJ SOLN: 0.4000 mg | INTRAMUSCULAR | Status: DC | PRN

## 2019-09-03 MED ORDER — FENTANYL CITRATE (PF) 250 MCG/5ML IJ SOLN
INTRAMUSCULAR | Status: AC
  Filled 2019-09-03: qty 5

## 2019-09-03 MED ORDER — LIDOCAINE 2% (20 MG/ML) 5 ML SYRINGE
INTRAMUSCULAR | Status: AC
  Filled 2019-09-03: qty 5

## 2019-09-03 MED ORDER — SCOPOLAMINE 1 MG/3DAYS TD PT72: 1.0000 | MEDICATED_PATCH | Freq: Once | TRANSDERMAL | Status: DC

## 2019-09-03 MED ORDER — KETOROLAC TROMETHAMINE 30 MG/ML IJ SOLN: 30.0000 mg | Freq: Once | INTRAMUSCULAR | Status: DC | PRN

## 2019-09-03 MED ORDER — ONDANSETRON HCL 4 MG/2ML IJ SOLN: 4.0000 mg | Freq: Four times a day (QID) | INTRAMUSCULAR | Status: DC | PRN

## 2019-09-03 MED ORDER — LIDOCAINE-EPINEPHRINE 1 %-1:100000 IJ SOLN
INTRAMUSCULAR | Status: AC
  Filled 2019-09-03: qty 1

## 2019-09-03 MED ORDER — MENTHOL 3 MG MT LOZG: 1.0000 | LOZENGE | OROMUCOSAL | Status: DC | PRN

## 2019-09-03 MED ORDER — BUPIVACAINE HCL (PF) 0.5 % IJ SOLN
INTRAMUSCULAR | Status: DC | PRN
  Administered 2019-09-03: 4 mL

## 2019-09-03 MED ORDER — CLINDAMYCIN PHOSPHATE 900 MG/50ML IV SOLN
900.0000 mg | INTRAVENOUS | Status: AC
  Administered 2019-09-03: 900 mg via INTRAVENOUS
  Filled 2019-09-03: qty 50

## 2019-09-03 MED ORDER — BISACODYL 10 MG RE SUPP: 10.0000 mg | Freq: Every day | RECTAL | Status: DC | PRN

## 2019-09-03 MED ORDER — HYDROMORPHONE HCL 1 MG/ML IJ SOLN: 0.5000 mg | INTRAMUSCULAR | Status: DC | PRN

## 2019-09-03 MED ORDER — DIPHENHYDRAMINE HCL 50 MG/ML IJ SOLN: 12.5000 mg | Freq: Four times a day (QID) | INTRAMUSCULAR | Status: DC | PRN

## 2019-09-03 MED ORDER — PROPOFOL 10 MG/ML IV BOLUS
INTRAVENOUS | Status: AC
  Filled 2019-09-03: qty 20

## 2019-09-03 MED ORDER — ACETAMINOPHEN 500 MG PO TABS: 1000.0000 mg | ORAL_TABLET | ORAL | Status: AC

## 2019-09-03 MED ORDER — OXYCODONE HCL 5 MG PO TABS
5.0000 mg | ORAL_TABLET | ORAL | Status: DC | PRN
  Administered 2019-09-04 – 2019-09-05 (×3): 10 mg via ORAL
  Administered 2019-09-05: 5 mg via ORAL
  Administered 2019-09-06 (×2): 10 mg via ORAL
  Filled 2019-09-03: qty 2
  Filled 2019-09-03: qty 1
  Filled 2019-09-03 (×6): qty 2

## 2019-09-03 MED ORDER — MIDAZOLAM HCL 2 MG/2ML IJ SOLN
INTRAMUSCULAR | Status: AC
  Filled 2019-09-03: qty 2

## 2019-09-03 MED ORDER — GABAPENTIN 300 MG PO CAPS
300.0000 mg | ORAL_CAPSULE | Freq: Two times a day (BID) | ORAL | Status: DC
  Administered 2019-09-03 – 2019-09-06 (×6): 300 mg via ORAL
  Filled 2019-09-03 (×6): qty 1

## 2019-09-03 MED ORDER — ACETAMINOPHEN 500 MG PO TABS
ORAL_TABLET | ORAL | Status: AC
  Administered 2019-09-03: 1000 mg via ORAL
  Filled 2019-09-03: qty 2

## 2019-09-03 MED ORDER — BUPIVACAINE HCL (PF) 0.5 % IJ SOLN
INTRAMUSCULAR | Status: AC
  Filled 2019-09-03: qty 30

## 2019-09-03 MED ORDER — LACTATED RINGERS IV SOLN: INTRAVENOUS | Status: DC

## 2019-09-03 MED ORDER — DEXAMETHASONE SODIUM PHOSPHATE 10 MG/ML IJ SOLN
INTRAMUSCULAR | Status: AC
  Filled 2019-09-03: qty 1

## 2019-09-03 MED ORDER — HYDROMORPHONE HCL 1 MG/ML IJ SOLN: 0.2500 mg | INTRAMUSCULAR | Status: DC | PRN

## 2019-09-03 MED ORDER — ROCURONIUM BROMIDE 10 MG/ML (PF) SYRINGE
PREFILLED_SYRINGE | INTRAVENOUS | Status: AC
  Filled 2019-09-03: qty 10

## 2019-09-03 MED ORDER — PHENYLEPHRINE HCL-NACL 10-0.9 MG/250ML-% IV SOLN
INTRAVENOUS | Status: DC | PRN
  Administered 2019-09-03: 50 ug/min via INTRAVENOUS

## 2019-09-03 MED ORDER — PROMETHAZINE HCL 25 MG/ML IJ SOLN: 6.2500 mg | INTRAMUSCULAR | Status: DC | PRN

## 2019-09-03 MED ORDER — HYDROMORPHONE 1 MG/ML IV SOLN
INTRAVENOUS | Status: AC
  Filled 2019-09-03: qty 30

## 2019-09-03 MED ORDER — LIDOCAINE 2% (20 MG/ML) 5 ML SYRINGE
INTRAMUSCULAR | Status: DC | PRN
  Administered 2019-09-03: 100 mg via INTRAVENOUS

## 2019-09-03 MED ORDER — MIDAZOLAM HCL 5 MG/5ML IJ SOLN
INTRAMUSCULAR | Status: DC | PRN
  Administered 2019-09-03: 2 mg via INTRAVENOUS

## 2019-09-03 MED ORDER — SOD CITRATE-CITRIC ACID 500-334 MG/5ML PO SOLN
ORAL | Status: AC
  Administered 2019-09-03: 30 mL via ORAL
  Filled 2019-09-03: qty 15

## 2019-09-03 MED ORDER — GENTAMICIN SULFATE 40 MG/ML IJ SOLN
5.0000 mg/kg | Freq: Once | INTRAVENOUS | Status: AC
  Administered 2019-09-03: 360 mg via INTRAVENOUS
  Filled 2019-09-03 (×2): qty 9

## 2019-09-03 MED ORDER — SIMETHICONE 80 MG PO CHEW: 80.0000 mg | CHEWABLE_TABLET | Freq: Four times a day (QID) | ORAL | Status: DC | PRN

## 2019-09-03 MED ORDER — ONDANSETRON HCL 4 MG/2ML IJ SOLN
INTRAMUSCULAR | Status: DC | PRN
  Administered 2019-09-03: 4 mg via INTRAVENOUS

## 2019-09-03 MED ORDER — IBUPROFEN 800 MG PO TABS
800.0000 mg | ORAL_TABLET | Freq: Three times a day (TID) | ORAL | Status: DC
  Administered 2019-09-03 – 2019-09-05 (×4): 800 mg via ORAL
  Filled 2019-09-03 (×6): qty 1

## 2019-09-03 MED ORDER — SODIUM CHLORIDE 0.9 % IR SOLN
Status: DC | PRN
  Administered 2019-09-03: 1000 mL

## 2019-09-03 MED ORDER — SOD CITRATE-CITRIC ACID 500-334 MG/5ML PO SOLN: 30.0000 mL | ORAL | Status: AC

## 2019-09-03 MED ORDER — SCOPOLAMINE 1 MG/3DAYS TD PT72: 1.0000 | MEDICATED_PATCH | TRANSDERMAL | Status: DC

## 2019-09-03 MED ORDER — MEPERIDINE HCL 25 MG/ML IJ SOLN: 6.2500 mg | INTRAMUSCULAR | Status: DC | PRN

## 2019-09-03 MED ORDER — ACETAMINOPHEN 500 MG PO TABS
1000.0000 mg | ORAL_TABLET | Freq: Four times a day (QID) | ORAL | Status: DC
  Administered 2019-09-03 – 2019-09-05 (×5): 1000 mg via ORAL
  Filled 2019-09-03 (×8): qty 2

## 2019-09-03 MED ORDER — OXYCODONE HCL 5 MG/5ML PO SOLN: 5.0000 mg | Freq: Once | ORAL | Status: DC | PRN

## 2019-09-03 SURGICAL SUPPLY — 86 items
ADH SKN CLS APL DERMABOND .7 (GAUZE/BANDAGES/DRESSINGS)
APL SKNCLS STERI-STRIP NONHPOA (GAUZE/BANDAGES/DRESSINGS)
APL SWBSTK 6 STRL LF DISP (MISCELLANEOUS) ×3
APPLICATOR COTTON TIP 6 STRL (MISCELLANEOUS) ×3 IMPLANT
APPLICATOR COTTON TIP 6IN STRL (MISCELLANEOUS) ×5
BENZOIN TINCTURE PRP APPL 2/3 (GAUZE/BANDAGES/DRESSINGS) IMPLANT
CABLE HIGH FREQUENCY MONO STRZ (ELECTRODE) IMPLANT
CANISTER SUCT 3000ML PPV (MISCELLANEOUS) ×5 IMPLANT
CELLS DAT CNTRL 66122 CELL SVR (MISCELLANEOUS) IMPLANT
CLOSURE WOUND 1/2 X4 (GAUZE/BANDAGES/DRESSINGS) ×1
COVER BACK TABLE 60X90IN (DRAPES) ×5 IMPLANT
COVER MAYO STAND STRL (DRAPES) ×5 IMPLANT
COVER WAND RF STERILE (DRAPES) ×5 IMPLANT
DECANTER SPIKE VIAL GLASS SM (MISCELLANEOUS) ×10 IMPLANT
DERMABOND ADVANCED (GAUZE/BANDAGES/DRESSINGS)
DERMABOND ADVANCED .7 DNX12 (GAUZE/BANDAGES/DRESSINGS) IMPLANT
DRAPE WARM FLUID 44X44 (DRAPES) IMPLANT
DRSG OPSITE POSTOP 3X4 (GAUZE/BANDAGES/DRESSINGS) ×5 IMPLANT
DRSG OPSITE POSTOP 4X10 (GAUZE/BANDAGES/DRESSINGS) ×5 IMPLANT
DURAPREP 26ML APPLICATOR (WOUND CARE) ×8 IMPLANT
ELECT REM PT RETURN 9FT ADLT (ELECTROSURGICAL) ×5
ELECTRODE REM PT RTRN 9FT ADLT (ELECTROSURGICAL) ×1 IMPLANT
FILTER SMOKE EVAC LAPAROSHD (FILTER) ×5 IMPLANT
GAUZE 4X4 16PLY RFD (DISPOSABLE) ×3 IMPLANT
GAUZE SPONGE 4X4 12PLY STRL LF (GAUZE/BANDAGES/DRESSINGS) ×5 IMPLANT
GLOVE BIOGEL PI IND STRL 6.5 (GLOVE) ×3 IMPLANT
GLOVE BIOGEL PI IND STRL 7.0 (GLOVE) ×15 IMPLANT
GLOVE BIOGEL PI INDICATOR 6.5 (GLOVE) ×2
GLOVE BIOGEL PI INDICATOR 7.0 (GLOVE) ×10
GLOVE ECLIPSE 7.0 STRL STRAW (GLOVE) ×10 IMPLANT
GOWN STRL REUS W/ TWL LRG LVL3 (GOWN DISPOSABLE) ×6 IMPLANT
GOWN STRL REUS W/TWL LRG LVL3 (GOWN DISPOSABLE) ×10
HEMOSTAT ARISTA ABSORB 3G PWDR (HEMOSTASIS) ×3 IMPLANT
HEMOSTAT SURGICEL 2X14 (HEMOSTASIS) ×3 IMPLANT
HIBICLENS CHG 4% 4OZ BTL (MISCELLANEOUS) ×5 IMPLANT
KIT TURNOVER KIT B (KITS) ×5 IMPLANT
LEGGING LITHOTOMY PAIR STRL (DRAPES) ×5 IMPLANT
NDL INSUFFLATION 14GA 120MM (NEEDLE) IMPLANT
NDL MAYO CATGUT SZ4 TPR NDL (NEEDLE) IMPLANT
NEEDLE HYPO 22GX1.5 SAFETY (NEEDLE) ×5 IMPLANT
NEEDLE INSUFFLATION 14GA 120MM (NEEDLE) IMPLANT
NEEDLE MAYO CATGUT SZ4 (NEEDLE) IMPLANT
NS IRRIG 1000ML POUR BTL (IV SOLUTION) ×5 IMPLANT
PACK ABDOMINAL GYN (CUSTOM PROCEDURE TRAY) ×5 IMPLANT
PACK LAVH (CUSTOM PROCEDURE TRAY) ×5 IMPLANT
PACK ROBOTIC GOWN (GOWN DISPOSABLE) ×5 IMPLANT
PACK TRENDGUARD 450 HYBRID PRO (MISCELLANEOUS) IMPLANT
PACK TRENDGUARD 600 HYBRD PROC (MISCELLANEOUS) IMPLANT
PAD ABD 8X10 STRL (GAUZE/BANDAGES/DRESSINGS) ×3 IMPLANT
PAD ARMBOARD 7.5X6 YLW CONV (MISCELLANEOUS) ×10 IMPLANT
PAD OB MATERNITY 4.3X12.25 (PERSONAL CARE ITEMS) ×5 IMPLANT
PROTECTOR NERVE ULNAR (MISCELLANEOUS) ×10 IMPLANT
RETAINER VISCERA MED (MISCELLANEOUS) ×3 IMPLANT
RETRACTOR WND ALEXIS 18 MED (MISCELLANEOUS) IMPLANT
RTRCTR WOUND ALEXIS 18CM MED (MISCELLANEOUS)
SET IRRIG TUBING LAPAROSCOPIC (IRRIGATION / IRRIGATOR) IMPLANT
SHEARS HARMONIC ACE PLUS 36CM (ENDOMECHANICALS) IMPLANT
SLEEVE ENDOPATH XCEL 5M (ENDOMECHANICALS) ×5 IMPLANT
SPECIMEN JAR MEDIUM (MISCELLANEOUS) ×5 IMPLANT
SPONGE LAP 18X18 RF (DISPOSABLE) ×16 IMPLANT
STAPLER VISISTAT 35W (STAPLE) IMPLANT
STRIP CLOSURE SKIN 1/2X4 (GAUZE/BANDAGES/DRESSINGS) ×4 IMPLANT
SUT PDS AB 0 CTX 60 (SUTURE) IMPLANT
SUT PLAIN 2 0 (SUTURE) ×5
SUT PLAIN ABS 2-0 CT1 27XMFL (SUTURE) ×1 IMPLANT
SUT VIC AB 0 CT1 18XCR BRD8 (SUTURE) ×10 IMPLANT
SUT VIC AB 0 CT1 27 (SUTURE) ×5
SUT VIC AB 0 CT1 27XBRD ANBCTR (SUTURE) ×3 IMPLANT
SUT VIC AB 0 CT1 36 (SUTURE) ×5 IMPLANT
SUT VIC AB 0 CT1 8-18 (SUTURE) ×20
SUT VIC AB 0 CTX 36 (SUTURE) ×10
SUT VIC AB 0 CTX36XBRD ANBCTRL (SUTURE) ×2 IMPLANT
SUT VIC AB 4-0 KS 27 (SUTURE) ×3 IMPLANT
SUT VICRYL 0 TIES 12 18 (SUTURE) ×5 IMPLANT
SUT VICRYL 0 UR6 27IN ABS (SUTURE) ×5 IMPLANT
SUT VICRYL 4-0 PS2 18IN ABS (SUTURE) ×10 IMPLANT
SYR CONTROL 10ML LL (SYRINGE) ×5 IMPLANT
TAPE PAPER 3X10 WHT MICROPORE (GAUZE/BANDAGES/DRESSINGS) ×3 IMPLANT
TOWEL GREEN STERILE FF (TOWEL DISPOSABLE) ×15 IMPLANT
TRAY FOLEY W/BAG SLVR 14FR (SET/KITS/TRAYS/PACK) ×5 IMPLANT
TRENDGUARD 450 HYBRID PRO PACK (MISCELLANEOUS)
TRENDGUARD 600 HYBRID PROC PK (MISCELLANEOUS)
TROCAR XCEL NON-BLD 11X100MML (ENDOMECHANICALS) IMPLANT
TROCAR XCEL NON-BLD 5MMX100MML (ENDOMECHANICALS) ×5 IMPLANT
UNDERPAD 30X36 HEAVY ABSORB (UNDERPADS AND DIAPERS) ×5 IMPLANT
WARMER LAPAROSCOPE (MISCELLANEOUS) ×5 IMPLANT

## 2019-09-03 NOTE — Anesthesia Procedure Notes (Signed)
Procedure Name: Intubation Date/Time: 09/03/2019 3:30 PM Performed by: Amadeo Garnet, CRNA Pre-anesthesia Checklist: Patient identified, Emergency Drugs available, Suction available and Patient being monitored Patient Re-evaluated:Patient Re-evaluated prior to induction Oxygen Delivery Method: Circle system utilized Preoxygenation: Pre-oxygenation with 100% oxygen Induction Type: IV induction Ventilation: Mask ventilation without difficulty Laryngoscope Size: Mac and 3 Grade View: Grade I Tube type: Oral Tube size: 7.0 mm Number of attempts: 1 Airway Equipment and Method: Stylet Placement Confirmation: ETT inserted through vocal cords under direct vision,  positive ETCO2 and breath sounds checked- equal and bilateral Secured at: 22 cm Tube secured with: Tape Dental Injury: Teeth and Oropharynx as per pre-operative assessment

## 2019-09-03 NOTE — Progress Notes (Signed)
Report attempted x 1

## 2019-09-03 NOTE — Anesthesia Preprocedure Evaluation (Addendum)
Anesthesia Evaluation  Patient identified by MRN, date of birth, ID band Patient awake    Reviewed: Allergy & Precautions, NPO status , Patient's Chart, lab work & pertinent test results  History of Anesthesia Complications Negative for: history of anesthetic complications  Airway Mallampati: II  TM Distance: >3 FB Neck ROM: Full    Dental no notable dental hx. (+) Teeth Intact, Dental Advisory Given   Pulmonary former smoker,  Quit smoking 2014   Pulmonary exam normal breath sounds clear to auscultation       Cardiovascular hypertension, Pt. on medications Normal cardiovascular exam Rhythm:Regular Rate:Normal     Neuro/Psych  Headaches, Idiopathic intracranial hypertension- on diamox negative psych ROS   GI/Hepatic negative GI ROS, Neg liver ROS,   Endo/Other  Morbid obesityBMI 40   Renal/GU negative Renal ROS  Female GU complaint AUB, fibroids, anemia     Musculoskeletal negative musculoskeletal ROS (+)   Abdominal (+) + obese,   Peds  Hematology  (+) Blood dyscrasia, anemia ,   Anesthesia Other Findings Day of surgery medications reviewed with the patient.  Reproductive/Obstetrics negative OB ROS                           Anesthesia Physical Anesthesia Plan  ASA: III  Anesthesia Plan: General   Post-op Pain Management:    Induction: Intravenous  PONV Risk Score and Plan: 4 or greater and Ondansetron, Dexamethasone, Midazolam, Scopolamine patch - Pre-op and Diphenhydramine  Airway Management Planned: Oral ETT  Additional Equipment: None  Intra-op Plan:   Post-operative Plan: Extubation in OR  Informed Consent: I have reviewed the patients History and Physical, chart, labs and discussed the procedure including the risks, benefits and alternatives for the proposed anesthesia with the patient or authorized representative who has indicated his/her understanding and  acceptance.     Dental advisory given  Plan Discussed with: CRNA  Anesthesia Plan Comments:        Anesthesia Quick Evaluation

## 2019-09-03 NOTE — Op Note (Signed)
Ewelina Delores Screws PROCEDURE DATE: 09/03/2019   PREOPERATIVE DIAGNOSES:  Symptomatic fibroids, abnormal uterine bleeding, anemia, two previous cesarean sections POSTOPERATIVE DIAGNOSES:  The same SURGEON:  Dr. Verita Schneiders ASSISTANT:  Dr. Lavonia Drafts.  An experienced assistant was required given the standard of surgical care given the complexity of the case.  This assistant was needed for exposure, dissection, suctioning, retraction, instrument exchange, and for overall help during the procedure. OPERATION:  Diagnostic laparoscopy, Total abdominal hysterectomy, Bilateral Salpingectomy ANESTHESIA:  General endotracheal  INDICATIONS: The patient is a 39 y.o. G2P2000 with the aforementioned diagnoses who desires definitive surgical management. On the preoperative visit, the risks, benefits, indications, and alternatives of the procedure were reviewed with the patient.  On the day of surgery, the risks of surgery were again discussed with the patient including but not limited to: bleeding which may require transfusion or reoperation; infection which may require antibiotics; injury to bowel, bladder, ureters or other surrounding organs; need for additional procedures; thromboembolic phenomenon, incisional problems and other postoperative/anesthesia complications. Written informed consent was obtained  FINDINGS:  Small fibroid uterus, normal ovaries and fallopian tubes bilaterally. Dense adhesions of the uterus to lateral anterior abdominal wall on the left, and dense adhesions to the bladder. Omental adhesions to the anterior abdominal wall and periumbilically.   No other abdominal/pelvic abnormality.  Normal upper abdomen.  ANESTHESIA:    General ESTIMATED BLOOD LOSS: 800 ml URINE OUTPUT: 300  ml SPECIMENS:  Uterus, cervix, bilateral fallopian tubes sent to pathology COMPLICATIONS:  None immediate.  PROCEDURE IN DETAIL:  The patient received intravenous antibiotics and had  sequential compression devices applied to her lower extremities while in the preoperative area.  She was then taken to the operating room where general anesthesia was administered and was found to be adequate.  She was placed in the dorsal lithotomy position, and was prepped and draped in a sterile manner.  A Foley catheter was inserted into her bladder and attached to constant drainage and a uterine manipulator was then advanced into the uterus.  After an adequate timeout was performed, attention was turned to the abdomen where an umbilical incision was made with the scalpel.  The Optiview 5-mm trocar and sleeve were then advanced without difficulty with the laparoscope under direct visualization into the abdomen.  The abdomen was then insufflated with carbon dioxide gas and adequate pneumoperitoneum was obtained.   A detailed survey of the patient's pelvis and abdomen revealed the findings as mentioned above.  A right mid-abdomen port was placed and graspers were used to see if the adhesions could be moved, but they appeared dense.  The uterus was also immobile.  The decision was made to convert to abdominal hysterectomy. The abdomen was desufflated and all instruments were then removed from the patient's abdomen. The uterine manipulator was removed without complications.  All incisions were closed with Dermabond.  All instruments, needles, and sponge counts were correct.   After an adequate timeout was performed, a low transverse abdominal skin incision was made. This incision was taken down to the fascia using electrocautery with care given to maintain good hemostasis. The fascia was incised in the midline and the fascial incision was then extended bilaterally using electrocautery without difficulty. The fascia was then dissected off the underlying rectus muscles using blunt and sharp dissection. The rectus muscles were split bluntly in the midline and the peritoneum entered sharply without complication. This  peritoneal incision was then extended superiorly and inferiorly with care given to prevent bowel or  bladder injury. Attention was then turned to the pelvis. A retractor was placed into the incision, and the bowel was packed away with moist laparotomy sponges. The rectus muscles had to be transected horizontally about 2 cm on each side to gain adequate exposure.  The uterine adhesions were then lysed with electrocautery to allow mobilization of uterus.  The round ligaments on each side were clamped, suture ligated with 0 Vicryl, and transected with electrocautery allowing entry into the broad ligament. Of note, all sutures used in this procedure are 0 Vicryl unless otherwise noted.  Adnexae were clamped on the patient's right side, cut, and doubly suture ligated. This procedure was repeated in an identical fashion on the left site allowing for both adnexa to remain in place.  Kelly clamps were placed on the mesosalpinx of the right fallopian tube, and the fallopian tube was excised.  The pedicle was then secured with a free tie.  A similar process was carried out on the left side, allowing for bilateral salpingectomy.  A bladder flap was then created; this involved taking down the dense adhesions to the bladder which resulted in some bleeding.  The bladder was then bluntly dissected off the lower uterine segment and cervix . The uterine arteries were then clamped, cut, and doubly suture ligated with care given to prevent ureteral injury. There was significant bleeding encountered, and the pedicles were further suture ligated for hemostasis.  The uterosacral ligaments were then clamped, cut, and ligated bilaterally.  Finally, the cardinal ligaments were clamped, cut, and ligated bilaterally.  Acutely curved clamps were placed across the vagina just under the cervix, and the specimen was amputated and sent to pathology. The vaginal cuff angles were closed with Heaney stiches with care given to incorporate the  uterosacral-cardinal ligament pedicles on both sides. The middle of the vaginal cuff was closed with a series of interrupted figure-of-eight sutures with care given to incorporate the anterior pubocervical fascia and the posterior rectovaginal fascia.   The pelvis was irrigated and hemostasis was reconfirmed at all pedicles and along the pelvic sidewall. The adhesion surfaces were ntoed to have some bleeding, this was controlled with Surgical and [place]ista.  The ureters were inspected and noted to be away from the surgical sites.  All laparotomy sponges and instruments were removed from the abdomen. The fascia was also closed in a running fashion. The subcutaneous layer was reapproximated with 2-0 plain gut. The skin was closed with a 4-0 Vicryl subcuticular stitch. Sponge, lap, needle, and instrument counts were correct times three. The patient was taken to the recovery area awake, extubated and in stable condition.   Verita Schneiders, MD, [place] for [facility], [place]

## 2019-09-03 NOTE — Anesthesia Postprocedure Evaluation (Signed)
Anesthesia Post Note  Patient: Sinclair Ship  Procedure(s) Performed: HYSTERECTOMY ABDOMINAL WITH BILATERAL SALPINGECTOMY (Bilateral Abdomen) Laparoscopy Diagnostic (N/A Abdomen)     Patient location during evaluation: PACU Anesthesia Type: General Level of consciousness: awake and alert Pain management: pain level controlled Vital Signs Assessment: post-procedure vital signs reviewed and stable Respiratory status: spontaneous breathing, nonlabored ventilation and respiratory function stable Cardiovascular status: blood pressure returned to baseline and stable Postop Assessment: no apparent nausea or vomiting Anesthetic complications: no    Last Vitals:  Vitals:   09/03/19 2034 09/03/19 2130  BP: 122/66 (!) 118/99  Pulse: (!) 107 (!) 107  Resp:  16  Temp: 36.8 C 36.7 C  SpO2: 97% 97%    Last Pain:  Vitals:   09/03/19 2130  TempSrc: Oral  PainSc:                  Sarahmarie Leavey,W. EDMOND

## 2019-09-03 NOTE — H&P (Signed)
Preoperative History and Physical  Linda Thomas is a 39 y.o. G2P2000 here for definitive surgical management of abnormal uterine bleeding. Had benign endometrial biopsy on 07/19/2019. Normal pap and negative HRHPV on 04/01/2019.  History of two previous cesarean sections.   No significant preoperative concerns.  Proposed surgery: Laparoscopic-assisted vaginal hysterectomy (LAVH), possible total abdominal hysterectomy (TAH) and prophylactic bilateral salpingectomy.  Past Medical History:  Diagnosis Date  . Bell's palsy   . Hyperglycemia    A1c 6.4% 08/30/19  . Hypertension during pregnancy  . Hypertension   . Idiopathic intracranial hypertension   . Migraine    Past Surgical History:  Procedure Laterality Date  . CESAREAN SECTION     x2  . TUBAL LIGATION     OB History  Gravida Para Term Preterm AB Living  2 2 2         SAB TAB Ectopic Multiple Live Births               # Outcome Date GA Lbr Len/2nd Weight Sex Delivery Anes PTL Lv  2 Term      CS-Unspec     1 Term      CS-Unspec     Patient denies any other pertinent gynecologic issues.   No current facility-administered medications on file prior to encounter.   Current Outpatient Medications on File Prior to Encounter  Medication Sig Dispense Refill  . lisinopril (ZESTRIL) 5 MG tablet Take 1 tablet (5 mg total) by mouth daily. 30 tablet 3   Allergies  Allergen Reactions  . Other Itching    Reaction to unknown antibiotic at age 39  . Penicillins Itching    Has patient had a PCN reaction causing immediate rash, facial/tongue/throat swelling, SOB or lightheadedness with hypotension: No Has patient had a PCN reaction causing severe rash involving mucus membranes or skin necrosis: No Has patient had a PCN reaction that required hospitalization: No Has patient had a PCN reaction occurring within the last 10 years: No If all of the above answers are "NO", then may proceed with Cephalosporin use.     Social  History:   reports that she quit smoking about 7 years ago. She has never used smokeless tobacco. She reports current alcohol use. She reports that she does not use drugs.  Family History  Problem Relation Age of Onset  . Lung cancer Father   . Bleeding Disorder Neg Hx   . Pseudotumor cerebri Neg Hx     Review of Systems: Pertinent items noted in HPI and remainder of comprehensive ROS otherwise negative.  PHYSICAL EXAM: Blood pressure (!) 150/92, pulse 87, temperature 98.7 F (37.1 C), resp. rate 18, height 5\' 3"  (1.6 m), weight 102.5 kg, last menstrual period 07/25/2019, SpO2 96 %. CONSTITUTIONAL: Well-developed, well-nourished female in no acute distress.  HENT:  Normocephalic, atraumatic, External right and left ear normal. Oropharynx is clear and moist EYES: Conjunctivae and EOM are normal. Pupils are equal, round, and reactive to light. No scleral icterus.  NECK: Normal range of motion, supple, no masses SKIN: Skin is warm and dry. No rash noted. Not diaphoretic. No erythema. No pallor. NEUROLOGIC: Alert and oriented to person, place, and time. Normal reflexes, muscle tone coordination. No cranial nerve deficit noted. PSYCHIATRIC: Normal mood and affect. Normal behavior. Normal judgment and thought content. CARDIOVASCULAR: Normal heart rate noted, regular rhythm RESPIRATORY: Effort and breath sounds normal, no problems with respiration noted ABDOMEN: Soft, nontender, nondistended. PELVIC: Deferred MUSCULOSKELETAL: Normal range of motion. No edema and  no tenderness. 2+ distal pulses.  Labs: Results for orders placed or performed during the hospital encounter of 09/03/19 (from the past 336 hour(s))  Glucose, capillary   Collection Time: 09/03/19 12:46 PM  Result Value Ref Range   Glucose-Capillary 96 70 - 99 mg/dL  Pregnancy, urine POC   Collection Time: 09/03/19 12:54 PM  Result Value Ref Range   Preg Test, Ur NEGATIVE NEGATIVE  Type and screen All cardiac and thoracic  surgeries, spinal fusions, myomectomies, craniotomies, colon & liver resections, total joint revisions, same day c-section with placenta previa or accreta   Collection Time: 09/03/19  1:15 PM  Result Value Ref Range   ABO/RH(D) B POS    Antibody Screen NEG    Sample Expiration      09/06/2019,2359 Performed at [facility], [place] [address], [place], [place] [address]   Glucose, capillary   Collection Time: 09/03/19  2:23 PM  Result Value Ref Range   Glucose-Capillary 87 70 - 99 mg/dL  Results for orders placed or performed during the hospital encounter of 08/30/19 (from the past 336 hour(s))  SARS CORONAVIRUS 2 (TAT 6-24 HRS) Nasopharyngeal Nasopharyngeal Swab   Collection Time: 08/30/19  2:56 PM   Specimen: Nasopharyngeal Swab  Result Value Ref Range   SARS Coronavirus 2 NEGATIVE NEGATIVE  Results for orders placed or performed during the hospital encounter of 08/30/19 (from the past 336 hour(s))  Basic metabolic panel   Collection Time: 08/30/19  1:57 PM  Result Value Ref Range   Sodium 139 135 - 145 mmol/L   Potassium 3.5 3.5 - 5.1 mmol/L   Chloride 111 98 - 111 mmol/L   CO2 21 (L) 22 - 32 mmol/L   Glucose, Bld 223 (H) 70 - 99 mg/dL   BUN 15 6 - 20 mg/dL   Creatinine, Ser 0.87 0.44 - 1.00 mg/dL   Calcium 8.7 (L) 8.9 - 10.3 mg/dL   GFR calc non Af Amer >60 >60 mL/min   GFR calc Af Amer >60 >60 mL/min   Anion gap 7 5 - 15  CBC   Collection Time: 08/30/19  1:57 PM  Result Value Ref Range   WBC 4.6 4.0 - 10.5 K/uL   RBC 4.33 3.87 - 5.11 MIL/uL   Hemoglobin 11.7 (L) 12.0 - 15.0 g/dL   HCT 37.9 36.0 - 46.0 %   MCV 87.5 80.0 - 100.0 fL   MCH 27.0 26.0 - 34.0 pg   MCHC 30.9 30.0 - 36.0 g/dL   RDW 15.5 11.5 - 15.5 %   Platelets 244 150 - 400 K/uL   nRBC 0.0 0.0 - 0.2 %  Hemoglobin A1c   Collection Time: 08/30/19  4:30 PM  Result Value Ref Range   Hgb A1c MFr Bld 6.4 (H) 4.8 - 5.6 %   Mean Plasma Glucose 136.98 mg/dL    Imaging Studies: US PELVIC COMPLETE WITH  TRANSVAGINAL  Result Date: 04/22/2019 CLINICAL DATA:  Abnormal vaginal bleeding, symptomatic anemia, transfusion 04/17/2019 EXAM: TRANSABDOMINAL AND TRANSVAGINAL ULTRASOUND OF PELVIS TECHNIQUE: Both transabdominal and transvaginal ultrasound examinations of the pelvis were performed. Transabdominal technique was performed for global imaging of the pelvis including uterus, ovaries, adnexal regions, and pelvic cul-de-sac. It was necessary to proceed with endovaginal exam following the transabdominal exam to visualize the endometrium and LEFT ovary. COMPARISON:  None FINDINGS: Uterus Measurements: 13.5 x 7.1 x 7.5 cm = volume: 374 mL. Retroflexed. Large anterior wall leiomyoma at upper uterus 3.9 x 3.8 x 5.5 cm, transmural. Endometrium  Thickness: Significant portion obscured by the large transmural/submucosal leiomyoma at upper uterus. Visualized portion measures up to 8 mm thick and is displaced posteriorly. No endometrial fluid Right ovary Measurements: 3.3 x 2.0 x 2.5 cm = volume: 8.8 mL. Normal morphology without mass Left ovary Measurements: 3.9 x 2.2 x 2.9 cm = volume: 10.2 mL. Normal morphology without mass Other findings Trace free pelvic fluid.  No adnexal masses. IMPRESSION: Large transmural leiomyoma at the upper uterine segment measuring 5.5 cm greatest size, extending submucosal and displacing the endometrial complex posteriorly. Otherwise normal exam Electronically Signed   By: Lavonia Dana M.D.   On: 04/22/2019 12:17     Assessment: Patient Active Problem List   Diagnosis Date Noted  . Fibroids 07/19/2019  . Abnormal uterine bleeding (AUB) 04/17/2019  . Symptomatic anemia 04/17/2019  . BMI 40.0-44.9, adult ([place]) 04/01/2019  . Bell's palsy 06/21/2016  . History of hypertension 06/21/2016    Plan: Patient will undergo surgical management with laparoscopic-assisted vaginal hysterectomy (LAVH) , possible total abdominal hysterectomy (TAH) and prophylactic bilateral salpingectomy.   The  risks of surgery were discussed in detail with the patient including but not limited to: bleeding which may require transfusion or reoperation; infection which may require antibiotics; injury to bowel, bladder, ureters or other surrounding organs; need for additional procedures including laparotomy; thromboembolic phenomenon, incisional problems and other postoperative/anesthesia complications.  Patient was also advised that she will remain in house for 1-2 nights; and expected recovery time after a hysterectomy is 4-8 weeks depending on modality.  Patient was told that the likelihood that her condition and symptoms will be treated effectively with this surgical management was very high; the postoperative expectations were also discussed in detail. Routine postoperative instructions will be reviewed with the patient and her family in detail after surgery.  The patient concurred with the proposed plan, giving informed written consent for the surgery.  Patient has been NPO since last night and she will remain NPO for procedure.  Anesthesia and OR aware.  Preoperative prophylactic antibiotics and SCDs ordered on call to the OR.  To OR when ready.    Verita Schneiders, MD, [place] for [facility], [place]

## 2019-09-03 NOTE — Transfer of Care (Signed)
Immediate Anesthesia Transfer of Care Note  Patient: Linda Thomas  Procedure(s) Performed: HYSTERECTOMY ABDOMINAL WITH BILATERAL SALPINGECTOMY (Bilateral Abdomen) Laparoscopy Diagnostic (N/A Abdomen)  Patient Location: PACU  Anesthesia Type:General  Level of Consciousness: awake, alert  and oriented  Airway & Oxygen Therapy: Patient Spontanous Breathing  Post-op Assessment: Report given to RN and Post -op Vital signs reviewed and stable  Post vital signs: Reviewed and stable  Last Vitals:  Vitals Value Taken Time  BP 137/105 09/03/19 1847  Temp 36.2 C 09/03/19 1847  Pulse 122 09/03/19 1847  Resp 12 09/03/19 1847  SpO2 100 % 09/03/19 1847    Last Pain:  Vitals:   09/03/19 1258  PainSc: 0-No pain      Patients Stated Pain Goal: 2 ([id number])  Complications: No apparent anesthesia complications

## 2019-09-04 ENCOUNTER — Encounter (HOSPITAL_COMMUNITY): Payer: Self-pay | Admitting: Obstetrics & Gynecology

## 2019-09-04 LAB — CBC
HCT: 29.4 % — ABNORMAL LOW (ref 36.0–46.0)
Hemoglobin: 9.1 g/dL — ABNORMAL LOW (ref 12.0–15.0)
MCH: 27 pg (ref 26.0–34.0)
MCHC: 31 g/dL (ref 30.0–36.0)
MCV: 87.2 fL (ref 80.0–100.0)
Platelets: 236 10*3/uL (ref 150–400)
RBC: 3.37 MIL/uL — ABNORMAL LOW (ref 3.87–5.11)
RDW: 15.5 % (ref 11.5–15.5)
WBC: 10.2 10*3/uL (ref 4.0–10.5)
nRBC: 0 % (ref 0.0–0.2)

## 2019-09-04 MED ORDER — SODIUM CHLORIDE 0.9 % IV SOLN
510.0000 mg | Freq: Once | INTRAVENOUS | Status: AC
  Administered 2019-09-04: 510 mg via INTRAVENOUS
  Filled 2019-09-04: qty 17

## 2019-09-04 MED ORDER — CHLORHEXIDINE GLUCONATE CLOTH 2 % EX PADS
6.0000 | MEDICATED_PAD | Freq: Every day | CUTANEOUS | Status: DC
  Administered 2019-09-04 – 2019-09-06 (×3): 6 via TOPICAL

## 2019-09-04 NOTE — Progress Notes (Signed)
Wasted 57mL Dilaudid PCA with Delilah Shan, RN as witness.

## 2019-09-04 NOTE — Progress Notes (Signed)
1 Day Post-Op Procedure(s) (LRB): HYSTERECTOMY ABDOMINAL WITH BILATERAL SALPINGECTOMY (Bilateral) Laparoscopy Diagnostic (N/A)  Subjective: Patient reports incisional pain and tolerating PO.  Has been OOB a little, still feels sleepy due to anesthesia. No lightheadedness or dizziness.  Objective: I have reviewed patient's vital signs, intake and output, medications and labs. BP (!) 99/50 (BP Location: Right Arm)   Pulse 81   Temp 98.4 F (36.9 C) (Oral)   Resp 15   Ht 5\' 3"  (1.6 m)   Wt 102.5 kg   LMP 07/25/2019 (Within Weeks)   SpO2 98%   BMI 40.03 kg/m  General: alert and no distress Resp: normal breath sounds, no respiratory distress Cardio: regular rate and rhythm GI: soft, mildly tender; bowel sounds normal; no masses,  No distentionand incision: clean, dry, intact and dressing in place Extremities: extremities normal, atraumatic, no cyanosis or edema and Homans sign is negative, no sign of DVT Vaginal Bleeding: minimal  Assessment: s/p Procedure(s): HYSTERECTOMY ABDOMINAL WITH BILATERAL SALPINGECTOMY (Bilateral) Laparoscopy Diagnostic (N/A): stable, progressing well and anemia  Plan: Advance diet Encourage ambulation Advance to PO medication Discontinue IV fluids when drinking well Feraheme for anemia, no need for transfusion at this time.  Abdominal binder, K pad ordered for patient Routine postoperative care, plan for discharge on 09/06/19.   LOS: 1 day    Verita Schneiders, MD 09/04/2019, 12:05 PM

## 2019-09-05 ENCOUNTER — Ambulatory Visit: Payer: Medicaid Other | Admitting: Family Medicine

## 2019-09-05 LAB — SURGICAL PATHOLOGY

## 2019-09-05 MED ORDER — SENNOSIDES-DOCUSATE SODIUM 8.6-50 MG PO TABS: 1.0000 | ORAL_TABLET | Freq: Every evening | ORAL | 2 refills | Status: DC | PRN

## 2019-09-05 MED ORDER — LACTULOSE 10 GM/15ML PO SOLN: 20.0000 g | Freq: Two times a day (BID) | ORAL | Status: DC | PRN

## 2019-09-05 MED ORDER — SENNOSIDES-DOCUSATE SODIUM 8.6-50 MG PO TABS
1.0000 | ORAL_TABLET | Freq: Two times a day (BID) | ORAL | Status: DC
  Administered 2019-09-05 – 2019-09-06 (×3): 1 via ORAL
  Filled 2019-09-05 (×3): qty 1

## 2019-09-05 MED ORDER — BISACODYL 10 MG RE SUPP: 10.0000 mg | Freq: Every day | RECTAL | Status: DC | PRN

## 2019-09-05 MED ORDER — BISACODYL 5 MG PO TBEC
5.0000 mg | DELAYED_RELEASE_TABLET | Freq: Every day | ORAL | Status: DC
  Administered 2019-09-05 – 2019-09-06 (×2): 5 mg via ORAL
  Filled 2019-09-05 (×2): qty 1

## 2019-09-05 MED ORDER — SIMETHICONE 80 MG PO CHEW
80.0000 mg | CHEWABLE_TABLET | Freq: Four times a day (QID) | ORAL | Status: DC
  Administered 2019-09-05 – 2019-09-06 (×3): 80 mg via ORAL
  Filled 2019-09-05 (×3): qty 1

## 2019-09-05 MED ORDER — OXYCODONE HCL 5 MG PO TABS: 5.0000 mg | ORAL_TABLET | Freq: Four times a day (QID) | ORAL | 0 refills | Status: DC | PRN

## 2019-09-05 MED ORDER — IBUPROFEN 800 MG PO TABS: 800.0000 mg | ORAL_TABLET | Freq: Three times a day (TID) | ORAL | 2 refills | Status: AC | PRN

## 2019-09-05 NOTE — Progress Notes (Addendum)
2 Days Post-Op Procedure(s) (LRB): HYSTERECTOMY ABDOMINAL WITH BILATERAL SALPINGECTOMY (Bilateral) Laparoscopy Diagnostic (N/A)  Subjective: Patient reports incisional pain, tolerating PO, + flatus and no problems voiding.  Has been OOB a few times, No lightheadedness or dizziness. C/o abdominal tightness, no bowel movement.   Objective: I have reviewed patient's vital signs, intake and output, medications and labs. BP 132/76   Pulse 85   Temp 98.1 F (36.7 C) (Oral)   Resp 17   Ht 5\' 3"  (1.6 m)   Wt 102.5 kg   LMP 07/25/2019 (Within Weeks)   SpO2 97%   BMI 40.03 kg/m  General: alert and no distress Resp: normal breath sounds, no respiratory distress Cardio: regular rate and rhythm GI: soft, mildly tender; bowel sounds normal; no masses,  moderate distention  incision: clean, dry, intact and dressing in place Extremities: extremities normal, atraumatic, no cyanosis or edema and Homans sign is negative, no sign of DVT Vaginal Bleeding: minimal  Assessment: s/p Procedure(s): HYSTERECTOMY ABDOMINAL WITH BILATERAL SALPINGECTOMY (Bilateral) Laparoscopy Diagnostic (N/A): stable, progressing well and anemia  Plan: Advance diet Encourage ambulation Advance to PO medication  Will start bowel regimen to help w/ constipation/distension pain.  Feraheme for anemia, no need for transfusion at this time.  Abdominal binder, K pad ordered for patient Routine postoperative care, plan for discharge on 09/06/19.   LOS: 2 days    Cherre Blanc, MD 09/05/2019, 12:17 PM

## 2019-09-05 NOTE — Discharge Instructions (Signed)
Abdominal Hysterectomy, Care After This sheet gives you information about how to care for yourself after your procedure. Your health care provider may also give you more specific instructions. If you have problems or questions, contact your health care provider. What can I expect after the procedure? After your procedure, it is common to have:  Pain.  Fatigue.  Poor appetite.  Less interest in sex.  Vaginal bleeding and discharge. You may need to use a sanitary napkin after this procedure. Follow these instructions at home: Bathing  Do not take baths, swim, or use a hot tub until your health care provider approves. Ask your health care provider if you can take showers. You may only be allowed to take sponge baths for bathing.  Keep the bandage (dressing) dry until your health care provider says it can be removed. Incision care   Follow instructions from your health care provider about how to take care of your incision. Make sure you: ? Wash your hands with soap and water before you change your bandage (dressing). If soap and water are not available, use hand sanitizer. ? Change your dressing as told by your health care provider. ? Leave stitches (sutures), skin glue, or adhesive strips in place. These skin closures may need to stay in place for 2 weeks or longer. If adhesive strip edges start to loosen and curl up, you may trim the loose edges. Do not remove adhesive strips completely unless your health care provider tells you to do that.  Check your incision area every day for signs of infection. Check for: ? Redness, swelling, or pain. ? Fluid or blood. ? Warmth. ? Pus or a bad smell. Activity  Do gentle, daily exercises as told by your health care provider. You may be told to take short walks every day and go farther each time.  Do not lift anything that is heavier than 10 lb (4.5 kg), or the limit that your health care provider tells you, until he or she says that it is  safe.  Do not drive or use heavy machinery while taking prescription pain medicine.  Do not drive for 24 hours if you were given a medicine to help you relax (sedative).  Follow your health care provider's instructions about exercise, driving, and general activities. Ask your health care provider what activities are safe for you. Lifestyle  Do not douche, use tampons, or have sex for at least 6 weeks or as told by your health care provider.  Do not drink alcohol until your health care provider approves.  Drink enough fluid to keep your urine clear or pale yellow.  Try to have someone at home with you for the first 1-2 weeks to help.  Do not use any products that contain nicotine or tobacco, such as cigarettes and e-cigarettes. These can delay healing. If you need help quitting, ask your health care provider. General instructions  Take over-the-counter and prescription medicines only as told by your health care provider.  Do not take aspirin or ibuprofen. These medicines can cause bleeding.  To prevent or treat constipation while you are taking prescription pain medicine, your health care provider may recommend that you: ? Drink enough fluid to keep your urine clear or pale yellow. ? Take over-the-counter or prescription medicines. ? Eat foods that are high in fiber, such as fresh fruits and vegetables, whole grains, and beans. ? Limit foods that are high in fat and processed sugars, such as fried and sweet foods.  Keep all   follow-up visits as told by your health care provider. This is important. Contact a health care provider if:  You have chills or fever.  You have redness, swelling, or pain around your incision.  You have fluid or blood coming from your incision.  Your incision feels warm to the touch.  You have pus or a bad smell coming from your incision.  Your incision breaks open.  You feel dizzy or light-headed.  You have pain or bleeding when you urinate.  You  have persistent diarrhea.  You have persistent nausea and vomiting.  You have abnormal vaginal discharge.  You have a rash.  You have any type of abnormal reaction or you develop an allergy to your medicine.  Your pain medicine does not help. Get help right away if:  You have a fever and your symptoms suddenly get worse.  You have severe abdominal pain.  You have shortness of breath.  You faint.  You have pain, swelling, or redness in your leg.  You have heavy vaginal bleeding with blood clots. Summary  After your procedure, it is common to have pain, fatigue and vaginal discharge.  Do not take baths, swim, or use a hot tub until your health care provider approves. Ask your health care provider if you can take showers. You may only be allowed to take sponge baths for bathing.  Follow your health care provider's instructions about exercise, driving, and general activities. Ask your health care provider what activities are safe for you.  Do not lift anything that is heavier than 10 lb (4.5 kg), or the limit that your health care provider tells you, until he or she says that it is safe.  Try to have someone at home with you for the first 1-2 weeks to help. This information is not intended to replace advice given to you by your health care provider. Make sure you discuss any questions you have with your health care provider. Document Revised: 06/26/2018 Document Reviewed: 05/11/2016 Elsevier Patient Education  2020 [employer]  

## 2019-09-06 MED ORDER — PROMETHAZINE HCL 25 MG PO TABS: 25.0000 mg | ORAL_TABLET | Freq: Four times a day (QID) | ORAL | 2 refills | Status: DC | PRN

## 2019-09-06 MED ORDER — PROMETHAZINE HCL 25 MG PO TABS: 25.0000 mg | ORAL_TABLET | Freq: Four times a day (QID) | ORAL | Status: DC | PRN

## 2019-09-06 NOTE — Progress Notes (Signed)
Pt vomited large brownish fluid tonight. Pt stated she had been nauseated on and off the whole day. Pt still did not have bowel movent but states she is passing gas. PRN zofran given. Pain med given. Will monitor pt.

## 2019-09-06 NOTE — Progress Notes (Signed)
AVS given and reviewed with pt. Medications discussed. All questions answered to satisfaction. Pt verbalized understanding of information given. Pt to be escorted off the unit with all belongings via wheelchair by volunteer services.

## 2019-09-06 NOTE — Discharge Summary (Signed)
Gynecology Physician Postoperative Discharge Summary  Patient ID: Lakaila Perrault MRN: [id number] DOB/AGE: May 03, 1981 39 y.o.  Admit Date: 09/03/2019 Discharge Date: 09/06/2019  Preoperative Diagnoses: Symptomatic fibroids, abnormal uterine bleeding, anemia, two previous cesarean sections  Procedures: Procedure(s) (LRB): HYSTERECTOMY ABDOMINAL WITH BILATERAL SALPINGECTOMY (Bilateral) Laparoscopy Diagnostic (N/A)   Hospital Course:  Morelia Cluck is a 39 y.o. G2P2000  admitted for scheduled surgery.  She underwent the procedures as mentioned above, her operation was complicated by presence of significant adhesive disease. For further details about surgery, please refer to the operative report. Patient had an uncomplicated postoperative course. By time of discharge on POD#3, her pain was controlled on oral pain medications; she was ambulating, voiding without difficulty, tolerating regular diet and passing flatus. She did endorse some nausea at night, prescribed antiemetics to help with this.   She was deemed stable for discharge to home.   Significant Labs: CBC Latest Ref Rng & Units 09/04/2019 08/30/2019 07/19/2019  WBC 4.0 - 10.5 K/uL 10.2 4.6 4.9  Hemoglobin 12.0 - 15.0 g/dL 5.5(D) 11.7(L) 11.7  Hematocrit 36.0 - 46.0 % 29.4(L) 37.9 36.5  Platelets 150 - 400 K/uL 236 244 230    Discharge Exam: Blood pressure 128/71, pulse 80, temperature 98.5 F (36.9 C), temperature source Oral, resp. rate 16, height 5\' 3"  (1.6 m), weight 102.5 kg, last menstrual period 07/25/2019, SpO2 99 %. General appearance: alert and no distress  Resp: clear to auscultation bilaterally  Cardio: regular rate and rhythm  GI: soft, non-tender; mildly distended, bowel sounds normal; no masses, no organomegaly.  Incision: C/D/I, no erythema, no drainage noted Pelvic: scant blood on pad reported Extremities: extremities normal, atraumatic, no cyanosis or edema and Homans sign is negative, no sign  of DVT  Discharged Condition: Stable  Discharge disposition: 01-Home or Self Care      Allergies as of 09/06/2019      Reactions   Other Itching   Reaction to unknown antibiotic at age 41   Penicillins Itching   Has patient had a PCN reaction causing immediate rash, facial/tongue/throat swelling, SOB or lightheadedness with hypotension: No Has patient had a PCN reaction causing severe rash involving mucus membranes or skin necrosis: No Has patient had a PCN reaction that required hospitalization: No Has patient had a PCN reaction occurring within the last 10 years: No If all of the above answers are "NO", then may proceed with Cephalosporin use.      Medication List    TAKE these medications   acetaZOLAMIDE 250 MG tablet Commonly known as: DIAMOX Take 1 tablet (250 mg total) by mouth 2 (two) times daily.   ibuprofen 800 MG tablet Commonly known as: ADVIL Take 1 tablet (800 mg total) by mouth 3 (three) times daily with meals as needed for headache, mild pain, moderate pain or cramping.   lisinopril 5 MG tablet Commonly known as: ZESTRIL Take 1 tablet (5 mg total) by mouth daily.   oxyCODONE 5 MG immediate release tablet Commonly known as: Oxy IR/ROXICODONE Take 1 tablet (5 mg total) by mouth every 6 (six) hours as needed for severe pain or breakthrough pain.   promethazine 25 MG tablet Commonly known as: PHENERGAN Take 1 tablet (25 mg total) by mouth every 6 (six) hours as needed for nausea or vomiting.   senna-docusate 8.6-50 MG tablet Commonly known as: Senokot-S Take 1 tablet by mouth at bedtime as needed for mild constipation or moderate constipation.      Future Appointments  Date Time Provider  Department Center  09/20/2019  8:35 AM Maddyson Keil, Jethro Bastos, MD [facility]  10/17/2019  9:35 AM Aliviya Schoeller, Jethro Bastos, MD [facility]     Jaynie Collins, MD, FACOG Obstetrician & Gynecologist, [facility] for [facility], [facility]

## 2019-09-06 NOTE — Plan of Care (Signed)
  Problem: Education: Goal: Knowledge of General Education information will improve Description: Including pain rating scale, medication(s)/side effects and non-pharmacologic comfort measures Outcome: Progressing   Problem: Health Behavior/Discharge Planning: Goal: Ability to manage health-related needs will improve Outcome: Progressing   Problem: Clinical Measurements: Goal: Ability to maintain clinical measurements within normal limits will improve Outcome: Progressing Goal: Respiratory complications will improve Outcome: Progressing   Problem: Activity: Goal: Risk for activity intolerance will decrease Description: Ambulate with assit Outcome: Progressing   Problem: Nutrition: Goal: Adequate nutrition will be maintained Outcome: Progressing   Problem: Elimination: Goal: Will not experience complications related to bowel motility Outcome: Progressing Goal: Will not experience complications related to urinary retention Outcome: Progressing   Problem: Pain Managment: Goal: General experience of comfort will improve Outcome: Progressing   Problem: Safety: Goal: Ability to remain free from injury will improve Outcome: Progressing   Problem: Skin Integrity: Goal: Risk for impaired skin integrity will decrease Outcome: Progressing

## 2019-09-08 ENCOUNTER — Other Ambulatory Visit: Payer: Self-pay

## 2019-09-08 ENCOUNTER — Emergency Department (HOSPITAL_COMMUNITY)
Admission: EM | Admit: 2019-09-08 | Discharge: 2019-09-08 | Disposition: A | Discharge status: Home / Self Care | Payer: Medicaid Other | Attending: Emergency Medicine | Admitting: Emergency Medicine

## 2019-09-08 DIAGNOSIS — Z79899 Other long term (current) drug therapy: Secondary | ICD-10-CM | POA: Diagnosis not present

## 2019-09-08 DIAGNOSIS — Z90710 Acquired absence of both cervix and uterus: Secondary | ICD-10-CM | POA: Insufficient documentation

## 2019-09-08 DIAGNOSIS — I10 Essential (primary) hypertension: Secondary | ICD-10-CM | POA: Insufficient documentation

## 2019-09-08 DIAGNOSIS — Z5189 Encounter for other specified aftercare: Secondary | ICD-10-CM | POA: Diagnosis not present

## 2019-09-08 DIAGNOSIS — G510 Bell's palsy: Secondary | ICD-10-CM | POA: Diagnosis not present

## 2019-09-08 DIAGNOSIS — Z4801 Encounter for change or removal of surgical wound dressing: Secondary | ICD-10-CM | POA: Diagnosis not present

## 2019-09-08 DIAGNOSIS — Z87891 Personal history of nicotine dependence: Secondary | ICD-10-CM | POA: Insufficient documentation

## 2019-09-08 MED ORDER — OXYCODONE HCL 5 MG PO TABS
10.0000 mg | ORAL_TABLET | Freq: Once | ORAL | Status: AC
  Administered 2019-09-08: 10 mg via ORAL
  Filled 2019-09-08: qty 2

## 2019-09-08 NOTE — ED Provider Notes (Signed)
[facility] EMERGENCY DEPARTMENT Provider Note   CSN: [id number] Arrival date & time: 09/08/19  0103     History Chief Complaint  Patient presents with  . Drainage from Incision    Linda Thomas is a 39 y.o. female.  Patient presents to the emergency department with a chief complaint of wound check.  She states that she is status post 6 days abdominal hysterectomy.  She had her surgery on 3/30 by Dr. Laray Anger.  She was released from the hospital on 4/2.  She states that today she noticed some discharge from her wound, and wanted to have it evaluated.  She denies any fevers, chills, nausea, vomiting.  She states that she has had some persistent pain since the surgery.  She did not take her evening pain pill because she has been in the hospital.  She denies any new or worsening abdominal pain, but states that it is harder to sit up because she does not have a hospital bed rail.  She denies any other associated symptoms.  The history is provided by the patient. No language interpreter was used.       Past Medical History:  Diagnosis Date  . Bell's palsy   . Hyperglycemia    A1c 6.4% 08/30/19  . Hypertension during pregnancy  . Hypertension   . Idiopathic intracranial hypertension   . Migraine     Patient Active Problem List   Diagnosis Date Noted  . S/P TAH (total abdominal hysterectomy) 09/03/2019  . Fibroids 07/19/2019  . Abnormal uterine bleeding (AUB) 04/17/2019  . Anemia 04/17/2019  . BMI 40.0-44.9, adult ([place]) 04/01/2019  . Essential hypertension 06/21/2016    Past Surgical History:  Procedure Laterality Date  . CESAREAN SECTION     x2  . HYSTERECTOMY ABDOMINAL WITH SALPINGECTOMY Bilateral 09/03/2019   Procedure: HYSTERECTOMY ABDOMINAL WITH BILATERAL SALPINGECTOMY;  Surgeon: Osborne Oman, MD;  Location: [facility] [place];  Service: Gynecology;  Laterality: Bilateral;  . LAPAROSCOPY N/A 09/03/2019   Procedure: Laparoscopy Diagnostic;  Surgeon:  Osborne Oman, MD;  Location: [place];  Service: Gynecology;  Laterality: N/A;  . TUBAL LIGATION       OB History    Gravida  2   Para  2   Term  2   Preterm      AB      Living        SAB      TAB      Ectopic      Multiple      Live Births              Family History  Problem Relation Age of Onset  . Lung cancer Father   . Bleeding Disorder Neg Hx   . Pseudotumor cerebri Neg Hx     Social History   Tobacco Use  . Smoking status: Former Smoker    Quit date: 2014    Years since quitting: 7.2  . Smokeless tobacco: Never Used  Substance Use Topics  . Alcohol use: Yes    Comment: social  . Drug use: No    Home Medications Prior to Admission medications   Medication Sig Start Date End Date Taking? Authorizing Provider  acetaZOLAMIDE (DIAMOX) 250 MG tablet Take 1 tablet (250 mg total) by mouth 2 (two) times daily. 07/31/19   Melvenia Beam, MD  ibuprofen (ADVIL) 800 MG tablet Take 1 tablet (800 mg total) by mouth 3 (three) times daily with meals as needed  for headache, mild pain, moderate pain or cramping. 09/05/19   Anyanwu, Sallyanne Havers, MD  lisinopril (ZESTRIL) 5 MG tablet Take 1 tablet (5 mg total) by mouth daily. 04/24/19   Sloan Leiter, MD  oxyCODONE (OXY IR/ROXICODONE) 5 MG immediate release tablet Take 1 tablet (5 mg total) by mouth every 6 (six) hours as needed for severe pain or breakthrough pain. 09/05/19   Anyanwu, Sallyanne Havers, MD  promethazine (PHENERGAN) 25 MG tablet Take 1 tablet (25 mg total) by mouth every 6 (six) hours as needed for nausea or vomiting. 09/06/19   Anyanwu, Sallyanne Havers, MD  senna-docusate (SENOKOT-S) 8.6-50 MG tablet Take 1 tablet by mouth at bedtime as needed for mild constipation or moderate constipation. 09/05/19   Anyanwu, Sallyanne Havers, MD    Allergies    Other and Penicillins  Review of Systems   Review of Systems  All other systems reviewed and are negative.   Physical Exam Updated Vital Signs BP 135/72 (BP Location: Right  Arm)   Pulse 91   Temp 98.5 F (36.9 C) (Oral)   Resp 18   Ht 5\' 3"  (1.6 m)   Wt 102.5 kg   LMP 07/25/2019 (Within Weeks)   SpO2 100%   BMI 40.03 kg/m   Physical Exam Vitals and nursing note reviewed.  Constitutional:      General: She is not in acute distress.    Appearance: She is well-developed.  HENT:     Head: Normocephalic and atraumatic.  Eyes:     Conjunctiva/sclera: Conjunctivae normal.  Cardiovascular:     Rate and Rhythm: Normal rate and regular rhythm.     Heart sounds: No murmur.  Pulmonary:     Effort: Pulmonary effort is normal. No respiratory distress.     Breath sounds: Normal breath sounds.  Abdominal:     Palpations: Abdomen is soft.     Tenderness: There is no abdominal tenderness.     Comments: Clean dry and intact appearing lower abdominal incision, there is some mild serosanguineous discharge on the dressing, but nothing that appears like pus, there is no redness, no evidence of infection, no dehiscence  Musculoskeletal:     Cervical back: Neck supple.  Skin:    General: Skin is warm and dry.  Neurological:     Mental Status: She is alert and oriented to person, place, and time.  Psychiatric:        Mood and Affect: Mood normal.        Behavior: Behavior normal.     ED Results / Procedures / Treatments   Labs (all labs ordered are listed, but only abnormal results are displayed) Labs Reviewed - No data to display  EKG None  Radiology No results found.  Procedures Procedures (including critical care time)  Medications Ordered in ED Medications  oxyCODONE (Oxy IR/ROXICODONE) immediate release tablet 10 mg (has no administration in time range)    ED Course  I have reviewed the triage vital signs and the nursing notes.  Pertinent labs & imaging results that were available during my care of the patient were reviewed by me and considered in my medical decision making (see chart for details).    MDM Rules/Calculators/A&P                       Patient here for wound check.  She had an abdominal hysterectomy about 6 days ago.  She states that she noticed a small amount of discharge from the  wound, and wanted to be evaluated.  The discharge appears serosanguineous.  There is no redness, no fluctuance, no purulent discharge.  The wound is clean, dry, and intact.  I see no concerning findings on my exam.  Her vital signs are stable.  She is instructed to follow-up with her OB/GYN.  Return precautions discussed.   Final Clinical Impression(s) / ED Diagnoses Final diagnoses:  Visit for wound check    Rx / DC Orders ED Discharge Orders    None       Montine Circle, PA-C 09/08/19 [place], MD 09/08/19 [phone]

## 2019-09-08 NOTE — Discharge Instructions (Addendum)
Return to the ER for fever, redness, or foul smelling discharge.

## 2019-09-08 NOTE — ED Notes (Signed)
Pt was discharged from the ED. Pt read and understood discharge paperwork. Pt had vital signs completed. Pt conscious, breathing, and A&Ox4. No distress noted. Pt speaking in complete sentences. Pt ambulated out of the ED with a smooth and steady gait. E-signature not available.  

## 2019-09-08 NOTE — ED Triage Notes (Signed)
Per pt she had a hysterectomy on Tuesday and was released on Friday. Pt said her incision is leaking and thinks maybe a stitch has busted. Said its not draining bad but has some on her pants.

## 2019-09-20 ENCOUNTER — Other Ambulatory Visit: Payer: Self-pay

## 2019-09-20 ENCOUNTER — Ambulatory Visit (INDEPENDENT_AMBULATORY_CARE_PROVIDER_SITE_OTHER): Payer: Medicaid Other | Admitting: Obstetrics & Gynecology

## 2019-09-20 ENCOUNTER — Encounter: Payer: Self-pay | Admitting: Obstetrics & Gynecology

## 2019-09-20 VITALS — BP 141/96 | HR 92 | Wt 217.3 lb

## 2019-09-20 DIAGNOSIS — Z09 Encounter for follow-up examination after completed treatment for conditions other than malignant neoplasm: Secondary | ICD-10-CM

## 2019-09-20 DIAGNOSIS — Z90710 Acquired absence of both cervix and uterus: Secondary | ICD-10-CM

## 2019-09-20 NOTE — Progress Notes (Signed)
    Subjective:     Linda Thomas is a 39 y.o. female who presents to the clinic 2 weeks status post total abdominal hysterectomy and bilateral salpingectomy for abnormal uterine bleeding and fibroids. Eating a regular diet without difficulty. Bowel movements are normal. Pain is controlled with current analgesics. Medications being used: prescription NSAID's including Ibuprofen.  The following portions of the patient's history were reviewed and updated as appropriate: allergies, current medications, past family history, past medical history, past social history, past surgical history and problem list.  Review of Systems Pertinent items noted in HPI and remainder of comprehensive ROS otherwise negative.    Objective:    BP (!) 141/96   Pulse 92   Wt 217 lb 4.8 oz (98.6 kg)   LMP 07/25/2019 (Within Weeks)   BMI 38.49 kg/m  General:  alert, cooperative and no distress  Abdomen: soft, bowel sounds active, non-tender  Incision:   healing well, no drainage, no erythema, no hernia, no seroma, no swelling, no dehiscence, incision well approximated   09/03/19 Surgical pathology UTERUS, CERVIX, BILATERAL FALLOPIAN TUBES, HYSTERECTOMY:  - Uterus with leiomyomata, largest measuring 5.5 cm  - Benign inactive endometrium  - Benign unremarkable cervix  - Benign unremarkable bilateral fallopian tubes  - No evidence of malignancy      Assessment:    Doing well postoperatively. Operative findings again reviewed. Pathology report discussed.    Plan:     1. Continue any current medications. 2. Wound care discussed. 3. Activity restrictions: pelvic rest for 8 weeks after surgery 4. Anticipated return to work: 8 weeks. 5. Follow up: 4 weeks for pelvic exam.    Verita Schneiders, MD, [place], [facility] for [employer], [place]

## 2019-09-20 NOTE — Patient Instructions (Signed)
Return to clinic for any scheduled appointments or for any gynecologic concerns as needed.   

## 2019-10-06 ENCOUNTER — Emergency Department (HOSPITAL_COMMUNITY)
Admission: EM | Admit: 2019-10-06 | Discharge: 2019-10-06 | Disposition: A | Discharge status: Home / Self Care | Payer: Medicaid Other | Attending: Emergency Medicine | Admitting: Emergency Medicine

## 2019-10-06 ENCOUNTER — Emergency Department (HOSPITAL_COMMUNITY): Payer: Medicaid Other

## 2019-10-06 ENCOUNTER — Other Ambulatory Visit: Payer: Self-pay

## 2019-10-06 ENCOUNTER — Encounter (HOSPITAL_COMMUNITY): Payer: Self-pay | Admitting: Emergency Medicine

## 2019-10-06 DIAGNOSIS — Z4800 Encounter for change or removal of nonsurgical wound dressing: Secondary | ICD-10-CM | POA: Diagnosis not present

## 2019-10-06 DIAGNOSIS — N898 Other specified noninflammatory disorders of vagina: Secondary | ICD-10-CM | POA: Diagnosis not present

## 2019-10-06 DIAGNOSIS — Z5189 Encounter for other specified aftercare: Secondary | ICD-10-CM

## 2019-10-06 DIAGNOSIS — L02211 Cutaneous abscess of abdominal wall: Secondary | ICD-10-CM | POA: Diagnosis not present

## 2019-10-06 DIAGNOSIS — R102 Pelvic and perineal pain: Secondary | ICD-10-CM | POA: Diagnosis not present

## 2019-10-06 DIAGNOSIS — I10 Essential (primary) hypertension: Secondary | ICD-10-CM | POA: Insufficient documentation

## 2019-10-06 DIAGNOSIS — Z87891 Personal history of nicotine dependence: Secondary | ICD-10-CM | POA: Insufficient documentation

## 2019-10-06 LAB — URINALYSIS, ROUTINE W REFLEX MICROSCOPIC
Bacteria, UA: NONE SEEN
Bilirubin Urine: NEGATIVE
Glucose, UA: NEGATIVE mg/dL
Ketones, ur: NEGATIVE mg/dL
Nitrite: NEGATIVE
Protein, ur: NEGATIVE mg/dL
Specific Gravity, Urine: 1.02 (ref 1.005–1.030)
pH: 5 (ref 5.0–8.0)

## 2019-10-06 LAB — CBC WITH DIFFERENTIAL/PLATELET
Abs Immature Granulocytes: 0.01 10*3/uL (ref 0.00–0.07)
Basophils Absolute: 0 10*3/uL (ref 0.0–0.1)
Basophils Relative: 1 %
Eosinophils Absolute: 0.1 10*3/uL (ref 0.0–0.5)
Eosinophils Relative: 3 %
HCT: 38.6 % (ref 36.0–46.0)
Hemoglobin: 11.4 g/dL — ABNORMAL LOW (ref 12.0–15.0)
Immature Granulocytes: 0 %
Lymphocytes Relative: 40 %
Lymphs Abs: 2 10*3/uL (ref 0.7–4.0)
MCH: 26.7 pg (ref 26.0–34.0)
MCHC: 29.5 g/dL — ABNORMAL LOW (ref 30.0–36.0)
MCV: 90.4 fL (ref 80.0–100.0)
Monocytes Absolute: 0.4 10*3/uL (ref 0.1–1.0)
Monocytes Relative: 9 %
Neutro Abs: 2.5 10*3/uL (ref 1.7–7.7)
Neutrophils Relative %: 47 %
Platelets: 272 10*3/uL (ref 150–400)
RBC: 4.27 MIL/uL (ref 3.87–5.11)
RDW: 14.7 % (ref 11.5–15.5)
WBC: 5.1 10*3/uL (ref 4.0–10.5)
nRBC: 0 % (ref 0.0–0.2)

## 2019-10-06 LAB — BASIC METABOLIC PANEL
Anion gap: 10 (ref 5–15)
BUN: 17 mg/dL (ref 6–20)
CO2: 19 mmol/L — ABNORMAL LOW (ref 22–32)
Calcium: 9.4 mg/dL (ref 8.9–10.3)
Chloride: 108 mmol/L (ref 98–111)
Creatinine, Ser: 0.77 mg/dL (ref 0.44–1.00)
GFR calc Af Amer: >60 mL/min (ref >60)
GFR calc non Af Amer: >60 mL/min (ref >60)
Glucose, Bld: 82 mg/dL (ref 70–99)
Potassium: 4.1 mmol/L (ref 3.5–5.1)
Sodium: 137 mmol/L (ref 135–145)

## 2019-10-06 LAB — WET PREP, GENITAL
Clue Cells Wet Prep HPF POC: NONE SEEN
Sperm: NONE SEEN
Trich, Wet Prep: NONE SEEN
Yeast Wet Prep HPF POC: NONE SEEN

## 2019-10-06 MED ORDER — IOHEXOL 300 MG/ML  SOLN
100.0000 mL | Freq: Once | INTRAMUSCULAR | Status: AC | PRN
  Administered 2019-10-06: 100 mL via INTRAVENOUS

## 2019-10-06 MED ORDER — CLINDAMYCIN HCL 150 MG PO CAPS: 300.0000 mg | ORAL_CAPSULE | Freq: Four times a day (QID) | ORAL | 0 refills | Status: AC

## 2019-10-06 NOTE — ED Provider Notes (Signed)
[facility] EMERGENCY DEPARTMENT Provider Note   CSN: [id number] Arrival date & time: 10/06/19  1546     History Chief Complaint  Patient presents with  . Wound Check  . Vaginal Discharge    Linda Thomas is a 39 y.o. female history of Bell's palsy, hyperglycemia, hypertension, migraine, obesity.  Patient presents today for concern of surgical site infection, she had hysterectomy performed on September 03, 2019, she reports that she had been healing well however over the last 3 days she has noticed a small amount of drainage from her surgical site, she describes as a thin clear liquid, she has been putting tissues over the area, no associated pain.  Patient also reports increased vaginal discharge over the past 3 days, she describes as a thin brown discharge which is new for her, no associated pain.  Denies fevers/chills, headache, chest pain/shortness of breath, abdominal pain, nausea/vomiting, diarrhea, dysuria/hematuria, vaginal bleeding, concern for STD or any additional concerns.  HPI     Past Medical History:  Diagnosis Date  . Bell's palsy   . Hyperglycemia    A1c 6.4% 08/30/19  . Hypertension during pregnancy  . Hypertension   . Idiopathic intracranial hypertension   . Migraine     Patient Active Problem List   Diagnosis Date Noted  . S/P TAH (total abdominal hysterectomy) 09/03/2019  . BMI 40.0-44.9, adult ([place]) 04/01/2019  . Essential hypertension 06/21/2016    Past Surgical History:  Procedure Laterality Date  . CESAREAN SECTION     x2  . HYSTERECTOMY ABDOMINAL WITH SALPINGECTOMY Bilateral 09/03/2019   Procedure: HYSTERECTOMY ABDOMINAL WITH BILATERAL SALPINGECTOMY;  Surgeon: Osborne Oman, MD;  Location: [facility];  Service: Gynecology;  Laterality: Bilateral;  . LAPAROSCOPY N/A 09/03/2019   Procedure: Laparoscopy Diagnostic;  Surgeon: Osborne Oman, MD;  Location: [facility];  Service: Gynecology;  Laterality: N/A;  . TUBAL  LIGATION       OB History    Gravida  2   Para  2   Term  2   Preterm      AB      Living        SAB      TAB      Ectopic      Multiple      Live Births              Family History  Problem Relation Age of Onset  . Lung cancer Father   . Bleeding Disorder Neg Hx   . Pseudotumor cerebri Neg Hx     Social History   Tobacco Use  . Smoking status: Former Smoker    Quit date: 2014    Years since quitting: 7.3  . Smokeless tobacco: Never Used  Substance Use Topics  . Alcohol use: Yes    Comment: social  . Drug use: No    Home Medications Prior to Admission medications   Medication Sig Start Date End Date Taking? Authorizing Provider  acetaZOLAMIDE (DIAMOX) 250 MG tablet Take 1 tablet (250 mg total) by mouth 2 (two) times daily. 07/31/19   Melvenia Beam, MD  clindamycin (CLEOCIN) 150 MG capsule Take 2 capsules (300 mg total) by mouth 4 (four) times daily for 7 days. 10/06/19 10/13/19  Nuala Alpha A, PA-C  ibuprofen (ADVIL) 800 MG tablet Take 1 tablet (800 mg total) by mouth 3 (three) times daily with meals as needed for headache, mild pain, moderate pain or cramping. 09/05/19   Anyanwu,  Sallyanne Havers, MD  lisinopril (ZESTRIL) 5 MG tablet Take 1 tablet (5 mg total) by mouth daily. 04/24/19   Sloan Leiter, MD  promethazine (PHENERGAN) 25 MG tablet Take 1 tablet (25 mg total) by mouth every 6 (six) hours as needed for nausea or vomiting. Patient not taking: Reported on 09/20/2019 09/06/19   Anyanwu, Sallyanne Havers, MD  senna-docusate (SENOKOT-S) 8.6-50 MG tablet Take 1 tablet by mouth at bedtime as needed for mild constipation or moderate constipation. Patient not taking: Reported on 09/20/2019 09/05/19   Osborne Oman, MD    Allergies    Other and Penicillins  Review of Systems   Review of Systems Ten systems are reviewed and are negative for acute change except as noted in the HPI  Physical Exam Updated Vital Signs BP (!) 141/78 (BP Location: Right Arm)    Pulse 84   Temp 98.6 F (37 C) (Oral)   Resp 18   Ht 5\' 3"  (1.6 m)   Wt 98.4 kg   LMP 07/25/2019 (Within Weeks)   SpO2 100%   BMI 38.44 kg/m   Physical Exam Constitutional:      General: She is not in acute distress.    Appearance: Normal appearance. She is well-developed. She is not ill-appearing or diaphoretic.  HENT:     Head: Normocephalic and atraumatic.     Right Ear: External ear normal.     Left Ear: External ear normal.     Nose: Nose normal.  Eyes:     General: Vision grossly intact. Gaze aligned appropriately.     Pupils: Pupils are equal, round, and reactive to light.  Neck:     Trachea: Trachea and phonation normal. No tracheal deviation.  Pulmonary:     Effort: Pulmonary effort is normal. No respiratory distress.  Abdominal:     General: There is no distension.     Palpations: Abdomen is soft.     Tenderness: There is no abdominal tenderness. There is no guarding or rebound.  Genitourinary:    Comments: Exam chaperoned by Inocencio Homes.  Pelvic exam: normal external genitalia without evidence of trauma. VULVA: normal appearing vulva with no masses, tenderness or lesion. VAGINA: normal appearing vagina with normal, no lesions.  Thin brown discharge.  Cervix absent. Wet prep and DNA probe for chlamydia and GC obtained.   ADNEXA: Nontender Uterus absent, no tenderness Musculoskeletal:        General: Normal range of motion.     Cervical back: Normal range of motion.  Skin:    General: Skin is warm and dry.          Comments: Subcentimeter dehiscence of the right most portion of surgical scar with clear serosanguineous drainage.  No surrounding erythema, induration, fluctuance or significant tenderness.  Neurological:     Mental Status: She is alert.     GCS: GCS eye subscore is 4. GCS verbal subscore is 5. GCS motor subscore is 6.     Comments: Speech is clear and goal oriented, follows commands Major Cranial nerves without deficit, no facial droop Moves  extremities without ataxia, coordination intact  Psychiatric:        Behavior: Behavior normal.     ED Results / Procedures / Treatments   Labs (all labs ordered are listed, but only abnormal results are displayed) Labs Reviewed  WET PREP, GENITAL - Abnormal; Notable for the following components:      Result Value   WBC, Wet Prep HPF POC MANY (*)  All other components within normal limits  URINALYSIS, ROUTINE W REFLEX MICROSCOPIC - Abnormal; Notable for the following components:   Hgb urine dipstick SMALL (*)    Leukocytes,Ua MODERATE (*)    All other components within normal limits  CBC WITH DIFFERENTIAL/PLATELET - Abnormal; Notable for the following components:   Hemoglobin 11.4 (*)    MCHC 29.5 (*)    All other components within normal limits  BASIC METABOLIC PANEL - Abnormal; Notable for the following components:   CO2 19 (*)    All other components within normal limits  URINE CULTURE  GC/CHLAMYDIA PROBE AMP ([place]) NOT AT [facility]    EKG None  Radiology CT ABDOMEN PELVIS W CONTRAST  Result Date: 10/06/2019 CLINICAL DATA:  Abdominal abscess/infection suspected Hysterectomy 09/03/2019. Patient reports discharge from surgical site over the past 3 days. Vaginal discharge. EXAM: CT ABDOMEN AND PELVIS WITH CONTRAST TECHNIQUE: Multidetector CT imaging of the abdomen and pelvis was performed using the standard protocol following bolus administration of intravenous contrast. CONTRAST:  157mL OMNIPAQUE IOHEXOL 300 MG/ML  SOLN COMPARISON:  None. FINDINGS: Lower chest: The lung bases are clear. Hepatobiliary: No focal liver abnormality is seen. No gallstones, gallbladder wall thickening, or biliary dilatation. Pancreas: No ductal dilatation or inflammation. Spleen: Normal in size without focal abnormality. Adrenals/Urinary Tract: Normal adrenal glands. No hydronephrosis or perinephric edema. Homogeneous renal enhancement with symmetric excretion on delayed phase imaging. Urinary  bladder is partially distended, mild posterior bladder wall thickening. Stomach/Bowel: Stomach partially distended. No small bowel obstruction or inflammation. Scattered small bowel loops is well as colon abut the lower anterior abdominal wall incision site but are noninflamed. Small volume of colonic stool. Normal appendix. Vascular/Lymphatic: Normal caliber abdominal aorta. Patent portal vein. Mesenteric vessels are patent. Few prominent bilateral external iliac and inguinal nodes are likely reactive. Reproductive: Post hysterectomy. No evidence of focal fluid collection or hematoma at the vaginal cuff. Both ovaries are tentatively visualized and normal. No evidence of focal fluid collection. Other: Stranding in the anterior abdominal wall at site of surgical incision. Mild adjacent edema but no subcutaneous fluid collection. Small amount of ill-defined fluid in the dependent pelvis but no evidence of abscess or focal fluid collection. No free air. Musculoskeletal: There are no acute or suspicious osseous abnormalities. IMPRESSION: 1. Recent hysterectomy without evidence of pelvic abscess. Small amount of ill-defined fluid in the pelvis that is non organized. 2. Stranding in the anterior lower abdomen subcutaneous tissues at the surgical incision site. No subcutaneous fluid collection. A few loops of small bowel and colon abuts the incision site but no evidence of bowel obstruction or inflammation. Electronically Signed   By: Keith Rake M.D.   On: 10/06/2019 19:54    Procedures Procedures (including critical care time)  Medications Ordered in ED Medications  iohexol (OMNIPAQUE) 300 MG/ML solution 100 mL (100 mLs Intravenous Contrast Given 10/06/19 1914)    ED Course  I have reviewed the triage vital signs and the nursing notes.  Pertinent labs & imaging results that were available during my care of the patient were reviewed by me and considered in my medical decision making (see chart for  details).    MDM Rules/Calculators/A&P                     I have ordered, reviewed and interpreted the following labs.  CBC shows mild anemia of 11.4 which appears baseline, no leukocytosis to suggest infection.  BMP shows no emergent electrolyte derangement or evidence of  kidney injury.  Urinalysis shows WBCs, RBCs, moderate leukocytes, patient has no urinary symptoms, will send for culture.  Wet prep shows many WBCs, she has recent hysterectomy denies any sexual activity doubt STI at this point may be secondary to healing after hysterectomy.  GC chlamydia pending.  CTAP:  IMPRESSION:  1. Recent hysterectomy without evidence of pelvic abscess. Small  amount of ill-defined fluid in the pelvis that is non organized.  2. Stranding in the anterior lower abdomen subcutaneous tissues at  the surgical incision site. No subcutaneous fluid collection. A few  loops of small bowel and colon abuts the incision site but no  evidence of bowel obstruction or inflammation.  ------------------------ ------------------------ This does not appear to be a fistula, suspect the small amount of serosanguineous drainage is secondary to those skin/subcutaneous tissue healing.  There is no abscess or indication for incision or further work-up at this time.  Patient's vaginal discharge appears consistent with healing from recent hysterectomy, she will follow-up on her GC chlamydia results on her MyChart account and discuss them with her OB/GYN. Discussed case with Dr. Ronnald Nian, will cover patient clindamycin and have her follow-up with her surgeon this week.  At this time there does not appear to be any evidence of an acute emergency medical condition and the patient appears stable for discharge with appropriate outpatient follow up. Diagnosis was discussed with patient who verbalizes understanding of care plan and is agreeable to discharge. I have discussed return precautions with patient who verbalizes  understanding. Patient encouraged to follow-up with their PCP and OBGYN. All questions answered.  Note: Portions of this report may have been transcribed using voice recognition software. Every effort was made to ensure accuracy; however, inadvertent computerized transcription errors may still be present. Final Clinical Impression(s) / ED Diagnoses Final diagnoses:  Visit for wound check  Vaginal discharge    Rx / DC Orders ED Discharge Orders         Ordered    clindamycin (CLEOCIN) 150 MG capsule  4 times daily     10/06/19 2045           Gari Crown 10/06/19 2046    Lennice Sites, DO 10/06/19 2326

## 2019-10-06 NOTE — ED Triage Notes (Signed)
Pt had hysterectomy on 3/30.  Reports discharge from surgical site since Thursday and vaginal discharge since Tuesday.  Denies urinary complaints.

## 2019-10-06 NOTE — Discharge Instructions (Addendum)
You have been diagnosed today with wound check, vaginal discharge.  At this time there does not appear to be the presence of an emergent medical condition, however there is always the potential for conditions to change. Please read and follow the below instructions.  Please return to the Emergency Department immediately for any new or worsening symptoms. Please be sure to follow up with your Primary Care Provider within one week regarding your visit today; please call their office to schedule an appointment even if you are feeling better for a follow-up visit. Please take the antibiotic clindamycin as prescribed to help with possible early infection of your wound site.  Drink enough water to avoid dehydration and get plenty of rest.  Please call your surgeon tomorrow morning to schedule a follow-up appointment for reassessment.  Your gonorrhea and Chlamydia cultures are pending and will result in the next 1-2 days discussed those results with your OB/GYN.  If you have positive gonorrhea and Chlamydia test please inform your sexual partners that they need to be tested and treated as well.  Additionally your urine today showed some bacteria, we have sent this for culture, if it grows out bacteria requiring treatment you will be contacted by [facility] with an antibiotic for coverage.  Get help right away if: You have a red streak going away from your wound. The edges of the wound open up and separate. Your wound is bleeding, and the bleeding does not stop with gentle pressure. You have a rash. You faint. You have trouble breathing. You have abdominal pain, nausea or vomiting You have fever or chills You have any new/concerning or worsening of symptoms  Please read the additional information packets attached to your discharge summary.  Do not take your medicine if  develop an itchy rash, swelling in your mouth or lips, or difficulty breathing; call 911 and seek immediate emergency medical  attention if this occurs.  Note: Portions of this text may have been transcribed using voice recognition software. Every effort was made to ensure accuracy; however, inadvertent computerized transcription errors may still be present.

## 2019-10-07 LAB — URINE CULTURE

## 2019-10-07 LAB — GC/CHLAMYDIA PROBE AMP (~~LOC~~) NOT AT ARMC
Chlamydia: NEGATIVE
Comment: NEGATIVE
Comment: NORMAL
Neisseria Gonorrhea: NEGATIVE

## 2019-10-08 ENCOUNTER — Ambulatory Visit (INDEPENDENT_AMBULATORY_CARE_PROVIDER_SITE_OTHER): Payer: Medicaid Other

## 2019-10-08 ENCOUNTER — Other Ambulatory Visit: Payer: Self-pay

## 2019-10-08 VITALS — BP 147/82 | HR 85 | Wt 218.7 lb

## 2019-10-08 DIAGNOSIS — Z5189 Encounter for other specified aftercare: Secondary | ICD-10-CM

## 2019-10-08 NOTE — Progress Notes (Signed)
Chart reviewed for nurse visit. Agree with plan of care as stated in RN notes.  Mallie Snooks, CNM 10/08/19 4:22 PM

## 2019-10-08 NOTE — Progress Notes (Signed)
Pt here today for incision check s/p abdominal hysterectomy. Pt reports some abdominal pain, improves with ibuprofen. Reports continued foul smelling vaginal discharge; minimal spotting. Per chart review, pt was seen in ED for a wound check. Vaginal swabs collected at that time; negative for all infection but positive for WBCs. Pt currently taking clindamycin.  Incision is clean, dry, and intact with small open area to right edge of incision; open area draining clear discharge. Pt encouraged to continue good wound care and s/s of infection reviewed. Explained pt may place a piece of guaze over the incision to keep the area dry, change frequently. Pt will follow up at post-op visit on 10/17/19.  BP today is 147/82. Hx of essential hypertension. Pt taking Lisinopril 5 mg daily. Encouraged pt to check BP in the next few days to see if it is still elevated. Will review at post-op visit.  Apolonio Schneiders RN 10/08/19

## 2019-10-17 ENCOUNTER — Ambulatory Visit (INDEPENDENT_AMBULATORY_CARE_PROVIDER_SITE_OTHER): Payer: Medicaid Other | Admitting: Obstetrics & Gynecology

## 2019-10-17 ENCOUNTER — Encounter: Payer: Self-pay | Admitting: Obstetrics & Gynecology

## 2019-10-17 ENCOUNTER — Other Ambulatory Visit: Payer: Self-pay

## 2019-10-17 VITALS — BP 137/88 | HR 80 | Ht 63.0 in | Wt 218.0 lb

## 2019-10-17 DIAGNOSIS — Z09 Encounter for follow-up examination after completed treatment for conditions other than malignant neoplasm: Secondary | ICD-10-CM

## 2019-10-17 DIAGNOSIS — Z90710 Acquired absence of both cervix and uterus: Secondary | ICD-10-CM

## 2019-10-17 NOTE — Progress Notes (Signed)
Subjective:     Linda Thomas is a 39 y.o. female who presents to the clinic 5 weeks status post total abdominal hysterectomy and bilateral salpingectomy for abnormal uterine bleeding and fibroids. Eating a regular diet without difficulty. Bowel movements are normal. Pain is controlled with current analgesics. Medications being used: prescription NSAID's including Ibuprofen.  She reports daily spotting from her vagina. No intercourse yet.  The following portions of the patient's history were reviewed and updated as appropriate: allergies, current medications, past family history, past medical history, past social history, past surgical history and problem list.  Review of Systems Pertinent items noted in HPI and remainder of comprehensive ROS otherwise negative.    Objective:    BP 137/88   Pulse 80   Ht 5\' 3"  (1.6 m)   Wt 218 lb (98.9 kg)   LMP 07/25/2019 (Within Weeks)   BMI 38.62 kg/m  General:  alert and no distress  Abdomen: soft, bowel sounds active, non-tender  Incision:   healing well, no drainage, no erythema, no hernia, no seroma, no swelling, no dehiscence, incision well approximated  Pelvic: Intact vaginal cuff, some friable granulation tissue see about 12mm in size. Treated with silver nitrate.     Assessment:    Doing well postoperatively. Operative findings again reviewed. Pathology report discussed.    Plan:    1. Continue any current medications. 2. Wound care discussed. 3. Activity restrictions: pelvic rest for at least 3 more weeks 4. Anticipated return to work: not applicable. 5. Follow up if bleeding continues after about a month from now or gets heavier or more concerning.     Verita Schneiders, MD, [place] for [employer], [place]

## 2019-11-27 ENCOUNTER — Other Ambulatory Visit: Payer: Self-pay | Admitting: *Deleted

## 2019-11-27 MED ORDER — LISINOPRIL 5 MG PO TABS: 5.0000 mg | ORAL_TABLET | Freq: Every day | ORAL | 1 refills | Status: DC

## 2019-11-27 NOTE — Telephone Encounter (Signed)
Received refill fax for Lisinopril. Patient is now postpartum. Will route to provider for review. Kalev Temme,RN

## 2020-01-01 DIAGNOSIS — I119 Hypertensive heart disease without heart failure: Secondary | ICD-10-CM | POA: Diagnosis not present

## 2020-01-01 DIAGNOSIS — Z90710 Acquired absence of both cervix and uterus: Secondary | ICD-10-CM | POA: Diagnosis not present

## 2020-01-01 DIAGNOSIS — Z0000 Encounter for general adult medical examination without abnormal findings: Secondary | ICD-10-CM | POA: Diagnosis not present

## 2020-01-01 DIAGNOSIS — Z6838 Body mass index (BMI) 38.0-38.9, adult: Secondary | ICD-10-CM | POA: Diagnosis not present

## 2020-01-01 DIAGNOSIS — G932 Benign intracranial hypertension: Secondary | ICD-10-CM | POA: Diagnosis not present

## 2020-01-22 DIAGNOSIS — I119 Hypertensive heart disease without heart failure: Secondary | ICD-10-CM | POA: Diagnosis not present

## 2020-03-18 DIAGNOSIS — Z23 Encounter for immunization: Secondary | ICD-10-CM | POA: Diagnosis not present

## 2020-03-18 DIAGNOSIS — G932 Benign intracranial hypertension: Secondary | ICD-10-CM | POA: Diagnosis not present

## 2020-03-18 DIAGNOSIS — I119 Hypertensive heart disease without heart failure: Secondary | ICD-10-CM | POA: Diagnosis not present

## 2020-03-18 DIAGNOSIS — R7301 Impaired fasting glucose: Secondary | ICD-10-CM | POA: Diagnosis not present

## 2020-05-08 ENCOUNTER — Encounter: Payer: Self-pay | Admitting: General Practice

## 2020-05-15 ENCOUNTER — Other Ambulatory Visit: Payer: Self-pay | Admitting: Internal Medicine

## 2020-05-15 DIAGNOSIS — Z1231 Encounter for screening mammogram for malignant neoplasm of breast: Secondary | ICD-10-CM

## 2020-06-26 ENCOUNTER — Ambulatory Visit: Payer: Medicaid Other

## 2020-08-07 ENCOUNTER — Ambulatory Visit: Payer: Medicaid Other

## 2020-08-31 ENCOUNTER — Telehealth: Payer: Self-pay | Admitting: *Deleted

## 2020-08-31 NOTE — Telephone Encounter (Signed)
Pt left VM message stating that she has had vaginal bleeding which started last night and continuing into today. She had a hysterectomy one year ago.  Please call back

## 2020-08-31 NOTE — Telephone Encounter (Signed)
Spoke with pt. Pt states having bright red spotting x 2 after wiping after intercourse last night. Pt states also started back doing exercise/situps. Pt denies any spotting or vaginal bleeding, abd cramps or clots today.  Pt s/p hysterectomy 09/2019.  Pt advised to monitor bleeding at this time after intercours and to reduce vigorous exercise and to have appt with provider if happens again or continues. Pt advised heavy bleeding protocol and when to go to ER. Pt agreeable and verbalized understanding.  Pt will call and make annual exam in April/ May 2022.  Colletta [place], RN  08/31/20.

## 2020-09-30 ENCOUNTER — Ambulatory Visit: Payer: Medicaid Other | Admitting: Certified Nurse Midwife

## 2020-10-28 ENCOUNTER — Institutional Professional Consult (permissible substitution): Payer: Medicaid Other | Admitting: Neurology

## 2020-12-03 ENCOUNTER — Emergency Department (HOSPITAL_COMMUNITY)
Admission: EM | Admit: 2020-12-03 | Discharge: 2020-12-03 | Disposition: A | Discharge status: Home / Self Care | Payer: Medicaid Other | Attending: Emergency Medicine | Admitting: Emergency Medicine

## 2020-12-03 DIAGNOSIS — M79602 Pain in left arm: Secondary | ICD-10-CM | POA: Diagnosis not present

## 2020-12-03 DIAGNOSIS — Z5321 Procedure and treatment not carried out due to patient leaving prior to being seen by health care provider: Secondary | ICD-10-CM | POA: Insufficient documentation

## 2020-12-03 DIAGNOSIS — R202 Paresthesia of skin: Secondary | ICD-10-CM | POA: Insufficient documentation

## 2020-12-03 NOTE — ED Notes (Signed)
Patient not responding to room call

## 2020-12-03 NOTE — ED Triage Notes (Signed)
C/o dull pain, numbness in L arm x "couple days." Denies injury, states covid vaccine in same arm approx 1wk ago.

## 2021-01-11 DIAGNOSIS — Z1231 Encounter for screening mammogram for malignant neoplasm of breast: Secondary | ICD-10-CM | POA: Diagnosis not present

## 2021-03-01 DIAGNOSIS — Z111 Encounter for screening for respiratory tuberculosis: Secondary | ICD-10-CM | POA: Diagnosis not present

## 2021-05-18 DIAGNOSIS — Z6841 Body Mass Index (BMI) 40.0 and over, adult: Secondary | ICD-10-CM | POA: Diagnosis not present

## 2021-05-18 DIAGNOSIS — R600 Localized edema: Secondary | ICD-10-CM | POA: Diagnosis not present

## 2021-05-18 DIAGNOSIS — M25541 Pain in joints of right hand: Secondary | ICD-10-CM | POA: Diagnosis not present

## 2021-05-18 DIAGNOSIS — E782 Mixed hyperlipidemia: Secondary | ICD-10-CM | POA: Diagnosis not present

## 2021-05-18 DIAGNOSIS — R7301 Impaired fasting glucose: Secondary | ICD-10-CM | POA: Diagnosis not present

## 2021-05-18 DIAGNOSIS — R21 Rash and other nonspecific skin eruption: Secondary | ICD-10-CM | POA: Diagnosis not present

## 2021-05-18 DIAGNOSIS — I119 Hypertensive heart disease without heart failure: Secondary | ICD-10-CM | POA: Diagnosis not present

## 2021-05-18 DIAGNOSIS — M25542 Pain in joints of left hand: Secondary | ICD-10-CM | POA: Diagnosis not present

## 2021-06-09 DIAGNOSIS — G5603 Carpal tunnel syndrome, bilateral upper limbs: Secondary | ICD-10-CM | POA: Diagnosis not present

## 2021-06-09 DIAGNOSIS — G629 Polyneuropathy, unspecified: Secondary | ICD-10-CM | POA: Diagnosis not present

## 2021-06-16 DIAGNOSIS — G4733 Obstructive sleep apnea (adult) (pediatric): Secondary | ICD-10-CM | POA: Diagnosis not present

## 2021-06-16 DIAGNOSIS — R059 Cough, unspecified: Secondary | ICD-10-CM | POA: Diagnosis not present

## 2021-07-12 DIAGNOSIS — G4733 Obstructive sleep apnea (adult) (pediatric): Secondary | ICD-10-CM | POA: Diagnosis not present

## 2021-08-09 DIAGNOSIS — G4733 Obstructive sleep apnea (adult) (pediatric): Secondary | ICD-10-CM | POA: Diagnosis not present

## 2021-08-11 DIAGNOSIS — I119 Hypertensive heart disease without heart failure: Secondary | ICD-10-CM | POA: Diagnosis not present

## 2021-08-11 DIAGNOSIS — G4730 Sleep apnea, unspecified: Secondary | ICD-10-CM | POA: Diagnosis not present

## 2021-08-27 ENCOUNTER — Emergency Department (HOSPITAL_COMMUNITY)
Admission: EM | Admit: 2021-08-27 | Discharge: 2021-08-27 | Disposition: A | Discharge status: Home / Self Care | Payer: Medicaid Other | Attending: Emergency Medicine | Admitting: Emergency Medicine

## 2021-08-27 ENCOUNTER — Emergency Department (HOSPITAL_COMMUNITY): Payer: Medicaid Other

## 2021-08-27 DIAGNOSIS — R600 Localized edema: Secondary | ICD-10-CM | POA: Diagnosis not present

## 2021-08-27 DIAGNOSIS — R0602 Shortness of breath: Secondary | ICD-10-CM | POA: Diagnosis not present

## 2021-08-27 DIAGNOSIS — R609 Edema, unspecified: Secondary | ICD-10-CM | POA: Diagnosis not present

## 2021-08-27 LAB — BASIC METABOLIC PANEL
Anion gap: 7 (ref 5–15)
BUN: 17 mg/dL (ref 6–20)
CO2: 23 mmol/L (ref 22–32)
Calcium: 9.1 mg/dL (ref 8.9–10.3)
Chloride: 108 mmol/L (ref 98–111)
Creatinine, Ser: 0.61 mg/dL (ref 0.44–1.00)
GFR, Estimated: >60 mL/min (ref >60)
Glucose, Bld: 109 mg/dL — ABNORMAL HIGH (ref 70–99)
Potassium: 3.8 mmol/L (ref 3.5–5.1)
Sodium: 138 mmol/L (ref 135–145)

## 2021-08-27 LAB — BRAIN NATRIURETIC PEPTIDE: B Natriuretic Peptide: 18.6 pg/mL (ref 0.0–100.0)

## 2021-08-27 LAB — I-STAT BETA HCG BLOOD, ED (MC, WL, AP ONLY): I-stat hCG, quantitative: <5 m[IU]/mL (ref <5)

## 2021-08-27 LAB — CBC
HCT: 36.8 % (ref 36.0–46.0)
Hemoglobin: 11.2 g/dL — ABNORMAL LOW (ref 12.0–15.0)
MCH: 26.5 pg (ref 26.0–34.0)
MCHC: 30.4 g/dL (ref 30.0–36.0)
MCV: 87.2 fL (ref 80.0–100.0)
Platelets: 207 10*3/uL (ref 150–400)
RBC: 4.22 MIL/uL (ref 3.87–5.11)
RDW: 14.6 % (ref 11.5–15.5)
WBC: 5.4 10*3/uL (ref 4.0–10.5)
nRBC: 0 % (ref 0.0–0.2)

## 2021-08-27 MED ORDER — FUROSEMIDE 20 MG PO TABS: 20.0000 mg | ORAL_TABLET | Freq: Two times a day (BID) | ORAL | 0 refills | Status: DC

## 2021-08-27 NOTE — ED Triage Notes (Signed)
Pt arrives to ED POV c/o Bilateral leg swelling. Pt states she noticed the swelling today. Denies SHOB. Hx of HTN. No other complaints at this time.  ?

## 2021-08-27 NOTE — ED Provider Triage Note (Signed)
?  Emergency Medicine Provider Triage Evaluation Note ? ?MRN:  [id number]  ?Arrival date & time: 08/27/21    ?Medically screening exam initiated at 1:28 AM.   ?CC:   ?No chief complaint on file. ?  ?HPI:  ?Linda Thomas is a 41 y.o. year-old female presents to the ED with chief complaint of extremity swelling with some occasional shortness of breath.  Reports history of significant high blood pressure.  Has had recent medication changes.  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  She denies hx of CHF. ? ?History provided by History provided by patient. ?ROS:  ?-As included in HPI ?PE:  ? ?Vitals:  ? 08/27/21 0106  ?BP: (!) 178/103  ?Pulse: 85  ?Resp: 15  ?Temp: 97.9 ?F (36.6 ?C)  ?SpO2: 96%  ?  ?Non-toxic appearing ?No respiratory distress ?1+ pitting edema in BLE ?MDM:  ?Based on signs and symptoms, new onset CHF is highest on my differential. ?I've ordered labs in triage to expedite lab/diagnostic workup. ? ?Patient was informed that the remainder of the evaluation will be completed by another provider, this initial triage assessment does not replace that evaluation, and the importance of remaining in the ED until their evaluation is complete. ? ?  ?Montine Circle, PA-C ?08/27/21 0131 ? ?

## 2021-08-27 NOTE — ED Provider Notes (Signed)
?[place] DEPT ?[facility] ?Provider Note ?MRN:  [id number]  ?Arrival date & time: 08/27/21    ? ?Chief Complaint   ?Leg Swelling ?  ?History of Present Illness   ?Linda Thomas is a 41 y.o. year-old female presents to the ED with chief complaint of bilateral lower extremity swelling.  She also states that she has noticed some swelling of her arms.  She states that she has known high blood pressure and is working with her doctor to adjust her medications.  She denies travel, hx of PE, cough, or fever.  Denies any significant SOB.  Denies hx of CHF. ? ?History provided by patient. ? ? ?Review of Systems  ?Pertinent review of systems noted in HPI.  ? ? ?Physical Exam  ? ?Vitals:  ? 08/27/21 0106  ?BP: (!) 178/103  ?Pulse: 85  ?Resp: 15  ?Temp: 97.9 ?F (36.6 ?C)  ?SpO2: 96%  ?  ?CONSTITUTIONAL:  well-appearing, NAD ?NEURO:  Alert and oriented x 3, CN 3-12 grossly intact ?EYES:  eyes equal and reactive ?ENT/NECK:  Supple, no stridor  ?CARDIO:  normal rate, regular rhythm, appears well-perfused  ?PULM:  No respiratory distress, CTAB ?GI/GU:  non-distended,  ?MSK/SPINE:  No gross deformities, 1+ pitting edema in BLE, moves all extremities  ?SKIN:  no rash, atraumatic ? ? ?*Additional and/or pertinent findings included in MDM below ? ?Diagnostic and Interventional Summary  ? ? EKG Interpretation ? ?Date/Time:  Friday August 27 2021 01:53:06 EDT ?Ventricular Rate:  78 ?PR Interval:  174 ?QRS Duration: 86 ?QT Interval:  418 ?QTC Calculation: 476 ?R Axis:   22 ?Text Interpretation: Normal sinus rhythm Low voltage QRS PREVIOUS ECG IS PRESENT Confirmed by Dory Horn) on 08/27/2021 3:02:14 AM ?  ? ?  ? ?Labs Reviewed  ?BASIC METABOLIC PANEL - Abnormal; Notable for the following components:  ?    Result Value  ? Glucose, Bld 109 (*)   ? All other components within normal limits  ?CBC - Abnormal; Notable for the following components:  ? Hemoglobin 11.2 (*)   ? All other  components within normal limits  ?BRAIN NATRIURETIC PEPTIDE  ?I-STAT BETA HCG BLOOD, ED ([facility], [facility], [facility] ONLY)  ?  ?DG Chest Portable 1 View  ?Final Result  ?  ?  ?Medications - No data to display  ? ?Procedures  /  Critical Care ?Procedures ? ?ED Course and Medical Decision Making  ?I have reviewed the triage vital signs, the nursing notes, and pertinent available records from the EMR. ? ?Complexity of Problems Addressed: ?Moderate Complexity: Acute illness with systemic symptoms, requiring diagnostic workup to rule out more severe disease. ?Comorbidities affecting this illness/injury include: ?HTN ?Social Determinants Affecting Care: ?No clinically significant social determinants affecting this chief complaint.. ? ? ?ED Course: ?After considering the following differential, new onset CHF, dependant edema, I ordered labs and CXR. ?I personally interpreted the labs which are notable for normal BNP. ?I visualized the chest x-ray which is notable for no sign of volume overload and agree with the radiologist interpretation.. ? ?  ? ?Consultants: ?No consultations were needed in caring for this patient. ? ?Treatment and Plan: ?Due to reassuring labs and imaging, I don't think the patient needs further emergent workup tonight.  I've advised her to follow-up closely with her doctor, use compression stockings, and have given her a few tablets of lasix.  She will continue to work on BP control. ? ?Emergency department workup does not suggest an emergent condition requiring  admission or immediate intervention beyond  what has been performed at this time. The patient is safe for discharge and has  been instructed to return immediately for worsening symptoms, change in  symptoms or any other concerns ? ? ? ?Final Clinical Impressions(s) / ED Diagnoses  ? ?  ICD-10-CM   ?1. Peripheral edema  R60.9   ?  ?  ?ED Discharge Orders   ? ?      Ordered  ?  furosemide (LASIX) 20 MG tablet  2 times daily       ? 08/27/21 0408  ? ?  ?  ? ?  ?   ? ? ?Discharge Instructions Discussed with and Provided to Patient:  ? ? ?Discharge Instructions   ? ?  ?I recommend that you get some compression stockings.  ? ?You need to follow-up with your doctor.  Reduce your salt intake.  Keep working on controlling your blood pressure. ? ? ? ?  ?Montine Circle, PA-C ?08/27/21 5859 ? ?  ?Palumbo, April, MD ?08/27/21 0532 ? ?

## 2021-08-27 NOTE — Discharge Instructions (Signed)
I recommend that you get some compression stockings.  ? ?You need to follow-up with your doctor.  Reduce your salt intake.  Keep working on controlling your blood pressure. ?

## 2021-08-27 NOTE — ED Notes (Signed)
RN reviewed discharge instructions with pt. Pt verbalized understanding and had no further questions. VSS upon discharge.  

## 2021-08-30 ENCOUNTER — Telehealth: Payer: Self-pay

## 2021-08-30 NOTE — Telephone Encounter (Signed)
Transition Care Management Unsuccessful Follow-up Telephone Call ? ?Date of discharge and from where:  08/27/2021 from [facility] ? ?Attempts:  1st Attempt ? ?Reason for unsuccessful TCM follow-up call:  Left voice message ? ? ? ?

## 2021-09-01 NOTE — Telephone Encounter (Signed)
Transition Care Management Unsuccessful Follow-up Telephone Call ? ?Date of discharge and from where:  08/27/2021 from [facility] ? ?Attempts:  2nd Attempt ? ?Reason for unsuccessful TCM follow-up call:  Left voice message ? ? ? ?

## 2021-09-02 NOTE — Telephone Encounter (Signed)
Transition Care Management Unsuccessful Follow-up Telephone Call ? ?Date of discharge and from where:  08/27/2021 from [facility] ? ?Attempts:  3rd Attempt ? ?Reason for unsuccessful TCM follow-up call:  Unable to reach patient ? ? ? ?

## 2021-09-09 DIAGNOSIS — G4733 Obstructive sleep apnea (adult) (pediatric): Secondary | ICD-10-CM | POA: Diagnosis not present

## 2021-10-09 DIAGNOSIS — G4733 Obstructive sleep apnea (adult) (pediatric): Secondary | ICD-10-CM | POA: Diagnosis not present

## 2021-10-18 DIAGNOSIS — I119 Hypertensive heart disease without heart failure: Secondary | ICD-10-CM | POA: Diagnosis not present

## 2021-10-18 DIAGNOSIS — Z6841 Body Mass Index (BMI) 40.0 and over, adult: Secondary | ICD-10-CM | POA: Diagnosis not present

## 2021-10-18 DIAGNOSIS — D649 Anemia, unspecified: Secondary | ICD-10-CM | POA: Diagnosis not present

## 2021-10-18 DIAGNOSIS — G4733 Obstructive sleep apnea (adult) (pediatric): Secondary | ICD-10-CM | POA: Diagnosis not present

## 2021-10-18 DIAGNOSIS — G4730 Sleep apnea, unspecified: Secondary | ICD-10-CM | POA: Diagnosis not present

## 2021-10-20 DIAGNOSIS — G4733 Obstructive sleep apnea (adult) (pediatric): Secondary | ICD-10-CM | POA: Diagnosis not present

## 2021-11-09 DIAGNOSIS — G4733 Obstructive sleep apnea (adult) (pediatric): Secondary | ICD-10-CM | POA: Diagnosis not present

## 2021-11-22 DIAGNOSIS — I119 Hypertensive heart disease without heart failure: Secondary | ICD-10-CM | POA: Diagnosis not present

## 2021-12-09 DIAGNOSIS — G4733 Obstructive sleep apnea (adult) (pediatric): Secondary | ICD-10-CM | POA: Diagnosis not present

## 2022-03-25 DIAGNOSIS — G4733 Obstructive sleep apnea (adult) (pediatric): Secondary | ICD-10-CM | POA: Diagnosis not present

## 2022-03-25 DIAGNOSIS — Z6841 Body Mass Index (BMI) 40.0 and over, adult: Secondary | ICD-10-CM | POA: Diagnosis not present

## 2022-03-25 DIAGNOSIS — I119 Hypertensive heart disease without heart failure: Secondary | ICD-10-CM | POA: Diagnosis not present

## 2022-03-25 DIAGNOSIS — Z0001 Encounter for general adult medical examination with abnormal findings: Secondary | ICD-10-CM | POA: Diagnosis not present

## 2022-03-29 ENCOUNTER — Other Ambulatory Visit: Payer: Self-pay | Admitting: Internal Medicine

## 2022-03-29 DIAGNOSIS — Z1231 Encounter for screening mammogram for malignant neoplasm of breast: Secondary | ICD-10-CM

## 2022-08-01 DIAGNOSIS — G4733 Obstructive sleep apnea (adult) (pediatric): Secondary | ICD-10-CM | POA: Diagnosis not present

## 2022-08-01 DIAGNOSIS — R7303 Prediabetes: Secondary | ICD-10-CM | POA: Diagnosis not present

## 2022-08-01 DIAGNOSIS — Z6841 Body Mass Index (BMI) 40.0 and over, adult: Secondary | ICD-10-CM | POA: Diagnosis not present

## 2022-08-01 DIAGNOSIS — I119 Hypertensive heart disease without heart failure: Secondary | ICD-10-CM | POA: Diagnosis not present

## 2022-08-30 DIAGNOSIS — I119 Hypertensive heart disease without heart failure: Secondary | ICD-10-CM | POA: Diagnosis not present

## 2022-09-21 ENCOUNTER — Emergency Department (HOSPITAL_COMMUNITY): Payer: Medicaid Other | Admitting: Anesthesiology

## 2022-09-21 ENCOUNTER — Emergency Department (HOSPITAL_COMMUNITY): Payer: Medicaid Other

## 2022-09-21 ENCOUNTER — Encounter (HOSPITAL_COMMUNITY): Admission: EM | Disposition: A | Discharge status: Home / Self Care | Payer: Self-pay | Source: Home / Self Care

## 2022-09-21 ENCOUNTER — Encounter (HOSPITAL_COMMUNITY): Payer: Self-pay | Admitting: Emergency Medicine

## 2022-09-21 ENCOUNTER — Other Ambulatory Visit: Payer: Self-pay

## 2022-09-21 ENCOUNTER — Inpatient Hospital Stay (HOSPITAL_COMMUNITY)
Admission: EM | Admit: 2022-09-21 | Discharge: 2022-09-26 | DRG: 354 | Disposition: A | Discharge status: Home / Self Care | Payer: Medicaid Other | Attending: Surgery | Admitting: Surgery

## 2022-09-21 DIAGNOSIS — K460 Unspecified abdominal hernia with obstruction, without gangrene: Secondary | ICD-10-CM | POA: Diagnosis present

## 2022-09-21 DIAGNOSIS — K567 Ileus, unspecified: Secondary | ICD-10-CM | POA: Diagnosis not present

## 2022-09-21 DIAGNOSIS — K9189 Other postprocedural complications and disorders of digestive system: Secondary | ICD-10-CM | POA: Diagnosis not present

## 2022-09-21 DIAGNOSIS — I10 Essential (primary) hypertension: Secondary | ICD-10-CM

## 2022-09-21 DIAGNOSIS — R109 Unspecified abdominal pain: Secondary | ICD-10-CM | POA: Diagnosis not present

## 2022-09-21 DIAGNOSIS — E785 Hyperlipidemia, unspecified: Secondary | ICD-10-CM | POA: Diagnosis present

## 2022-09-21 DIAGNOSIS — E119 Type 2 diabetes mellitus without complications: Secondary | ICD-10-CM | POA: Diagnosis not present

## 2022-09-21 DIAGNOSIS — Z79899 Other long term (current) drug therapy: Secondary | ICD-10-CM

## 2022-09-21 DIAGNOSIS — K430 Incisional hernia with obstruction, without gangrene: Secondary | ICD-10-CM | POA: Diagnosis not present

## 2022-09-21 DIAGNOSIS — Z87891 Personal history of nicotine dependence: Secondary | ICD-10-CM

## 2022-09-21 DIAGNOSIS — G510 Bell's palsy: Secondary | ICD-10-CM | POA: Diagnosis present

## 2022-09-21 DIAGNOSIS — G43909 Migraine, unspecified, not intractable, without status migrainosus: Secondary | ICD-10-CM | POA: Diagnosis present

## 2022-09-21 DIAGNOSIS — K660 Peritoneal adhesions (postprocedural) (postinfection): Secondary | ICD-10-CM | POA: Diagnosis not present

## 2022-09-21 DIAGNOSIS — K469 Unspecified abdominal hernia without obstruction or gangrene: Secondary | ICD-10-CM | POA: Diagnosis not present

## 2022-09-21 DIAGNOSIS — R1031 Right lower quadrant pain: Secondary | ICD-10-CM | POA: Diagnosis not present

## 2022-09-21 DIAGNOSIS — Z880 Allergy status to penicillin: Secondary | ICD-10-CM

## 2022-09-21 HISTORY — PX: INCISIONAL HERNIA REPAIR: SHX193

## 2022-09-21 LAB — COMPREHENSIVE METABOLIC PANEL
ALT: 23 U/L (ref 0–44)
AST: 21 U/L (ref 15–41)
Albumin: 3.8 g/dL (ref 3.5–5.0)
Alkaline Phosphatase: 83 U/L (ref 38–126)
Anion gap: 6 (ref 5–15)
BUN: 15 mg/dL (ref 6–20)
CO2: 23 mmol/L (ref 22–32)
Calcium: 8.7 mg/dL — ABNORMAL LOW (ref 8.9–10.3)
Chloride: 106 mmol/L (ref 98–111)
Creatinine, Ser: 0.85 mg/dL (ref 0.44–1.00)
GFR, Estimated: >60 mL/min (ref >60)
Glucose, Bld: 112 mg/dL — ABNORMAL HIGH (ref 70–99)
Potassium: 3.5 mmol/L (ref 3.5–5.1)
Sodium: 135 mmol/L (ref 135–145)
Total Bilirubin: 0.2 mg/dL — ABNORMAL LOW (ref 0.3–1.2)
Total Protein: 7.4 g/dL (ref 6.5–8.1)

## 2022-09-21 LAB — CBC
HCT: 38.6 % (ref 36.0–46.0)
Hemoglobin: 12.3 g/dL (ref 12.0–15.0)
MCH: 27 pg (ref 26.0–34.0)
MCHC: 31.9 g/dL (ref 30.0–36.0)
MCV: 84.8 fL (ref 80.0–100.0)
Platelets: 212 10*3/uL (ref 150–400)
RBC: 4.55 MIL/uL (ref 3.87–5.11)
RDW: 13.5 % (ref 11.5–15.5)
WBC: 10.4 10*3/uL (ref 4.0–10.5)
nRBC: 0 % (ref 0.0–0.2)

## 2022-09-21 LAB — HEMOGLOBIN A1C
Hgb A1c MFr Bld: 6.3 % — ABNORMAL HIGH (ref 4.8–5.6)
Mean Plasma Glucose: 134.11 mg/dL

## 2022-09-21 LAB — HIV ANTIBODY (ROUTINE TESTING W REFLEX): HIV Screen 4th Generation wRfx: NONREACTIVE

## 2022-09-21 LAB — CBC WITH DIFFERENTIAL/PLATELET
Abs Immature Granulocytes: 0.02 10*3/uL (ref 0.00–0.07)
Basophils Absolute: 0 10*3/uL (ref 0.0–0.1)
Basophils Relative: 0 %
Eosinophils Absolute: 0.1 10*3/uL (ref 0.0–0.5)
Eosinophils Relative: 2 %
HCT: 37.8 % (ref 36.0–46.0)
Hemoglobin: 11.9 g/dL — ABNORMAL LOW (ref 12.0–15.0)
Immature Granulocytes: 0 %
Lymphocytes Relative: 43 %
Lymphs Abs: 2.5 10*3/uL (ref 0.7–4.0)
MCH: 26.6 pg (ref 26.0–34.0)
MCHC: 31.5 g/dL (ref 30.0–36.0)
MCV: 84.6 fL (ref 80.0–100.0)
Monocytes Absolute: 0.6 10*3/uL (ref 0.1–1.0)
Monocytes Relative: 10 %
Neutro Abs: 2.6 10*3/uL (ref 1.7–7.7)
Neutrophils Relative %: 45 %
Platelets: 218 10*3/uL (ref 150–400)
RBC: 4.47 MIL/uL (ref 3.87–5.11)
RDW: 13.5 % (ref 11.5–15.5)
WBC: 5.9 10*3/uL (ref 4.0–10.5)
nRBC: 0 % (ref 0.0–0.2)

## 2022-09-21 LAB — GLUCOSE, CAPILLARY
Glucose-Capillary: 93 mg/dL (ref 70–99)
Glucose-Capillary: 135 mg/dL — ABNORMAL HIGH (ref 70–99)

## 2022-09-21 LAB — URINALYSIS, ROUTINE W REFLEX MICROSCOPIC
Bilirubin Urine: NEGATIVE
Glucose, UA: NEGATIVE mg/dL
Hgb urine dipstick: NEGATIVE
Ketones, ur: NEGATIVE mg/dL
Leukocytes,Ua: NEGATIVE
Nitrite: NEGATIVE
Protein, ur: NEGATIVE mg/dL
Specific Gravity, Urine: 1.017 (ref 1.005–1.030)
pH: 7 (ref 5.0–8.0)

## 2022-09-21 LAB — LIPASE, BLOOD: Lipase: 28 U/L (ref 11–51)

## 2022-09-21 LAB — LACTIC ACID, PLASMA
Lactic Acid, Venous: 0.8 mmol/L (ref 0.5–1.9)
Lactic Acid, Venous: 0.6 mmol/L (ref 0.5–1.9)

## 2022-09-21 LAB — CREATININE, SERUM
Creatinine, Ser: 0.86 mg/dL (ref 0.44–1.00)
GFR, Estimated: >60 mL/min (ref >60)

## 2022-09-21 LAB — I-STAT BETA HCG BLOOD, ED (MC, WL, AP ONLY): I-stat hCG, quantitative: <5 m[IU]/mL (ref <5)

## 2022-09-21 SURGERY — REPAIR, HERNIA, INCISIONAL, LAPAROSCOPIC
Anesthesia: General | Site: Abdomen

## 2022-09-21 MED ORDER — MELATONIN 3 MG PO TABS: 3.0000 mg | ORAL_TABLET | Freq: Every evening | ORAL | Status: DC | PRN

## 2022-09-21 MED ORDER — PANTOPRAZOLE SODIUM 40 MG IV SOLR
40.0000 mg | Freq: Every day | INTRAVENOUS | Status: DC
  Administered 2022-09-21 – 2022-09-22 (×2): 40 mg via INTRAVENOUS
  Filled 2022-09-21 (×2): qty 10

## 2022-09-21 MED ORDER — PHENYLEPHRINE 80 MCG/ML (10ML) SYRINGE FOR IV PUSH (FOR BLOOD PRESSURE SUPPORT)
PREFILLED_SYRINGE | INTRAVENOUS | Status: AC
  Filled 2022-09-21: qty 10

## 2022-09-21 MED ORDER — LIDOCAINE 2% (20 MG/ML) 5 ML SYRINGE
INTRAMUSCULAR | Status: DC | PRN
  Administered 2022-09-21: 100 mg via INTRAVENOUS

## 2022-09-21 MED ORDER — KETOROLAC TROMETHAMINE 30 MG/ML IJ SOLN: 30.0000 mg | Freq: Once | INTRAMUSCULAR | Status: DC | PRN

## 2022-09-21 MED ORDER — DOCUSATE SODIUM 100 MG PO CAPS
100.0000 mg | ORAL_CAPSULE | Freq: Two times a day (BID) | ORAL | Status: DC
  Administered 2022-09-22 – 2022-09-26 (×9): 100 mg via ORAL
  Filled 2022-09-21 (×9): qty 1

## 2022-09-21 MED ORDER — ONDANSETRON HCL 4 MG/2ML IJ SOLN: 4.0000 mg | Freq: Once | INTRAMUSCULAR | Status: DC | PRN

## 2022-09-21 MED ORDER — SODIUM CHLORIDE 0.9 % IV SOLN: INTRAVENOUS | Status: DC

## 2022-09-21 MED ORDER — SIMETHICONE 80 MG PO CHEW: 40.0000 mg | CHEWABLE_TABLET | Freq: Four times a day (QID) | ORAL | Status: DC | PRN

## 2022-09-21 MED ORDER — PROPOFOL 10 MG/ML IV BOLUS
INTRAVENOUS | Status: AC
  Filled 2022-09-21: qty 20

## 2022-09-21 MED ORDER — DEXAMETHASONE SODIUM PHOSPHATE 10 MG/ML IJ SOLN
INTRAMUSCULAR | Status: AC
  Filled 2022-09-21: qty 1

## 2022-09-21 MED ORDER — 0.9 % SODIUM CHLORIDE (POUR BTL) OPTIME
TOPICAL | Status: DC | PRN
  Administered 2022-09-21: 1000 mL

## 2022-09-21 MED ORDER — POLYETHYLENE GLYCOL 3350 17 G PO PACK: 17.0000 g | PACK | Freq: Every day | ORAL | Status: DC | PRN

## 2022-09-21 MED ORDER — ONDANSETRON HCL 4 MG/2ML IJ SOLN: 4.0000 mg | Freq: Four times a day (QID) | INTRAMUSCULAR | Status: DC | PRN

## 2022-09-21 MED ORDER — MIDAZOLAM HCL 2 MG/2ML IJ SOLN
INTRAMUSCULAR | Status: AC
  Filled 2022-09-21: qty 2

## 2022-09-21 MED ORDER — CIPROFLOXACIN IN D5W 400 MG/200ML IV SOLN
INTRAVENOUS | Status: AC
  Filled 2022-09-21: qty 200

## 2022-09-21 MED ORDER — METHOCARBAMOL 1000 MG/10ML IJ SOLN: 500.0000 mg | Freq: Three times a day (TID) | INTRAVENOUS | Status: DC | PRN

## 2022-09-21 MED ORDER — KETOROLAC TROMETHAMINE 30 MG/ML IJ SOLN
INTRAMUSCULAR | Status: DC | PRN
  Administered 2022-09-21: 30 mg via INTRAVENOUS

## 2022-09-21 MED ORDER — CIPROFLOXACIN IN D5W 400 MG/200ML IV SOLN
400.0000 mg | INTRAVENOUS | Status: AC
  Administered 2022-09-21: 400 mg via INTRAVENOUS

## 2022-09-21 MED ORDER — CHLORHEXIDINE GLUCONATE CLOTH 2 % EX PADS: 6.0000 | MEDICATED_PAD | Freq: Once | CUTANEOUS | Status: DC

## 2022-09-21 MED ORDER — ACETAMINOPHEN 10 MG/ML IV SOLN
INTRAVENOUS | Status: AC
  Filled 2022-09-21: qty 100

## 2022-09-21 MED ORDER — HYDROMORPHONE HCL 1 MG/ML IJ SOLN
INTRAMUSCULAR | Status: AC
  Filled 2022-09-21: qty 0.5

## 2022-09-21 MED ORDER — LIDOCAINE 2% (20 MG/ML) 5 ML SYRINGE
INTRAMUSCULAR | Status: AC
  Filled 2022-09-21: qty 5

## 2022-09-21 MED ORDER — INSULIN ASPART 100 UNIT/ML IJ SOLN: 0.0000 [IU] | Freq: Three times a day (TID) | INTRAMUSCULAR | Status: DC

## 2022-09-21 MED ORDER — ONDANSETRON HCL 4 MG/2ML IJ SOLN
INTRAMUSCULAR | Status: DC | PRN
  Administered 2022-09-21: 4 mg via INTRAVENOUS

## 2022-09-21 MED ORDER — KETOROLAC TROMETHAMINE 30 MG/ML IJ SOLN
INTRAMUSCULAR | Status: AC
  Filled 2022-09-21: qty 1

## 2022-09-21 MED ORDER — HYDROMORPHONE HCL 1 MG/ML IJ SOLN
0.5000 mg | INTRAMUSCULAR | Status: DC | PRN
  Administered 2022-09-21 – 2022-09-23 (×8): 0.5 mg via INTRAVENOUS
  Filled 2022-09-21 (×8): qty 0.5

## 2022-09-21 MED ORDER — DIPHENHYDRAMINE HCL 25 MG PO CAPS: 25.0000 mg | ORAL_CAPSULE | Freq: Four times a day (QID) | ORAL | Status: DC | PRN

## 2022-09-21 MED ORDER — CHLORHEXIDINE GLUCONATE 0.12 % MT SOLN: 15.0000 mL | OROMUCOSAL | Status: AC

## 2022-09-21 MED ORDER — PROPOFOL 10 MG/ML IV BOLUS
INTRAVENOUS | Status: DC | PRN
  Administered 2022-09-21: 200 mg via INTRAVENOUS

## 2022-09-21 MED ORDER — BUPIVACAINE HCL 0.25 % IJ SOLN
INTRAMUSCULAR | Status: DC | PRN
  Administered 2022-09-21: 30 mL

## 2022-09-21 MED ORDER — OXYCODONE HCL 5 MG PO TABS
5.0000 mg | ORAL_TABLET | ORAL | Status: DC | PRN
  Administered 2022-09-23: 5 mg via ORAL
  Administered 2022-09-24 – 2022-09-26 (×7): 10 mg via ORAL
  Filled 2022-09-21 (×6): qty 2
  Filled 2022-09-21: qty 1
  Filled 2022-09-21 (×2): qty 2

## 2022-09-21 MED ORDER — OXYCODONE HCL 5 MG/5ML PO SOLN: 5.0000 mg | Freq: Once | ORAL | Status: DC | PRN

## 2022-09-21 MED ORDER — METOPROLOL TARTRATE 5 MG/5ML IV SOLN: 5.0000 mg | Freq: Four times a day (QID) | INTRAVENOUS | Status: DC | PRN

## 2022-09-21 MED ORDER — ENOXAPARIN SODIUM 40 MG/0.4ML IJ SOSY
40.0000 mg | PREFILLED_SYRINGE | INTRAMUSCULAR | Status: DC
  Administered 2022-09-22 – 2022-09-25 (×4): 40 mg via SUBCUTANEOUS
  Filled 2022-09-21 (×5): qty 0.4

## 2022-09-21 MED ORDER — CHLORHEXIDINE GLUCONATE 0.12 % MT SOLN
OROMUCOSAL | Status: AC
  Administered 2022-09-21: 15 mL
  Filled 2022-09-21: qty 15

## 2022-09-21 MED ORDER — LOSARTAN POTASSIUM 50 MG PO TABS
100.0000 mg | ORAL_TABLET | Freq: Every day | ORAL | Status: DC
  Administered 2022-09-21 – 2022-09-25 (×5): 100 mg via ORAL
  Filled 2022-09-21 (×6): qty 2

## 2022-09-21 MED ORDER — ROSUVASTATIN CALCIUM 5 MG PO TABS
10.0000 mg | ORAL_TABLET | Freq: Every day | ORAL | Status: DC
  Administered 2022-09-21 – 2022-09-26 (×6): 10 mg via ORAL
  Filled 2022-09-21 (×6): qty 2

## 2022-09-21 MED ORDER — ACETAMINOPHEN 500 MG PO TABS
1000.0000 mg | ORAL_TABLET | Freq: Four times a day (QID) | ORAL | Status: DC
  Administered 2022-09-21 – 2022-09-26 (×17): 1000 mg via ORAL
  Filled 2022-09-21 (×19): qty 2

## 2022-09-21 MED ORDER — ROCURONIUM BROMIDE 10 MG/ML (PF) SYRINGE
PREFILLED_SYRINGE | INTRAVENOUS | Status: AC
  Filled 2022-09-21: qty 10

## 2022-09-21 MED ORDER — OXYCODONE HCL 5 MG PO TABS: 5.0000 mg | ORAL_TABLET | Freq: Once | ORAL | Status: DC | PRN

## 2022-09-21 MED ORDER — BUPIVACAINE HCL (PF) 0.25 % IJ SOLN
INTRAMUSCULAR | Status: AC
  Filled 2022-09-21: qty 30

## 2022-09-21 MED ORDER — IOHEXOL 350 MG/ML SOLN
75.0000 mL | Freq: Once | INTRAVENOUS | Status: AC | PRN
  Administered 2022-09-21: 75 mL via INTRAVENOUS

## 2022-09-21 MED ORDER — PHENYLEPHRINE HCL (PRESSORS) 10 MG/ML IV SOLN
INTRAVENOUS | Status: DC | PRN
  Administered 2022-09-21 (×6): 40 ug via INTRAVENOUS

## 2022-09-21 MED ORDER — ROCURONIUM BROMIDE 10 MG/ML (PF) SYRINGE
PREFILLED_SYRINGE | INTRAVENOUS | Status: DC | PRN
  Administered 2022-09-21: 60 mg via INTRAVENOUS

## 2022-09-21 MED ORDER — ONDANSETRON 4 MG PO TBDP: 4.0000 mg | ORAL_TABLET | Freq: Four times a day (QID) | ORAL | Status: DC | PRN

## 2022-09-21 MED ORDER — DIPHENHYDRAMINE HCL 50 MG/ML IJ SOLN: 25.0000 mg | Freq: Four times a day (QID) | INTRAMUSCULAR | Status: DC | PRN

## 2022-09-21 MED ORDER — MIDAZOLAM HCL 2 MG/2ML IJ SOLN
INTRAMUSCULAR | Status: DC | PRN
  Administered 2022-09-21: 2 mg via INTRAVENOUS

## 2022-09-21 MED ORDER — HYDROMORPHONE HCL 1 MG/ML IJ SOLN: 0.2500 mg | INTRAMUSCULAR | Status: DC | PRN

## 2022-09-21 MED ORDER — FENTANYL CITRATE (PF) 250 MCG/5ML IJ SOLN
INTRAMUSCULAR | Status: AC
  Filled 2022-09-21: qty 5

## 2022-09-21 MED ORDER — LACTATED RINGERS IV SOLN: INTRAVENOUS | Status: DC

## 2022-09-21 MED ORDER — LABETALOL HCL 100 MG PO TABS
100.0000 mg | ORAL_TABLET | Freq: Two times a day (BID) | ORAL | Status: DC
  Administered 2022-09-21 – 2022-09-25 (×9): 100 mg via ORAL
  Filled 2022-09-21 (×10): qty 1

## 2022-09-21 MED ORDER — ACETAMINOPHEN 10 MG/ML IV SOLN
INTRAVENOUS | Status: DC | PRN
  Administered 2022-09-21: 1000 mg via INTRAVENOUS

## 2022-09-21 MED ORDER — DEXAMETHASONE SODIUM PHOSPHATE 10 MG/ML IJ SOLN
INTRAMUSCULAR | Status: DC | PRN
  Administered 2022-09-21: 5 mg via INTRAVENOUS

## 2022-09-21 MED ORDER — SODIUM CHLORIDE 0.9 % IR SOLN
Status: DC | PRN
  Administered 2022-09-21: 1000 mL

## 2022-09-21 MED ORDER — ONDANSETRON HCL 4 MG/2ML IJ SOLN
INTRAMUSCULAR | Status: AC
  Filled 2022-09-21: qty 2

## 2022-09-21 MED ORDER — INSULIN ASPART 100 UNIT/ML IJ SOLN: 0.0000 [IU] | Freq: Every day | INTRAMUSCULAR | Status: DC

## 2022-09-21 MED ORDER — METHOCARBAMOL 500 MG PO TABS: 500.0000 mg | ORAL_TABLET | Freq: Three times a day (TID) | ORAL | Status: DC | PRN

## 2022-09-21 MED ORDER — HYDROMORPHONE HCL 1 MG/ML IJ SOLN
INTRAMUSCULAR | Status: DC | PRN
  Administered 2022-09-21: .5 mg via INTRAVENOUS

## 2022-09-21 MED ORDER — SUGAMMADEX SODIUM 200 MG/2ML IV SOLN
INTRAVENOUS | Status: DC | PRN
  Administered 2022-09-21: 217.8 mg via INTRAVENOUS

## 2022-09-21 MED ORDER — FENTANYL CITRATE (PF) 250 MCG/5ML IJ SOLN
INTRAMUSCULAR | Status: DC | PRN
  Administered 2022-09-21 (×3): 50 ug via INTRAVENOUS

## 2022-09-21 SURGICAL SUPPLY — 46 items
ADH SKN CLS APL DERMABOND .7 (GAUZE/BANDAGES/DRESSINGS) ×1
APL PRP STRL LF DISP 70% ISPRP (MISCELLANEOUS) ×1
BAG COUNTER SPONGE SURGICOUNT (BAG) ×2 IMPLANT
BAG SPNG CNTER NS LX DISP (BAG) ×1
CANISTER SUCT 3000ML PPV (MISCELLANEOUS) IMPLANT
CHLORAPREP W/TINT 26 (MISCELLANEOUS) ×2 IMPLANT
COVER SURGICAL LIGHT HANDLE (MISCELLANEOUS) ×2 IMPLANT
DERMABOND ADVANCED .7 DNX12 (GAUZE/BANDAGES/DRESSINGS) ×2 IMPLANT
DEVICE SECURE STRAP 25 ABSORB (INSTRUMENTS) ×2 IMPLANT
DEVICE TROCAR PUNCTURE CLOSURE (ENDOMECHANICALS) ×2 IMPLANT
DRAPE LAPAROSCOPIC ABDOMINAL (DRAPES) ×2 IMPLANT
DRSG OPSITE POSTOP 4X8 (GAUZE/BANDAGES/DRESSINGS) IMPLANT
ELECT CAUTERY BLADE 6.4 (BLADE) IMPLANT
ELECT REM PT RETURN 9FT ADLT (ELECTROSURGICAL) ×1
ELECTRODE REM PT RTRN 9FT ADLT (ELECTROSURGICAL) ×2 IMPLANT
GLOVE SURG SIGNA 7.5 PF LTX (GLOVE) ×2 IMPLANT
GOWN STRL REUS W/ TWL LRG LVL3 (GOWN DISPOSABLE) ×4 IMPLANT
GOWN STRL REUS W/ TWL XL LVL3 (GOWN DISPOSABLE) ×2 IMPLANT
GOWN STRL REUS W/TWL LRG LVL3 (GOWN DISPOSABLE) ×2
GOWN STRL REUS W/TWL XL LVL3 (GOWN DISPOSABLE) ×1
KIT BASIN OR (CUSTOM PROCEDURE TRAY) ×2 IMPLANT
KIT TURNOVER KIT B (KITS) ×2 IMPLANT
MARKER SKIN DUAL TIP RULER LAB (MISCELLANEOUS) ×2 IMPLANT
NDL SPNL 22GX3.5 QUINCKE BK (NEEDLE) ×2 IMPLANT
NEEDLE SPNL 22GX3.5 QUINCKE BK (NEEDLE) ×1 IMPLANT
NS IRRIG 1000ML POUR BTL (IV SOLUTION) ×2 IMPLANT
PAD ARMBOARD 7.5X6 YLW CONV (MISCELLANEOUS) ×4 IMPLANT
PENCIL SMOKE EVACUATOR (MISCELLANEOUS) IMPLANT
SET TUBE SMOKE EVAC HIGH FLOW (TUBING) ×2 IMPLANT
SHEARS HARMONIC ACE PLUS 36CM (ENDOMECHANICALS) IMPLANT
SLEEVE Z-THREAD 5X100MM (TROCAR) ×2 IMPLANT
STAPLER VISISTAT 35W (STAPLE) IMPLANT
SUT MON AB 4-0 PC3 18 (SUTURE) ×2 IMPLANT
SUT NOVA NAB DX-16 0-1 5-0 T12 (SUTURE) IMPLANT
SUT PDS AB 1 CT  36 (SUTURE) ×2
SUT PDS AB 1 CT 36 (SUTURE) IMPLANT
SUT SILK 3 0 SH CR/8 (SUTURE) IMPLANT
TOWEL GREEN STERILE (TOWEL DISPOSABLE) ×2 IMPLANT
TRAY FOLEY MTR SLVR 14FR STAT (SET/KITS/TRAYS/PACK) IMPLANT
TRAY LAPAROSCOPIC MC (CUSTOM PROCEDURE TRAY) ×2 IMPLANT
TROCAR 11X100 Z THREAD (TROCAR) ×2 IMPLANT
TROCAR XCEL NON-BLD 5MMX100MML (ENDOMECHANICALS) IMPLANT
TROCAR Z-THREAD OPTICAL 5X100M (TROCAR) ×2 IMPLANT
WARMER LAPAROSCOPE (MISCELLANEOUS) ×2 IMPLANT
WATER STERILE IRR 1000ML POUR (IV SOLUTION) ×2 IMPLANT
YANKAUER SUCT BULB TIP NO VENT (SUCTIONS) IMPLANT

## 2022-09-21 NOTE — Transfer of Care (Signed)
Immediate Anesthesia Transfer of Care Note  Patient: Linda Thomas  Procedure(s) Performed: DIAGNOSTIC LAPAROSCOPY (Abdomen) OPEN HERNIA REPAIR INCISIONAL (Abdomen)  Patient Location: PACU  Anesthesia Type:General  Level of Consciousness: awake, oriented, drowsy, and patient cooperative  Airway & Oxygen Therapy: Patient Spontanous Breathing and Patient connected to face mask oxygen  Post-op Assessment: Report given to RN, Post -op Vital signs reviewed and stable, Patient moving all extremities X 4, and Patient able to stick tongue midline  Post vital signs: Reviewed  Last Vitals:  Vitals Value Taken Time  BP 146/80 09/21/22 1415  Temp 97.8   Pulse 90 09/21/22 1415  Resp 15 09/21/22 1415  SpO2 96 % 09/21/22 1415  Vitals shown include unvalidated device data.  Last Pain: There were no vitals filed for this visit.       Complications: No notable events documented.

## 2022-09-21 NOTE — Op Note (Signed)
DIAGNOSTIC LAPAROSCOPY, OPEN HERNIA REPAIR INCISIONAL  Procedure Note  Linda Thomas 09/21/2022   Pre-op Diagnosis: incisional hernia 6 cm fascial defect with incarcerated bowel     Post-op Diagnosis: incisional hernia 6 cm fascial defect with incarcerated small bowel  Procedure(s): DIAGNOSTIC LAPAROSCOPY OPEN HERNIA REPAIR INCISIONAL  Surgeon(s): Abigail Miyamoto, MD  Anesthesia: General  Staff:  Circulator: Virgel Bouquet, RN Relief Circulator: Hermelinda Dellen, RN Scrub Person: Higinio Roger Vendor Representative : Paralee Cancel  Estimated Blood Loss: Minimal               Indications: This is a 42 year old female who had undergone a laparoscopic converted to open 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  At the time of that surgery she had dense adhesions with her uterus and bladder fixated to the abdominal wall as well as omentum. 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  The decision was made to proceed to the operating room  Findings: The patient was found to have a 6 cm fascial defect in the lower abdomen just at the central portion into the right upper hysterectomy incision.  There was an incarcerated loop of small bowel and separately a loop of small bowel completely fixated to the peritoneum and fascia.  2 separate areas of serosal tears to that loop of small bowel that had been fixated to the abdominal wall repaired primarily with silk sutures.  Given that, the decision was made to repair the hernia primarily  Procedure: The patient was brought to the operating room identified as a correct patient.  She was placed upon the operating table and general anesthesia was induced.  Her abdomen was then prepped and draped in the usual sterile fashion.  I made a small incision in the patient's left upper quadrant with a scalpel and then used the 5 mm trocar and  Optiview camera to traverse all layers of the abdominal wall and gain entrance to the abdominal cavity.  I then evaluated the entrance site and saw no evidence of bowel injury.  The patient had omentum adhesed to the midline the entire length of her abdomen.  I can get down to the pelvis with a camera and see the fascial defect with a loop of small bowel fixated into the fascial defect as well as multiple loops of small bowel fixated to the abdominal wall.  I placed two 5 mm trocars in the patient's left mid and lower quadrant under direct vision.  I then took down omentum bluntly as well as with the laparoscopic scissors to better visualize the bowel.  The incarcerated loop of small bowel was quite fixated into the hernia and adjacent to this on the right side of the abdomen was a loop of small bowel completely fixated for several inches to the abdominal wall.  I could not easily identify any plane between the bowel and the abdominal wall so the decision was made to convert to an open procedure.  All trocars were removed under direct vision and the abdomen was deflated.  The fascial defect felt slightly above the scar from her Pfannenstiel incision.  I elected to make a midline incision with a scalpel.  I then dissected down through the subcutaneous tissue to the fascia.  I entered the fascial defect with a scalpel.  I then slowly had to open the fascia and was able to free up the incarcerated loop of small bowel  with the Metzenbaum scissors.  Just to the right of the fascial defect there was a loop of small bowel completely fixated to the abdominal wall.  I had to slowly separate the bowel from the abdominal wall with the Metzenbaum scissors leaving some peritoneum on the small bowel.  I was then able to eviscerate the entire loop of small bowel.  There were 2 separate serosal tears which I repaired with interrupted 3-0 silk sutures.  There was no evidence of complete opening of the bowel in any area.  The sigmoid  colon was coming and into the edge of the hernia as well and had to separate it from the lower peritoneal surface in order to repair the fascial defect using the Metzenbaum scissors as well.  At this point, the decision was made to primarily repair the hernia given the multiple serosal repairs to the small bowel.  I reevaluated the small bowel and again it appeared patent.  I was then able to close the abdominal wall fascia and the separate small fascial defect with a running #1 looped PDS suture as well as interrupted figure-of-eight #1 Novafil sutures.  We then irrigated the skin subcutaneous tissue.  I anesthetized it with Marcaine.  I then closed the skin with skin staples.  The trocar sites were anesthetized and closed with 4-0 Monocryl sutures.  Honeycomb dressing was then applied as well as Dermabond.  The patient tolerated the procedure well.  All the counts were correct at the end of the procedure.  The patient was then extubated in the operating room and taken in a stable condition to the recovery room.          Abigail Miyamoto   Date: 09/21/2022  Time: 2:00 PM

## 2022-09-21 NOTE — Anesthesia Procedure Notes (Signed)
Procedure Name: Intubation Date/Time: 09/21/2022 12:29 PM  Performed by: Cy Blamer, CRNAPre-anesthesia Checklist: Patient identified, Emergency Drugs available, Suction available and Patient being monitored Patient Re-evaluated:Patient Re-evaluated prior to induction Oxygen Delivery Method: Circle system utilized Preoxygenation: Pre-oxygenation with 100% oxygen Induction Type: IV induction Ventilation: Mask ventilation without difficulty Laryngoscope Size: Miller and 2 Grade View: Grade I Tube type: Oral Tube size: 7.0 mm Number of attempts: 1 Airway Equipment and Method: Stylet and Bite block Placement Confirmation: ETT inserted through vocal cords under direct vision, positive ETCO2 and breath sounds checked- equal and bilateral Secured at: 21 cm Tube secured with: Tape Dental Injury: Teeth and Oropharynx as per pre-operative assessment

## 2022-09-21 NOTE — ED Provider Notes (Signed)
[place] EMERGENCY DEPARTMENT AT [facility] Provider Note   CSN: [id number] Arrival date & time: 09/21/22  0025     History Chief Complaint  Patient presents with   Abdominal Pain    Linda Thomas is a 42 y.o. female with history of hypertension and diabetes, hysterectomy, 2 C-sections presents the emergency room today for evaluation of gradually improving right lower quadrant and periumbilical abdominal pain since Saturday.  She reports that she has had 2 episodes of watery diarrhea but otherwise no normal stools.  No melena or hematochezia.  She denies any nausea, vomiting, dysuria, hematuria, vaginal bleeding, vaginal discharge.  Denies any chest pain or shortness of breath.  She reports that the pain has improved since Saturday but was still concerned still present.  She denies any trauma to the abdomen.  No known drug allergies.  Denies any tobacco, EtOH, illicit drug use.   Abdominal Pain Associated symptoms: diarrhea   Associated symptoms: no chest pain, no chills, no constipation, no cough, no dysuria, no fever, no hematuria, no nausea, no shortness of breath, no vaginal bleeding, no vaginal discharge and no vomiting        Home Medications Prior to Admission medications   Medication Sig Start Date End Date Taking? Authorizing Provider  acetaZOLAMIDE (DIAMOX) 250 MG tablet Take 1 tablet (250 mg total) by mouth 2 (two) times daily. 07/31/19   Anson Fret, MD  furosemide (LASIX) 20 MG tablet Take 1 tablet (20 mg total) by mouth 2 (two) times daily. 08/27/21   Roxy Horseman, PA-C  ibuprofen (ADVIL) 800 MG tablet Take 1 tablet (800 mg total) by mouth 3 (three) times daily with meals as needed for headache, mild pain, moderate pain or cramping. 09/05/19   Anyanwu, Jethro Bastos, MD  lisinopril (ZESTRIL) 5 MG tablet Take 1 tablet (5 mg total) by mouth daily. 11/27/19   Conan Bowens, MD  Multiple Vitamin (MULTIVITAMIN) capsule Take 1 capsule by mouth daily.     [provider]      Allergies    Other and Penicillins    Review of Systems   Review of Systems  Constitutional:  Negative for chills and fever.  Respiratory:  Negative for cough and shortness of breath.   Cardiovascular:  Negative for chest pain.  Gastrointestinal:  Positive for abdominal pain and diarrhea. Negative for blood in stool, constipation, nausea and vomiting.  Genitourinary:  Negative for dysuria, flank pain, hematuria, vaginal bleeding and vaginal discharge.  Musculoskeletal:  Negative for back pain.    Physical Exam Updated Vital Signs BP (!) 145/89   Pulse 80   Temp 98 F (36.7 C)   Resp 14   LMP 07/25/2019 (Within Weeks)   SpO2 97%  Physical Exam Vitals and nursing note reviewed.  Constitutional:      General: She is not in acute distress.    Appearance: Normal appearance. She is not ill-appearing or toxic-appearing.  HENT:     Head: Normocephalic and atraumatic.     Mouth/Throat:     Mouth: Mucous membranes are moist.  Eyes:     General: No scleral icterus. Cardiovascular:     Rate and Rhythm: Normal rate and regular rhythm.  Pulmonary:     Effort: Pulmonary effort is normal. No respiratory distress.     Breath sounds: Normal breath sounds.  Abdominal:     General: Bowel sounds are normal. There is no distension.     Palpations: Abdomen is soft.  Tenderness: There is abdominal tenderness in the right lower quadrant and periumbilical area. There is no right CVA tenderness, left CVA tenderness, guarding or rebound.     Comments: Some mild periumbilical and right lower quadrant tenderness palpation.  Soft.  No guarding or rebound.  I do not appreciate any hernias.  There is some faint overlying surgical incision marks and some abdominal striae however no other abdominal overlying acute skin changes.  Musculoskeletal:        General: No deformity.     Cervical back: Normal range of motion.  Skin:    General: Skin is warm and dry.   Neurological:     General: No focal deficit present.     Mental Status: She is alert. Mental status is at baseline.     ED Results / Procedures / Treatments   Labs (all labs ordered are listed, but only abnormal results are displayed) Labs Reviewed  COMPREHENSIVE METABOLIC PANEL - Abnormal; Notable for the following components:      Result Value   Glucose, Bld 112 (*)    Calcium 8.7 (*)    Total Bilirubin 0.2 (*)    All other components within normal limits  CBC WITH DIFFERENTIAL/PLATELET - Abnormal; Notable for the following components:   Hemoglobin 11.9 (*)    All other components within normal limits  LIPASE, BLOOD  URINALYSIS, ROUTINE W REFLEX MICROSCOPIC  I-STAT BETA HCG BLOOD, ED ([facility], [facility], [facility] ONLY)    EKG None  Radiology CT ABDOMEN PELVIS W CONTRAST  Result Date: 09/21/2022 CLINICAL DATA:  RLQ abdominal pain EXAM: CT ABDOMEN AND PELVIS WITH CONTRAST TECHNIQUE: Multidetector CT imaging of the abdomen and pelvis was performed using the standard protocol following bolus administration of intravenous contrast. RADIATION DOSE REDUCTION: This exam was performed according to the departmental dose-optimization program which includes automated exposure control, adjustment of the mA and/or kV according to patient size and/or use of iterative reconstruction technique. CONTRAST:  75mL OMNIPAQUE IOHEXOL 350 MG/ML SOLN COMPARISON:  CT [facility], 10/06/2019. FINDINGS: Lower chest: No acute abnormality. Hepatobiliary: No focal liver abnormality is seen. No gallstones, gallbladder wall thickening, or biliary dilatation. Pancreas: No pancreatic ductal dilatation or surrounding inflammatory changes. Spleen: Normal in size without focal abnormality. Small perihilar accessory spleen. Adrenals/Urinary Tract: Adrenal glands are unremarkable. Kidneys are normal, without renal calculi, focal lesion, or hydronephrosis. Bladder is unremarkable. Stomach/Bowel: Stomach is within normal limits. Appendix appears  normal. No evidence of bowel wall thickening, distention, or inflammatory changes. Focal outpouching of distal small bowel anti-mesenteric loop of the RIGHT inferolateral abdomen (see key image). No evidence of obstruction or vascular compromise to bowel Vascular/Lymphatic: No significant vascular findings are present. No enlarged abdominal or pelvic lymph nodes. Reproductive: Status post hysterectomy. No adnexal mass. Other: Trace superior abdominal mesenteric stranding with a few prominent though nonenlarged lymph nodes, consistent with mesenteric adenitis. Lower anterior abdominal incisional hernia with small fascial defect. No abdominopelvic ascites. Musculoskeletal: No acute or significant osseous findings. IMPRESSION: 1. Postsurgical changes of hysterectomy with lower anterior abdominal incisional hernia. Focal outpouching of distal small bowel anti-mesenteric loop favored consistent with a Richter hernia. No evidence of obstruction or vascular compromise to bowel. 2. Mesenteric adenitis, incidental and self limiting. Electronically Signed   By: Roanna Banning M.D.   On: 09/21/2022 05:54    Procedures Procedures   Medications Ordered in ED Medications  iohexol (OMNIPAQUE) 350 MG/ML injection 75 mL (75 mLs Intravenous Contrast Given 09/21/22 0524)    ED Course/ Medical Decision Making/  A&P                            Medical Decision Making Amount and/or Complexity of Data Reviewed Labs: ordered. Radiology: ordered.  Risk Prescription drug management.   42 y.o. female presents to the ER for evaluation of RLQ and periumbilical pain. Differential diagnosis includes but is not limited to AAA, mesenteric ischemia, appendicitis, diverticulitis, DKA, gastroenteritis, nephrolithiasis, pancreatitis, constipation, UTI, bowel obstruction, biliary disease, IBD, PUD, hepatitis. Vital signs blood pressure 145/89, afebrile, normal pulse rate, satting well room air without increased work of breathing.  Physical exam as noted above.   CT and labs ordered.  I independently reviewed and interpreted the patient's labs.  CMP shows glucose at 112, mildly decreased calcium 8.7.  Mildly decreased total bilirubin at 0.2, otherwise no electrolyte or LFT abnormality.  Lipase in the normal limits.  CBC shows mild decrease in hemoglobin 11.9.  No leukocytosis.  Urinalysis unremarkable.  hCG negative.  CT imaging shows  1. Postsurgical changes of hysterectomy with lower anterior  abdominal incisional hernia.  Focal outpouching of distal small bowel anti-mesenteric loop favored  consistent with a Richter hernia.  No evidence of obstruction or vascular compromise to bowel.  2. Mesenteric adenitis, incidental and self limiting.   I consulted general surgery and spoke with Dr. Doylene Canard who will see the patient and leave recommendations.   7:44 AM Care of [facility]  transferred to PA Fayrene Helper at the end of my shift as the patient will require reassessment once labs/imaging have resulted. Patient presentation, ED course, and plan of care discussed with review of all pertinent labs and imaging. Please see his/her note for further details regarding further ED course and disposition. Plan at time of handoff is follow up with surgery recommendations. This may be altered or completely changed at the discretion of the oncoming team pending results of further workup.  Portions of this report may have been transcribed using voice recognition software. Every effort was made to ensure accuracy; however, inadvertent computerized transcription errors may be present.   Final Clinical Impression(s) / ED Diagnoses Final diagnoses:  None    Rx / DC Orders ED Discharge Orders     None         Achille Rich, PA-C 09/21/22 0744    Tilden Fossa, MD 09/21/22 [phone]

## 2022-09-21 NOTE — ED Notes (Signed)
Pt transported to CT ?

## 2022-09-21 NOTE — ED Notes (Signed)
Surgeon at bedside.  

## 2022-09-21 NOTE — ED Provider Notes (Signed)
Received signout from previous provider, please see her note for complete H&P.  This is a 42 year old female presenting with complaints of abdominal pain.  She has been experiencing pain to her right side of abdomen that radiates to her umbilicus ongoing for the past 4 days.  She also endorsed some diarrhea.  CT scan of the abdomen pelvis was obtained that demonstrate postsurgical changes of hysterectomy with lower anterior abdominal incisional hernia with a focal outpouching of the distal small bowel antimesenteric loop favor consistent with a Richter hernia.  Surgery team was consulted and will evaluate patient and provide disposition.  8:43 AM Surgery team has seen evaluate patient and felt the patient will need to be admitted for further surgical intervention as her pain is still unremitting.  Surgery team will admit patient.  BP (!) 187/102   Pulse 79   Temp 98 F (36.7 C)   Resp 18   LMP 07/25/2019 (Within Weeks)   SpO2 99%   Results for orders placed or performed during the hospital encounter of 09/21/22  Comprehensive metabolic panel  Result Value Ref Range   Sodium 135 135 - 145 mmol/L   Potassium 3.5 3.5 - 5.1 mmol/L   Chloride 106 98 - 111 mmol/L   CO2 23 22 - 32 mmol/L   Glucose, Bld 112 (H) 70 - 99 mg/dL   BUN 15 6 - 20 mg/dL   Creatinine, Ser 3.08 0.44 - 1.00 mg/dL   Calcium 8.7 (L) 8.9 - 10.3 mg/dL   Total Protein 7.4 6.5 - 8.1 g/dL   Albumin 3.8 3.5 - 5.0 g/dL   AST 21 15 - 41 U/L   ALT 23 0 - 44 U/L   Alkaline Phosphatase 83 38 - 126 U/L   Total Bilirubin 0.2 (L) 0.3 - 1.2 mg/dL   GFR, Estimated >65 >78 mL/min   Anion gap 6 5 - 15  Lipase, blood  Result Value Ref Range   Lipase 28 11 - 51 U/L  CBC with Diff  Result Value Ref Range   WBC 5.9 4.0 - 10.5 K/uL   RBC 4.47 3.87 - 5.11 MIL/uL   Hemoglobin 11.9 (L) 12.0 - 15.0 g/dL   HCT 46.9 62.9 - 52.8 %   MCV 84.6 80.0 - 100.0 fL   MCH 26.6 26.0 - 34.0 pg   MCHC 31.5 30.0 - 36.0 g/dL   RDW 41.3 24.4 - 01.0 %    Platelets 218 150 - 400 K/uL   nRBC 0.0 0.0 - 0.2 %   Neutrophils Relative % 45 %   Neutro Abs 2.6 1.7 - 7.7 K/uL   Lymphocytes Relative 43 %   Lymphs Abs 2.5 0.7 - 4.0 K/uL   Monocytes Relative 10 %   Monocytes Absolute 0.6 0.1 - 1.0 K/uL   Eosinophils Relative 2 %   Eosinophils Absolute 0.1 0.0 - 0.5 K/uL   Basophils Relative 0 %   Basophils Absolute 0.0 0.0 - 0.1 K/uL   Immature Granulocytes 0 %   Abs Immature Granulocytes 0.02 0.00 - 0.07 K/uL  Urinalysis, Routine w reflex microscopic -Urine, Clean Catch  Result Value Ref Range   Color, Urine YELLOW YELLOW   APPearance CLEAR CLEAR   Specific Gravity, Urine 1.017 1.005 - 1.030   pH 7.0 5.0 - 8.0   Glucose, UA NEGATIVE NEGATIVE mg/dL   Hgb urine dipstick NEGATIVE NEGATIVE   Bilirubin Urine NEGATIVE NEGATIVE   Ketones, ur NEGATIVE NEGATIVE mg/dL   Protein, ur NEGATIVE NEGATIVE mg/dL  Nitrite NEGATIVE NEGATIVE   Leukocytes,Ua NEGATIVE NEGATIVE  I-Stat beta hCG blood, ED ([facility], [facility], [facility] only)  Result Value Ref Range   I-stat hCG, quantitative <5.0 <5 mIU/mL   Comment 3           CT ABDOMEN PELVIS W CONTRAST  Result Date: 09/21/2022 CLINICAL DATA:  RLQ abdominal pain EXAM: CT ABDOMEN AND PELVIS WITH CONTRAST TECHNIQUE: Multidetector CT imaging of the abdomen and pelvis was performed using the standard protocol following bolus administration of intravenous contrast. RADIATION DOSE REDUCTION: This exam was performed according to the departmental dose-optimization program which includes automated exposure control, adjustment of the mA and/or kV according to patient size and/or use of iterative reconstruction technique. CONTRAST:  75mL OMNIPAQUE IOHEXOL 350 MG/ML SOLN COMPARISON:  CT [facility], 10/06/2019. FINDINGS: Lower chest: No acute abnormality. Hepatobiliary: No focal liver abnormality is seen. No gallstones, gallbladder wall thickening, or biliary dilatation. Pancreas: No pancreatic ductal dilatation or surrounding inflammatory changes.  Spleen: Normal in size without focal abnormality. Small perihilar accessory spleen. Adrenals/Urinary Tract: Adrenal glands are unremarkable. Kidneys are normal, without renal calculi, focal lesion, or hydronephrosis. Bladder is unremarkable. Stomach/Bowel: Stomach is within normal limits. Appendix appears normal. No evidence of bowel wall thickening, distention, or inflammatory changes. Focal outpouching of distal small bowel anti-mesenteric loop of the RIGHT inferolateral abdomen (see key image). No evidence of obstruction or vascular compromise to bowel Vascular/Lymphatic: No significant vascular findings are present. No enlarged abdominal or pelvic lymph nodes. Reproductive: Status post hysterectomy. No adnexal mass. Other: Trace superior abdominal mesenteric stranding with a few prominent though nonenlarged lymph nodes, consistent with mesenteric adenitis. Lower anterior abdominal incisional hernia with small fascial defect. No abdominopelvic ascites. Musculoskeletal: No acute or significant osseous findings. IMPRESSION: 1. Postsurgical changes of hysterectomy with lower anterior abdominal incisional hernia. Focal outpouching of distal small bowel anti-mesenteric loop favored consistent with a Richter hernia. No evidence of obstruction or vascular compromise to bowel. 2. Mesenteric adenitis, incidental and self limiting. Electronically Signed   By: Roanna Banning M.D.   On: 09/21/2022 05:54      Fayrene Helper, PA-C 09/21/22 0843    Melene Plan, DO 09/21/22 [phone]

## 2022-09-21 NOTE — ED Provider Triage Note (Signed)
Emergency Medicine Provider Triage Evaluation Note  Linda Thomas , a 42 y.o. female  was evaluated in triage.  Pt complains of right lower quadrant pain since Saturday.  Worse with movement.  Reports 2 episodes of watery diarrhea per day but denies any constipation.  Denies any nausea, vomiting, urinary, or vaginal symptoms.  Review of Systems  Positive:  Negative:   Physical Exam  BP (!) 185/100 (BP Location: Right Arm)   Pulse 88   Temp 98.2 F (36.8 C)   Resp 16   LMP 07/25/2019 (Within Weeks)   SpO2 97%  Gen:   Awake, no distress   Resp:  Normal effort  MSK:   Moves extremities without difficulty  Other:  Right lower quadrant and periumbilical tenderness palpation.  Soft.  No guarding rebound noted.  Medical Decision Making  Medically screening exam initiated at 12:59 AM.  Appropriate orders placed.  Linda Thomas was informed that the remainder of the evaluation will be completed by another provider, this initial triage assessment does not replace that evaluation, and the importance of remaining in the ED until their evaluation is complete.  Labs and imaging ordered.   Achille Rich, PA-C 09/21/22 0100

## 2022-09-21 NOTE — Anesthesia Preprocedure Evaluation (Signed)
Anesthesia Evaluation  Patient identified by MRN, date of birth, ID band Patient awake    Reviewed: Allergy & Precautions, H&P , NPO status , Patient's Chart, lab work & pertinent test results  Airway Mallampati: II  TM Distance: >3 FB Neck ROM: Full    Dental no notable dental hx.    Pulmonary neg pulmonary ROS, former smoker   Pulmonary exam normal breath sounds clear to auscultation       Cardiovascular hypertension, Normal cardiovascular exam Rhythm:Regular Rate:Normal     Neuro/Psych negative neurological ROS  negative psych ROS   GI/Hepatic negative GI ROS, Neg liver ROS,,,  Endo/Other    Morbid obesity  Renal/GU negative Renal ROS  negative genitourinary   Musculoskeletal negative musculoskeletal ROS (+)    Abdominal   Peds negative pediatric ROS (+)  Hematology negative hematology ROS (+)   Anesthesia Other Findings   Reproductive/Obstetrics negative OB ROS                             Anesthesia Physical Anesthesia Plan  ASA: 3  Anesthesia Plan: General   Post-op Pain Management:    Induction: Intravenous and Rapid sequence  PONV Risk Score and Plan: 3 and Ondansetron, Dexamethasone, Treatment may vary due to age or medical condition and Midazolam  Airway Management Planned: Oral ETT  Additional Equipment:   Intra-op Plan:   Post-operative Plan: Extubation in OR  Informed Consent: I have reviewed the patients History and Physical, chart, labs and discussed the procedure including the risks, benefits and alternatives for the proposed anesthesia with the patient or authorized representative who has indicated his/her understanding and acceptance.     Dental advisory given  Plan Discussed with: CRNA and Surgeon  Anesthesia Plan Comments:        Anesthesia Quick Evaluation

## 2022-09-21 NOTE — Plan of Care (Signed)

## 2022-09-21 NOTE — ED Triage Notes (Signed)
R sided belly pain radiating into umbilical region. Started Saturday. Tender to palpation. Diarrhea.

## 2022-09-21 NOTE — Anesthesia Postprocedure Evaluation (Signed)
Anesthesia Post Note  Patient: Ardelle Park  Procedure(s) Performed: DIAGNOSTIC LAPAROSCOPY (Abdomen) OPEN HERNIA REPAIR INCISIONAL (Abdomen)     Patient location during evaluation: PACU Anesthesia Type: General Level of consciousness: awake and alert Pain management: pain level controlled Vital Signs Assessment: post-procedure vital signs reviewed and stable Respiratory status: spontaneous breathing, nonlabored ventilation, respiratory function stable and patient connected to nasal cannula oxygen Cardiovascular status: blood pressure returned to baseline and stable Postop Assessment: no apparent nausea or vomiting Anesthetic complications: no  No notable events documented.  Last Vitals:  Vitals:   09/21/22 0900 09/21/22 1415  BP: (!) 161/96 (!) 146/80  Pulse: 73 87  Resp: 17 16  Temp:  36.7 C  SpO2: 98% 97%    Last Pain:  Vitals:   09/21/22 1415  PainSc: Asleep                 Taksh Hjort S

## 2022-09-21 NOTE — ED Notes (Signed)
PT ambulated to restroom with steady gait and no reported difficulties.

## 2022-09-21 NOTE — H&P (Signed)
Admission Note  Linda Thomas [birth year]  [id number].    Requesting MD: Achille Rich PA-C Chief Complaint/Reason for Consult: hernia  HPI:  42 y.o. female with medical history significant for newly diagnosed type II DM, hypertension who presented to [name] with abdominal pain.  Pain began 4 days ago and was initially severe.  It has been improving since but not completely resolved.  It is located in the right lower quadrant where she has also noted a small bulge.  Pain now comes and goes.  She denies previous episodes of similar pain.  She has had some nausea with the pain which is now resolved.  No emesis.  She has been having bowel movements, last BM yesterday morning.  She denies fever, chills, shortness of breath, respiratory symptoms, urinary symptoms.  Workup in the ED significant for normal WBC, CT scan showing postsurgical changes of hysterectomy with lower anterior abdominal incisional hernia.  Focal outpouching of distal small bowel antimesenteric loop favored consistent with a rector hernia.  General surgery asked to see.  At time of my exam she is still having up to moderate pain which is worsened with palpation.  She is accompanied by her daughter  Substance use: Occasional EtOH Allergies: Penicillin -itching Blood thinners: None Past Surgeries: Cesarean section x 2, hysterectomy  ROS: ROS as above  Family History  Problem Relation Age of Onset   Lung cancer Father    Bleeding Disorder Neg Hx    Pseudotumor cerebri Neg Hx     Past Medical History:  Diagnosis Date   Bell's palsy    Hyperglycemia    A1c 6.4% 08/30/19   Hypertension during pregnancy   Hypertension    Idiopathic intracranial hypertension    Migraine     Past Surgical History:  Procedure Laterality Date   CESAREAN SECTION     x2   HYSTERECTOMY ABDOMINAL WITH SALPINGECTOMY Bilateral 09/03/2019   Procedure: HYSTERECTOMY ABDOMINAL WITH BILATERAL SALPINGECTOMY;  Surgeon: Tereso Newcomer, MD;  Location: [facility] OR;  Service: Gynecology;  Laterality: Bilateral;   LAPAROSCOPY N/A 09/03/2019   Procedure: Laparoscopy Diagnostic;  Surgeon: Tereso Newcomer, MD;  Location: [facility] OR;  Service: Gynecology;  Laterality: N/A;   TUBAL LIGATION      Social History:  reports that she quit smoking about 10 years ago. She has never used smokeless tobacco. She reports current alcohol use. She reports that she does not use drugs.  Allergies:  Allergies  Allergen Reactions   Other Itching    Reaction to unknown antibiotic at age 15   Penicillins Itching    Has patient had a PCN reaction causing immediate rash, facial/tongue/throat swelling, SOB or lightheadedness with hypotension: No Has patient had a PCN reaction causing severe rash involving mucus membranes or skin necrosis: No Has patient had a PCN reaction that required hospitalization: No Has patient had a PCN reaction occurring within the last 10 years: No If all of the above answers are "NO", then may proceed with Cephalosporin use.     (Not in a hospital admission)   Blood pressure (!) 187/102, pulse 79, temperature 98 F (36.7 C), resp. rate 18, last menstrual period 07/25/2019, SpO2 99 %. Physical Exam: General: pleasant, WD, female who is laying in bed in NAD HEENT: head is normocephalic, atraumatic.  Sclera are noninjected.  Pupils equal and round. EOMs intact.  Ears and nose without any masses or lesions.  Mouth is pink and moist Heart: regular, rate, and  rhythm.  Normal s1,s2. No obvious murmurs, gallops, or rubs noted.  Lungs: Respiratory effort nonlabored Abd: soft, ND, +BS, mild tenderness to palpation in periumbilical region.  Moderate tenderness to palpation without rebound or guarding in right lower quadrant where soft hernia is palpable MSK: all 4 extremities are symmetrical with no cyanosis, clubbing, or edema. Skin: warm and dry with no masses, lesions, or rashes Neuro: Cranial nerves 2-12 grossly intact,  sensation is normal throughout Psych: A&Ox3 with an appropriate affect.    Results for orders placed or performed during the hospital encounter of 09/21/22 (from the past 48 hour(s))  Urinalysis, Routine w reflex microscopic -Urine, Clean Catch     Status: None   Collection Time: 09/21/22  1:00 AM  Result Value Ref Range   Color, Urine YELLOW YELLOW   APPearance CLEAR CLEAR   Specific Gravity, Urine 1.017 1.005 - 1.030   pH 7.0 5.0 - 8.0   Glucose, UA NEGATIVE NEGATIVE mg/dL   Hgb urine dipstick NEGATIVE NEGATIVE   Bilirubin Urine NEGATIVE NEGATIVE   Ketones, ur NEGATIVE NEGATIVE mg/dL   Protein, ur NEGATIVE NEGATIVE mg/dL   Nitrite NEGATIVE NEGATIVE   Leukocytes,Ua NEGATIVE NEGATIVE    Comment: Performed at [facility], [address], [place], [place] [address]  Comprehensive metabolic panel     Status: Abnormal   Collection Time: 09/21/22  1:04 AM  Result Value Ref Range   Sodium 135 135 - 145 mmol/L   Potassium 3.5 3.5 - 5.1 mmol/L   Chloride 106 98 - 111 mmol/L   CO2 23 22 - 32 mmol/L   Glucose, Bld 112 (H) 70 - 99 mg/dL    Comment: Glucose reference range applies only to samples taken after fasting for at least 8 hours.   BUN 15 6 - 20 mg/dL   Creatinine, Ser 6.04 0.44 - 1.00 mg/dL   Calcium 8.7 (L) 8.9 - 10.3 mg/dL   Total Protein 7.4 6.5 - 8.1 g/dL   Albumin 3.8 3.5 - 5.0 g/dL   AST 21 15 - 41 U/L   ALT 23 0 - 44 U/L   Alkaline Phosphatase 83 38 - 126 U/L   Total Bilirubin 0.2 (L) 0.3 - 1.2 mg/dL   GFR, Estimated >54 >09 mL/min    Comment: (NOTE) Calculated using the CKD-EPI Creatinine Equation (2021)    Anion gap 6 5 - 15    Comment: Performed at [facility], [address], [place], [place] [address]  Lipase, blood     Status: None   Collection Time: 09/21/22  1:04 AM  Result Value Ref Range   Lipase 28 11 - 51 U/L    Comment: Performed at [facility], [address] [place], [place] [address]  CBC with Diff     Status: Abnormal    Collection Time: 09/21/22  1:04 AM  Result Value Ref Range   WBC 5.9 4.0 - 10.5 K/uL   RBC 4.47 3.87 - 5.11 MIL/uL   Hemoglobin 11.9 (L) 12.0 - 15.0 g/dL   HCT 56.2 13.0 - 86.5 %   MCV 84.6 80.0 - 100.0 fL   MCH 26.6 26.0 - 34.0 pg   MCHC 31.5 30.0 - 36.0 g/dL   RDW 78.4 69.6 - 29.5 %   Platelets 218 150 - 400 K/uL   nRBC 0.0 0.0 - 0.2 %   Neutrophils Relative % 45 %   Neutro Abs 2.6 1.7 - 7.7 K/uL   Lymphocytes Relative 43 %  Lymphs Abs 2.5 0.7 - 4.0 K/uL   Monocytes Relative 10 %   Monocytes Absolute 0.6 0.1 - 1.0 K/uL   Eosinophils Relative 2 %   Eosinophils Absolute 0.1 0.0 - 0.5 K/uL   Basophils Relative 0 %   Basophils Absolute 0.0 0.0 - 0.1 K/uL   Immature Granulocytes 0 %   Abs Immature Granulocytes 0.02 0.00 - 0.07 K/uL    Comment: Performed at [facility], [address] [place], [place] [address]  I-Stat beta hCG blood, ED ([facility], [facility], [facility] only)     Status: None   Collection Time: 09/21/22  1:51 AM  Result Value Ref Range   I-stat hCG, quantitative <5.0 <5 mIU/mL   Comment 3            Comment:   GEST. AGE      CONC.  (mIU/mL)   <=1 WEEK        5 - 50     2 WEEKS       50 - 500     3 WEEKS       100 - 10,000     4 WEEKS     1,000 - 30,000        FEMALE AND NON-PREGNANT FEMALE:     LESS THAN 5 mIU/mL    CT ABDOMEN PELVIS W CONTRAST  Result Date: 09/21/2022 CLINICAL DATA:  RLQ abdominal pain EXAM: CT ABDOMEN AND PELVIS WITH CONTRAST TECHNIQUE: Multidetector CT imaging of the abdomen and pelvis was performed using the standard protocol following bolus administration of intravenous contrast. RADIATION DOSE REDUCTION: This exam was performed according to the departmental dose-optimization program which includes automated exposure control, adjustment of the mA and/or kV according to patient size and/or use of iterative reconstruction technique. CONTRAST:  75mL OMNIPAQUE IOHEXOL 350 MG/ML SOLN COMPARISON:  CT [facility], 10/06/2019. FINDINGS: Lower chest: No acute abnormality.  Hepatobiliary: No focal liver abnormality is seen. No gallstones, gallbladder wall thickening, or biliary dilatation. Pancreas: No pancreatic ductal dilatation or surrounding inflammatory changes. Spleen: Normal in size without focal abnormality. Small perihilar accessory spleen. Adrenals/Urinary Tract: Adrenal glands are unremarkable. Kidneys are normal, without renal calculi, focal lesion, or hydronephrosis. Bladder is unremarkable. Stomach/Bowel: Stomach is within normal limits. Appendix appears normal. No evidence of bowel wall thickening, distention, or inflammatory changes. Focal outpouching of distal small bowel anti-mesenteric loop of the RIGHT inferolateral abdomen (see key image). No evidence of obstruction or vascular compromise to bowel Vascular/Lymphatic: No significant vascular findings are present. No enlarged abdominal or pelvic lymph nodes. Reproductive: Status post hysterectomy. No adnexal mass. Other: Trace superior abdominal mesenteric stranding with a few prominent though nonenlarged lymph nodes, consistent with mesenteric adenitis. Lower anterior abdominal incisional hernia with small fascial defect. No abdominopelvic ascites. Musculoskeletal: No acute or significant osseous findings. IMPRESSION: 1. Postsurgical changes of hysterectomy with lower anterior abdominal incisional hernia. Focal outpouching of distal small bowel anti-mesenteric loop favored consistent with a Richter hernia. No evidence of obstruction or vascular compromise to bowel. 2. Mesenteric adenitis, incidental and self limiting. Electronically Signed   By: Roanna Banning M.D.   On: 09/21/2022 05:54      Assessment/Plan Incisional hernia - Richter Hernia  Patient seen and examined and relevant labs and imaging personally reviewed.  CT shows a incisional hernia with focal outpouching of distal small bowel consistent with Richter hernia.  She is not obstructed.  There is no vascular compromise on CT and clinically does not  appear to have ischemic  bowel.  She does remain tender on exam and hernia will not resolve without surgical intervention.  Recommend OR today for laparoscopic possible open hernia repair.   FEN: NPO for OR ID: cipro periop (pcn allergy) VTE: SCDs   I reviewed nursing notes, ED provider notes, last 24 h vitals and pain scores, last 48 h intake and output, last 24 h labs and trends, and last 24 h imaging results.   Eric Form, [facility] Surgery 09/21/2022, 8:41 AM Please see Amion for pager number during day hours 7:00am-4:30pm

## 2022-09-22 ENCOUNTER — Encounter (HOSPITAL_COMMUNITY): Payer: Self-pay | Admitting: Surgery

## 2022-09-22 LAB — BASIC METABOLIC PANEL
Anion gap: 7 (ref 5–15)
BUN: 14 mg/dL (ref 6–20)
CO2: 23 mmol/L (ref 22–32)
Calcium: 8.2 mg/dL — ABNORMAL LOW (ref 8.9–10.3)
Chloride: 104 mmol/L (ref 98–111)
Creatinine, Ser: 0.91 mg/dL (ref 0.44–1.00)
GFR, Estimated: >60 mL/min (ref >60)
Glucose, Bld: 165 mg/dL — ABNORMAL HIGH (ref 70–99)
Potassium: 4.4 mmol/L (ref 3.5–5.1)
Sodium: 134 mmol/L — ABNORMAL LOW (ref 135–145)

## 2022-09-22 LAB — GLUCOSE, CAPILLARY
Glucose-Capillary: 132 mg/dL — ABNORMAL HIGH (ref 70–99)
Glucose-Capillary: 180 mg/dL — ABNORMAL HIGH (ref 70–99)
Glucose-Capillary: 155 mg/dL — ABNORMAL HIGH (ref 70–99)

## 2022-09-22 LAB — CBC
HCT: 36.1 % (ref 36.0–46.0)
Hemoglobin: 11.8 g/dL — ABNORMAL LOW (ref 12.0–15.0)
MCH: 27.8 pg (ref 26.0–34.0)
MCHC: 32.7 g/dL (ref 30.0–36.0)
MCV: 84.9 fL (ref 80.0–100.0)
Platelets: 202 10*3/uL (ref 150–400)
RBC: 4.25 MIL/uL (ref 3.87–5.11)
RDW: 13.5 % (ref 11.5–15.5)
WBC: 8.9 10*3/uL (ref 4.0–10.5)
nRBC: 0 % (ref 0.0–0.2)

## 2022-09-22 MED ORDER — GUAIFENESIN ER 600 MG PO TB12
600.0000 mg | ORAL_TABLET | Freq: Two times a day (BID) | ORAL | Status: DC
  Administered 2022-09-22 – 2022-09-26 (×8): 600 mg via ORAL
  Filled 2022-09-22 (×8): qty 1

## 2022-09-22 MED ORDER — METHOCARBAMOL 500 MG PO TABS
500.0000 mg | ORAL_TABLET | Freq: Three times a day (TID) | ORAL | Status: DC
  Administered 2022-09-22 – 2022-09-26 (×14): 500 mg via ORAL
  Filled 2022-09-22 (×15): qty 1

## 2022-09-22 NOTE — Discharge Instructions (Signed)
[facility] _______[facility] ? ?HERNIA REPAIR: POST OP INSTRUCTIONS ? ?Always review your discharge instruction sheet given to you by the facility where your surgery was performed. ?IF YOU HAVE DISABILITY OR FAMILY LEAVE FORMS, YOU MUST BRING THEM TO THE OFFICE FOR PROCESSING.   ?DO NOT GIVE THEM TO YOUR DOCTOR. ? ?1. A  prescription for pain medication may be given to you upon discharge.  Take your pain medication as prescribed, if needed.  If narcotic pain medicine is not needed, then you may take acetaminophen (Tylenol) or ibuprofen (Advil) as needed. ?2. Take your usually prescribed medications unless otherwise directed. ?If you need a refill on your pain medication, please contact your pharmacy.  They will contact our office to request authorization. Prescriptions will not be filled after 5 pm or on week-ends. ?3. You should follow a light diet the first 24 hours after arrival home, such as soup and crackers, etc.  Be sure to include lots of fluids daily.  Resume your normal diet the day after surgery. ?4.Most patients will experience some swelling and bruising around the umbilicus or in the groin and scrotum.  Ice packs and reclining will help.  Swelling and bruising can take several days to resolve.  ?6. It is common to experience some constipation if taking pain medication after surgery.  Increasing fluid intake and taking a stool softener (such as Colace) will usually help or prevent this problem from occurring.  A mild laxative (Milk of Magnesia or Miralax) should be taken according to package directions if there are no bowel movements after 48 hours. ?7. Unless discharge instructions indicate otherwise, you may remove your bandages 24-48 hours after surgery, and you may shower at that time.  You may have steri-strips (small skin tapes) in place directly over the incision.  These strips should be left on the skin for 7-10 days.  If your surgeon used skin glue on the incision, you may shower in  24 hours.  The glue will flake off over the next 2-3 weeks.  Any sutures or staples will be removed at the office during your follow-up visit. ?8. ACTIVITIES:  You may resume regular (light) daily activities beginning the next day--such as daily self-care, walking, climbing stairs--gradually increasing activities as tolerated.  You may have sexual intercourse when it is comfortable.  Refrain from any heavy lifting or straining until approved by your doctor. ? ?a.You may drive when you are no longer taking prescription pain medication, you can comfortably wear a seatbelt, and you can safely maneuver your car and apply brakes. ? ?9.You should see your doctor in the office for a follow-up appointment approximately 2-3 weeks after your surgery.  Make sure that you call for this appointment within a day or two after you arrive home to insure a convenient appointment time. ? ?WHEN TO CALL YOUR DOCTOR: ?Fever over 101.0 ?Inability to urinate ?Nausea and/or vomiting ?Extreme swelling or bruising ?Continued bleeding from incision. ?Increased pain, redness, or drainage from the incision ? ?The clinic staff is available to answer your questions during regular business hours.  Please don?t hesitate to call and ask to speak to one of the nurses for clinical concerns.  If you have a medical emergency, go to the nearest emergency room or call 911.  A surgeon from [facility] is always on call at the hospital ? ? ?[address], [address], [place], [place]  [address] ? ? [address] [address], [place], [place]   [address] ?[phone] ?   [phone] ? FAX [phone] ?Web site: [email or web address] ? ?

## 2022-09-22 NOTE — Progress Notes (Signed)
Progress Note  1 Day Post-Op  Subjective: Pt reports incisional abdominal pain that is worse with coughing. Reports some bleeding from incision and also from vagina overnight but possibly hematuria. Passing flatus and denies nausea. Daughter at bedside.   Objective: Vital signs in last 24 hours: Temp:  [97.6 F (36.4 C)-98.2 F (36.8 C)] 97.6 F (36.4 C) (04/18 0444) Pulse Rate:  [64-87] 66 (04/18 0444) Resp:  [10-19] 18 (04/18 0444) BP: (108-161)/(63-116) 110/63 (04/18 0444) SpO2:  [96 %-99 %] 99 % (04/18 0444) Weight:  [108.9 kg] 108.9 kg (04/17 1130) Last BM Date : 09/20/22  Intake/Output from previous day: 04/17 0701 - 04/18 0700 In: 1550 [P.O.:250; I.V.:1000; IV Piggyback:300] Out: 1010 [Urine:1000; Blood:10] Intake/Output this shift: No intake/output data recorded.  PE: General: pleasant, WD, obese female who is laying in bed in NAD Heart: regular, rate, and rhythm.   Lungs: Respiratory effort nonlabored Abd: soft, appropriately ttp, ND, incision with honeycomb and gauze present Psych: A&Ox3 with an appropriate affect.    Lab Results:  Recent Labs    09/21/22 1821 09/22/22 0043  WBC 10.4 8.9  HGB 12.3 11.8*  HCT 38.6 36.1  PLT 212 202   BMET Recent Labs    09/21/22 0104 09/21/22 1821 09/22/22 0043  NA 135  --  134*  K 3.5  --  4.4  CL 106  --  104  CO2 23  --  23  GLUCOSE 112*  --  165*  BUN 15  --  14  CREATININE 0.85 0.86 0.91  CALCIUM 8.7*  --  8.2*   PT/INR No results for input(s): "LABPROT", "INR" in the last 72 hours. CMP     Component Value Date/Time   NA 134 (L) 09/22/2022 0043   K 4.4 09/22/2022 0043   CL 104 09/22/2022 0043   CO2 23 09/22/2022 0043   GLUCOSE 165 (H) 09/22/2022 0043   BUN 14 09/22/2022 0043   CREATININE 0.91 09/22/2022 0043   CALCIUM 8.2 (L) 09/22/2022 0043   PROT 7.4 09/21/2022 0104   ALBUMIN 3.8 09/21/2022 0104   AST 21 09/21/2022 0104   ALT 23 09/21/2022 0104   ALKPHOS 83 09/21/2022 0104   BILITOT 0.2  (L) 09/21/2022 0104   GFRNONAA >60 09/22/2022 0043   GFRAA >60 10/06/2019 1753   Lipase     Component Value Date/Time   LIPASE 28 09/21/2022 0104       Studies/Results: CT ABDOMEN PELVIS W CONTRAST  Result Date: 09/21/2022 CLINICAL DATA:  RLQ abdominal pain EXAM: CT ABDOMEN AND PELVIS WITH CONTRAST TECHNIQUE: Multidetector CT imaging of the abdomen and pelvis was performed using the standard protocol following bolus administration of intravenous contrast. RADIATION DOSE REDUCTION: This exam was performed according to the departmental dose-optimization program which includes automated exposure control, adjustment of the mA and/or kV according to patient size and/or use of iterative reconstruction technique. CONTRAST:  75mL OMNIPAQUE IOHEXOL 350 MG/ML SOLN COMPARISON:  CT AP, 10/06/2019. FINDINGS: Lower chest: No acute abnormality. Hepatobiliary: No focal liver abnormality is seen. No gallstones, gallbladder wall thickening, or biliary dilatation. Pancreas: No pancreatic ductal dilatation or surrounding inflammatory changes. Spleen: Normal in size without focal abnormality. Small perihilar accessory spleen. Adrenals/Urinary Tract: Adrenal glands are unremarkable. Kidneys are normal, without renal calculi, focal lesion, or hydronephrosis. Bladder is unremarkable. Stomach/Bowel: Stomach is within normal limits. Appendix appears normal. No evidence of bowel wall thickening, distention, or inflammatory changes. Focal outpouching of distal small bowel anti-mesenteric loop of the RIGHT inferolateral abdomen (  see key image). No evidence of obstruction or vascular compromise to bowel Vascular/Lymphatic: No significant vascular findings are present. No enlarged abdominal or pelvic lymph nodes. Reproductive: Status post hysterectomy. No adnexal mass. Other: Trace superior abdominal mesenteric stranding with a few prominent though nonenlarged lymph nodes, consistent with mesenteric adenitis. Lower anterior  abdominal incisional hernia with small fascial defect. No abdominopelvic ascites. Musculoskeletal: No acute or significant osseous findings. IMPRESSION: 1. Postsurgical changes of hysterectomy with lower anterior abdominal incisional hernia. Focal outpouching of distal small bowel anti-mesenteric loop favored consistent with a Richter hernia. No evidence of obstruction or vascular compromise to bowel. 2. Mesenteric adenitis, incidental and self limiting. Electronically Signed   By: Roanna Banning M.D.   On: 09/21/2022 05:54    Anti-infectives: Anti-infectives (From admission, onward)    Start     Dose/Rate Route Frequency Ordered Stop   09/21/22 1130  ciprofloxacin (CIPRO) IVPB 400 mg        400 mg 200 mL/hr over 60 Minutes Intravenous On call to O.R. 09/21/22 1123 09/21/22 1231   09/21/22 1125  ciprofloxacin (CIPRO) 400 MG/200ML IVPB       Note to Pharmacy: Payton Emerald A: cabinet override      09/21/22 1125 09/21/22 1249        Assessment/Plan Incarcerated incisional hernia POD1 s/p diagnostic laparoscopy, open primary hernia repair, small bowel serosal repairs Dr. Magnus Ivan - tolerating diet - abdominal binder when OOB - DC foley today  - mobilize - possibly home tomorrow if doing well  - monitor hematuria vs vaginal bleeding, hgb 11.8 from 12.3  FEN: reg diet, SLIV VTE: LMWH ID: cipro pre-op  T2DM - SSI HTN HLD Migraines  LOS: 1 day     Juliet Rude, [facility] 09/22/2022, 8:28 AM Please see Amion for pager number during day hours 7:00am-4:30pm

## 2022-09-22 NOTE — Progress Notes (Signed)
Orthopedic Tech Progress Note Patient Details:  Linda Thomas February 10, 1981 [id number]  Ortho Devices Type of Ortho Device: Abdominal binder Ortho Device/Splint Location: STOMACH Ortho Device/Splint Interventions: Ordered   Post Interventions Patient Tolerated: Well Instructions Provided: Care of device  [name] 09/22/2022, 5:36 PM

## 2022-09-22 NOTE — TOC CM/SW Note (Signed)
  Transition of Care [facility]) Screening Note   Patient Details  Name: Linda Thomas Date of Birth: 03-31-1981   Transition of Care Department [facility] has reviewed patient and no TOC needs have been identified at this time. We will continue to monitor patient advancement through interdisciplinary progression rounds. If new patient transition needs arise, please place a TOC consult.

## 2022-09-23 LAB — BASIC METABOLIC PANEL
Anion gap: 7 (ref 5–15)
BUN: 20 mg/dL (ref 6–20)
CO2: 25 mmol/L (ref 22–32)
Calcium: 8.2 mg/dL — ABNORMAL LOW (ref 8.9–10.3)
Chloride: 107 mmol/L (ref 98–111)
Creatinine, Ser: 0.82 mg/dL (ref 0.44–1.00)
GFR, Estimated: >60 mL/min (ref >60)
Glucose, Bld: 92 mg/dL (ref 70–99)
Potassium: 4 mmol/L (ref 3.5–5.1)
Sodium: 139 mmol/L (ref 135–145)

## 2022-09-23 LAB — CBC
HCT: 34.5 % — ABNORMAL LOW (ref 36.0–46.0)
Hemoglobin: 10.4 g/dL — ABNORMAL LOW (ref 12.0–15.0)
MCH: 26.9 pg (ref 26.0–34.0)
MCHC: 30.1 g/dL (ref 30.0–36.0)
MCV: 89.1 fL (ref 80.0–100.0)
Platelets: 174 10*3/uL (ref 150–400)
RBC: 3.87 MIL/uL (ref 3.87–5.11)
RDW: 14.2 % (ref 11.5–15.5)
WBC: 6 10*3/uL (ref 4.0–10.5)
nRBC: 0 % (ref 0.0–0.2)

## 2022-09-23 LAB — GLUCOSE, CAPILLARY
Glucose-Capillary: 117 mg/dL — ABNORMAL HIGH (ref 70–99)
Glucose-Capillary: 96 mg/dL (ref 70–99)
Glucose-Capillary: 97 mg/dL (ref 70–99)
Glucose-Capillary: 120 mg/dL — ABNORMAL HIGH (ref 70–99)

## 2022-09-23 MED ORDER — PANTOPRAZOLE SODIUM 40 MG PO TBEC
40.0000 mg | DELAYED_RELEASE_TABLET | Freq: Every day | ORAL | Status: DC
  Administered 2022-09-23 – 2022-09-25 (×3): 40 mg via ORAL
  Filled 2022-09-23 (×3): qty 1

## 2022-09-23 MED ORDER — HYDROMORPHONE HCL 1 MG/ML IJ SOLN
0.5000 mg | INTRAMUSCULAR | Status: DC | PRN
  Administered 2022-09-23 – 2022-09-25 (×2): 0.5 mg via INTRAVENOUS
  Filled 2022-09-23 (×2): qty 0.5

## 2022-09-23 NOTE — Progress Notes (Signed)
Progress Note  2 Days Post-Op  Subjective: Pt reports she is not feeling well this AM. Passing some flatus but only a small amount. Feels full and not hungry but denies nausea specifically or vomiting. She got up to the bathroom yesterday but abdomen felt heavy, binder delivered but they never put it on her yesterday. Hematuria resolved but she has not peed much since foley removal.   Objective: Vital signs in last 24 hours: Temp:  [98 F (36.7 C)-98.3 F (36.8 C)] 98 F (36.7 C) (04/19 0818) Pulse Rate:  [62-85] 72 (04/19 0818) Resp:  [17-18] 17 (04/19 0818) BP: (111-135)/(65-72) 123/72 (04/19 0818) SpO2:  [98 %-100 %] 98 % (04/19 0818) Last BM Date : 09/20/22  Intake/Output from previous day: 04/18 0701 - 04/19 0700 In: 2074.1 [P.O.:540; I.V.:1534.1] Out: 600 [Urine:600] Intake/Output this shift: No intake/output data recorded.  PE: General: pleasant, WD, obese female who is laying in bed in NAD Heart: regular, rate, and rhythm.   Lungs: Respiratory effort nonlabored Abd: soft, appropriately ttp, moderate distention, incision with honeycomb and gauze present Psych: A&Ox3 with an appropriate affect.    Lab Results:  Recent Labs    09/21/22 1821 09/22/22 0043  WBC 10.4 8.9  HGB 12.3 11.8*  HCT 38.6 36.1  PLT 212 202    BMET Recent Labs    09/21/22 0104 09/21/22 1821 09/22/22 0043  NA 135  --  134*  K 3.5  --  4.4  CL 106  --  104  CO2 23  --  23  GLUCOSE 112*  --  165*  BUN 15  --  14  CREATININE 0.85 0.86 0.91  CALCIUM 8.7*  --  8.2*    PT/INR No results for input(s): "LABPROT", "INR" in the last 72 hours. CMP     Component Value Date/Time   NA 134 (L) 09/22/2022 0043   K 4.4 09/22/2022 0043   CL 104 09/22/2022 0043   CO2 23 09/22/2022 0043   GLUCOSE 165 (H) 09/22/2022 0043   BUN 14 09/22/2022 0043   CREATININE 0.91 09/22/2022 0043   CALCIUM 8.2 (L) 09/22/2022 0043   PROT 7.4 09/21/2022 0104   ALBUMIN 3.8 09/21/2022 0104   AST 21  09/21/2022 0104   ALT 23 09/21/2022 0104   ALKPHOS 83 09/21/2022 0104   BILITOT 0.2 (L) 09/21/2022 0104   GFRNONAA >60 09/22/2022 0043   GFRAA >60 10/06/2019 1753   Lipase     Component Value Date/Time   LIPASE 28 09/21/2022 0104       Studies/Results: No results found.  Anti-infectives: Anti-infectives (From admission, onward)    Start     Dose/Rate Route Frequency Ordered Stop   09/21/22 1130  ciprofloxacin (CIPRO) IVPB 400 mg        400 mg 200 mL/hr over 60 Minutes Intravenous On call to O.R. 09/21/22 1123 09/21/22 1231   09/21/22 1125  ciprofloxacin (CIPRO) 400 MG/200ML IVPB       Note to Pharmacy: Payton Emerald A: cabinet override      09/21/22 1125 09/21/22 1249        Assessment/Plan Incarcerated incisional hernia POD2 s/p diagnostic laparoscopy, open primary hernia repair, small bowel serosal repairs Dr. Magnus Ivan - drop back to FLD, if not tolerating this today may need KUB - abdominal binder when OOB - needs to mobilize more today  - check CBC and BMET - not ready for DC until tolerating diet better and mobilizing more   FEN: FLD, LR /h  VTE: LMWH ID: cipro pre-op  T2DM - SSI HTN HLD Migraines  LOS: 2 days     Juliet Rude, [facility] Surgery 09/23/2022, 9:17 AM Please see Amion for pager number during day hours 7:00am-4:30pm

## 2022-09-24 DIAGNOSIS — K430 Incisional hernia with obstruction, without gangrene: Secondary | ICD-10-CM | POA: Diagnosis not present

## 2022-09-24 DIAGNOSIS — E119 Type 2 diabetes mellitus without complications: Secondary | ICD-10-CM | POA: Diagnosis not present

## 2022-09-24 LAB — GLUCOSE, CAPILLARY
Glucose-Capillary: 88 mg/dL (ref 70–99)
Glucose-Capillary: 98 mg/dL (ref 70–99)
Glucose-Capillary: 107 mg/dL — ABNORMAL HIGH (ref 70–99)
Glucose-Capillary: 131 mg/dL — ABNORMAL HIGH (ref 70–99)

## 2022-09-24 NOTE — Progress Notes (Signed)
   Progress Note  3 Days Post-Op  Subjective: Pt reports feeling better this AM, passing small amount of flatus still but no BM. Appetite still low but no nausea or vomiting. UOP good.   Objective: Vital signs in last 24 hours: Temp:  [98 F (36.7 C)-98.7 F (37.1 C)] 98.1 F (36.7 C) (04/20 0823) Pulse Rate:  [66-82] 71 (04/20 0823) Resp:  [17-18] 17 (04/20 0823) BP: (124-184)/(74-99) 124/83 (04/20 0823) SpO2:  [97 %-100 %] 98 % (04/20 0823) Last BM Date : 09/20/22  Intake/Output from previous day: 04/19 0701 - 04/20 0700 In: 1225.6 [P.O.:240; I.V.:985.6] Out: -  Intake/Output this shift: No intake/output data recorded.  PE: General: pleasant, WD, obese female who is laying in bed in NAD Heart: regular, rate, and rhythm.   Lungs: Respiratory effort nonlabored Abd: soft, appropriately ttp, moderate distention, incisions C/D/I with staples to midline, +BS Psych: A&Ox3 with an appropriate affect.    Lab Results:  Recent Labs    09/22/22 0043 09/23/22 1231  WBC 8.9 6.0  HGB 11.8* 10.4*  HCT 36.1 34.5*  PLT 202 174    BMET Recent Labs    09/22/22 0043 09/23/22 1231  NA 134* 139  K 4.4 4.0  CL 104 107  CO2 23 25  GLUCOSE 165* 92  BUN 14 20  CREATININE 0.91 0.82  CALCIUM 8.2* 8.2*    PT/INR No results for input(s): "LABPROT", "INR" in the last 72 hours. CMP     Component Value Date/Time   NA 139 09/23/2022 1231   K 4.0 09/23/2022 1231   CL 107 09/23/2022 1231   CO2 25 09/23/2022 1231   GLUCOSE 92 09/23/2022 1231   BUN 20 09/23/2022 1231   CREATININE 0.82 09/23/2022 1231   CALCIUM 8.2 (L) 09/23/2022 1231   PROT 7.4 09/21/2022 0104   ALBUMIN 3.8 09/21/2022 0104   AST 21 09/21/2022 0104   ALT 23 09/21/2022 0104   ALKPHOS 83 09/21/2022 0104   BILITOT 0.2 (L) 09/21/2022 0104   GFRNONAA >60 09/23/2022 1231   GFRAA >60 10/06/2019 1753   Lipase     Component Value Date/Time   LIPASE 28 09/21/2022 0104       Studies/Results: No results  found.  Anti-infectives: Anti-infectives (From admission, onward)    Start     Dose/Rate Route Frequency Ordered Stop   09/21/22 1130  ciprofloxacin (CIPRO) IVPB 400 mg        400 mg 200 mL/hr over 60 Minutes Intravenous On call to O.R. 09/21/22 1123 09/21/22 1231   09/21/22 1125  ciprofloxacin (CIPRO) 400 MG/200ML IVPB       Note to Pharmacy: Payton Emerald A: cabinet override      09/21/22 1125 09/21/22 1249        Assessment/Plan Incarcerated incisional hernia POD3 s/p diagnostic laparoscopy, open primary hernia repair, small bowel serosal repairs Dr. Magnus Ivan - continue FLD - abdominal binder when OOB - needs to mobilize more today  - check CBC and BMET tomorrow AM - not ready for DC until tolerating diet better and mobilizing more   FEN: FLD, LR /h VTE: LMWH ID: cipro pre-op  T2DM - SSI HTN HLD Migraines  LOS: 3 days     Juliet Rude, [facility] Surgery 09/24/2022, 10:45 AM Please see Amion for pager number during day hours 7:00am-4:30pm

## 2022-09-24 NOTE — Progress Notes (Signed)
Pt ambulated a full lap on floor

## 2022-09-24 NOTE — Progress Notes (Signed)
Obtained this pt at 15:00 for rest of shift (19:00). Pt is resting comfortably in bed with no complaints of pain. Pt needs are met, family at bedside.

## 2022-09-25 LAB — BASIC METABOLIC PANEL
Anion gap: 7 (ref 5–15)
BUN: 11 mg/dL (ref 6–20)
CO2: 27 mmol/L (ref 22–32)
Calcium: 8.5 mg/dL — ABNORMAL LOW (ref 8.9–10.3)
Chloride: 102 mmol/L (ref 98–111)
Creatinine, Ser: 0.79 mg/dL (ref 0.44–1.00)
GFR, Estimated: >60 mL/min (ref >60)
Glucose, Bld: 118 mg/dL — ABNORMAL HIGH (ref 70–99)
Potassium: 3.8 mmol/L (ref 3.5–5.1)
Sodium: 136 mmol/L (ref 135–145)

## 2022-09-25 LAB — GLUCOSE, CAPILLARY
Glucose-Capillary: 103 mg/dL — ABNORMAL HIGH (ref 70–99)
Glucose-Capillary: 93 mg/dL (ref 70–99)
Glucose-Capillary: 101 mg/dL — ABNORMAL HIGH (ref 70–99)
Glucose-Capillary: 82 mg/dL (ref 70–99)

## 2022-09-25 LAB — CBC
HCT: 33.9 % — ABNORMAL LOW (ref 36.0–46.0)
Hemoglobin: 11 g/dL — ABNORMAL LOW (ref 12.0–15.0)
MCH: 27.4 pg (ref 26.0–34.0)
MCHC: 32.4 g/dL (ref 30.0–36.0)
MCV: 84.5 fL (ref 80.0–100.0)
Platelets: 192 10*3/uL (ref 150–400)
RBC: 4.01 MIL/uL (ref 3.87–5.11)
RDW: 13.5 % (ref 11.5–15.5)
WBC: 5.5 10*3/uL (ref 4.0–10.5)
nRBC: 0 % (ref 0.0–0.2)

## 2022-09-25 MED ORDER — POLYETHYLENE GLYCOL 3350 17 G PO PACK
17.0000 g | PACK | Freq: Every day | ORAL | Status: DC
  Administered 2022-09-25 – 2022-09-26 (×2): 17 g via ORAL
  Filled 2022-09-25 (×2): qty 1

## 2022-09-25 NOTE — Progress Notes (Signed)
   Progress Note  4 Days Post-Op  Subjective: Pt passing flatus and had a BM yesterday. Denies nausea or vomiting. A little more pain overnight, we discussed pain management.   Objective: Vital signs in last 24 hours: Temp:  [97.9 F (36.6 C)-98.7 F (37.1 C)] 97.9 F (36.6 C) (04/21 0815) Pulse Rate:  [72-86] 75 (04/21 0815) Resp:  [16-18] 16 (04/21 0815) BP: (138-165)/(78-88) 138/78 (04/21 0815) SpO2:  [96 %-100 %] 100 % (04/21 0815) Last BM Date : 09/20/22  Intake/Output from previous day: 04/20 0701 - 04/21 0700 In: 2641.6 [P.O.:920; I.V.:1721.6] Out: -  Intake/Output this shift: No intake/output data recorded.  PE: General: pleasant, WD, obese female who is laying in bed in NAD Heart: regular, rate, and rhythm.   Lungs: Respiratory effort nonlabored Abd: soft, appropriately ttp, moderate distention, incisions C/D/I with staples to midline, +BS Psych: A&Ox3 with an appropriate affect.    Lab Results:  Recent Labs    09/23/22 1231 09/25/22 0330  WBC 6.0 5.5  HGB 10.4* 11.0*  HCT 34.5* 33.9*  PLT 174 192    BMET Recent Labs    09/23/22 1231 09/25/22 0330  NA 139 136  K 4.0 3.8  CL 107 102  CO2 25 27  GLUCOSE 92 118*  BUN 20 11  CREATININE 0.82 0.79  CALCIUM 8.2* 8.5*    PT/INR No results for input(s): "LABPROT", "INR" in the last 72 hours. CMP     Component Value Date/Time   NA 136 09/25/2022 0330   K 3.8 09/25/2022 0330   CL 102 09/25/2022 0330   CO2 27 09/25/2022 0330   GLUCOSE 118 (H) 09/25/2022 0330   BUN 11 09/25/2022 0330   CREATININE 0.79 09/25/2022 0330   CALCIUM 8.5 (L) 09/25/2022 0330   PROT 7.4 09/21/2022 0104   ALBUMIN 3.8 09/21/2022 0104   AST 21 09/21/2022 0104   ALT 23 09/21/2022 0104   ALKPHOS 83 09/21/2022 0104   BILITOT 0.2 (L) 09/21/2022 0104   GFRNONAA >60 09/25/2022 0330   GFRAA >60 10/06/2019 1753   Lipase     Component Value Date/Time   LIPASE 28 09/21/2022 0104       Studies/Results: No results  found.  Anti-infectives: Anti-infectives (From admission, onward)    Start     Dose/Rate Route Frequency Ordered Stop   09/21/22 1130  ciprofloxacin (CIPRO) IVPB 400 mg        400 mg 200 mL/hr over 60 Minutes Intravenous On call to O.R. 09/21/22 1123 09/21/22 1231   09/21/22 1125  ciprofloxacin (CIPRO) 400 MG/200ML IVPB       Note to Pharmacy: Payton Emerald A: cabinet override      09/21/22 1125 09/21/22 1249        Assessment/Plan Incarcerated incisional hernia POD4 s/p diagnostic laparoscopy, open primary hernia repair, small bowel serosal repairs Dr. Magnus Ivan - advance to soft diet  - abdominal binder when OOB - continue to mobilize - ok to shower today - possible discharge tomorrow if continuing to do well   FEN: soft diet, SLIV VTE: LMWH ID: cipro pre-op  T2DM - SSI HTN HLD Migraines  LOS: 4 days     Juliet Rude, [facility] Surgery 09/25/2022, 9:22 AM Please see Amion for pager number during day hours 7:00am-4:30pm

## 2022-09-25 NOTE — Plan of Care (Signed)

## 2022-09-26 DIAGNOSIS — E119 Type 2 diabetes mellitus without complications: Secondary | ICD-10-CM | POA: Diagnosis not present

## 2022-09-26 DIAGNOSIS — K430 Incisional hernia with obstruction, without gangrene: Secondary | ICD-10-CM | POA: Diagnosis not present

## 2022-09-26 LAB — GLUCOSE, CAPILLARY
Glucose-Capillary: 99 mg/dL (ref 70–99)
Glucose-Capillary: 130 mg/dL — ABNORMAL HIGH (ref 70–99)

## 2022-09-26 MED ORDER — GLUCERNA SHAKE PO LIQD
237.0000 mL | Freq: Three times a day (TID) | ORAL | Status: DC
  Administered 2022-09-26: 237 mL via ORAL

## 2022-09-26 MED ORDER — METHOCARBAMOL 500 MG PO TABS: 500.0000 mg | ORAL_TABLET | Freq: Four times a day (QID) | ORAL | 0 refills | Status: DC | PRN

## 2022-09-26 MED ORDER — LACTATED RINGERS IV BOLUS
500.0000 mL | Freq: Once | INTRAVENOUS | Status: AC
  Administered 2022-09-26: 500 mL via INTRAVENOUS

## 2022-09-26 MED ORDER — POLYETHYLENE GLYCOL 3350 17 G PO PACK: 17.0000 g | PACK | Freq: Every day | ORAL | 0 refills | Status: DC | PRN

## 2022-09-26 MED ORDER — OXYCODONE HCL 5 MG PO TABS: 5.0000 mg | ORAL_TABLET | Freq: Four times a day (QID) | ORAL | 0 refills | Status: DC | PRN

## 2022-09-26 MED ORDER — ACETAMINOPHEN 500 MG PO TABS: 1000.0000 mg | ORAL_TABLET | Freq: Four times a day (QID) | ORAL | 0 refills | Status: AC | PRN

## 2022-09-26 MED ORDER — DOCUSATE SODIUM 100 MG PO CAPS: 100.0000 mg | ORAL_CAPSULE | Freq: Two times a day (BID) | ORAL | 0 refills | Status: DC

## 2022-09-26 NOTE — Progress Notes (Signed)
   09/26/22 1312  Mobility  Activity Ambulated independently in hallway  Level of Assistance Independent  Assistive Device None  Distance Ambulated (ft) 250 ft  Activity Response Tolerated well  Mobility Referral Yes  $Mobility charge 1 Mobility   Mobility Specialist Progress Note  Post-Mobility: 163/108BP  Pt was in bed and agreeable. Had c/o of lightheadedness during ambulation. Returned to bed w/ all needs met and call bell in reach. RN notified   Anastasia Pall Mobility Specialist  Please contact via SecureChat or Rehab office at [phone]

## 2022-09-26 NOTE — Progress Notes (Signed)
Discharge instructions given to pt. Pt verbalized understanding of all teaching and had no further questions. IV removed.

## 2022-09-26 NOTE — Progress Notes (Signed)
Pt discharged to home with family via wheelchair with all belongings and paperwork. Work note given to pt prior to discharge

## 2022-09-26 NOTE — Discharge Summary (Signed)
[facility] Discharge Summary   Patient ID: Linda Thomas MRN: [id number] DOB/AGE: 07-03-1980 42 y.o.  Admit date: 09/21/2022 Discharge date: 09/26/2022  Admitting Diagnosis: Incarcerated ventral incisional hernia  Discharge Diagnosis Patient Active Problem List   Diagnosis Date Noted   Incarcerated hernia 09/21/2022   S/P TAH (total abdominal hysterectomy) 09/03/2019   BMI 40.0-44.9, adult 04/01/2019   Essential hypertension 06/21/2016    Consultants None  Imaging: No results found.  Procedures Dr. Abigail Miyamoto 09/21/22 DIAGNOSTIC LAPAROSCOPY OPEN HERNIA REPAIR INCISIONAL     Hospital Course:  42 y/o F who presented to the ED with abdominal pain.  Workup showed an incarcerated incisonal hernia.  Patient was admitted and underwent procedure listed above.  Tolerated procedure well and was transferred to the floor.  Diet was advanced as tolerated.  She did have a post-operative ileus that resolved with time, without an NGT. On POD#5, the patient was voiding well, tolerating diet, ambulating well, pain well controlled, vital signs stable, incisions c/d/i and felt stable for discharge home.  Patient will follow up in our office as below and knows to call with questions or concerns.    Work note provided.     Allergies as of 09/26/2022       Reactions   Other Itching   Reaction to unknown antibiotic at age 42   Penicillins Itching   Has patient had a PCN reaction causing immediate rash, facial/tongue/throat swelling, SOB or lightheadedness with hypotension: No Has patient had a PCN reaction causing severe rash involving mucus membranes or skin necrosis: No Has patient had a PCN reaction that required hospitalization: No Has patient had a PCN reaction occurring within the last 10 years: No If all of the above answers are "NO", then may proceed with Cephalosporin use.        Medication List     STOP taking these medications     acetaZOLAMIDE 250 MG tablet Commonly known as: DIAMOX   furosemide 20 MG tablet Commonly known as: LASIX   lisinopril 5 MG tablet Commonly known as: ZESTRIL       TAKE these medications    acetaminophen 500 MG tablet Commonly known as: TYLENOL Take 2 tablets (1,000 mg total) by mouth every 6 (six) hours as needed for mild pain.   docusate sodium 100 MG capsule Commonly known as: COLACE Take 1 capsule (100 mg total) by mouth 2 (two) times daily.   ibuprofen 800 MG tablet Commonly known as: ADVIL Take 1 tablet (800 mg total) by mouth 3 (three) times daily with meals as needed for headache, mild pain, moderate pain or cramping.   labetalol 100 MG tablet Commonly known as: NORMODYNE Take 100 mg by mouth 2 (two) times daily.   losartan 100 MG tablet Commonly known as: COZAAR Take 100 mg by mouth daily.   metFORMIN 500 MG 24 hr tablet Commonly known as: GLUCOPHAGE-XR Take 500 mg by mouth every evening.   methocarbamol 500 MG tablet Commonly known as: ROBAXIN Take 1 tablet (500 mg total) by mouth every 6 (six) hours as needed for muscle spasms.   oxyCODONE 5 MG immediate release tablet Commonly known as: Oxy IR/ROXICODONE Take 1 tablet (5 mg total) by mouth every 6 (six) hours as needed for severe pain.   polyethylene glycol 17 g packet Commonly known as: MIRALAX / GLYCOLAX Take 17 g by mouth daily as needed for mild constipation.   rosuvastatin 10 MG tablet Commonly known as: CRESTOR Take 10 mg by mouth  daily.   Rybelsus 3 MG Tabs Generic drug: Semaglutide Take 1 tablet by mouth every morning.          Follow-up Information     Abigail Miyamoto, MD. Go on 10/21/2022.   Specialty: General Surgery Why: 9:20 AM, please arrive 15 min prior to appointment time to check in. Contact information: [address] [place] [address] [phone]         Surgery, [place]. Go on 10/05/2022.   Specialty: General Surgery Why: 9 AM RN  visit for staple removal. Please arrive 30 min prior to appointment time. Contact information: [address] [place] [address] [phone]                 Signed: Hosie Spangle, [facility] Surgery 09/26/2022, 3:31 PM

## 2022-09-26 NOTE — Progress Notes (Signed)
[facility] Progress Note  5 Days Post-Op  Subjective: CC-  Abdomen sore but pain well controlled. Denies n/v. Passing flatus, last BM 2 days ago. Tolerating diet but not eating or drinking a lot. Feeling dizzy when she gets OOB, states this has been going on for a couple days. Denies CP or SOB. BP has been soft and HTN meds held.  Objective: Vital signs in last 24 hours: Temp:  [98 F (36.7 C)-98.5 F (36.9 C)] 98.2 F (36.8 C) (04/22 0542) Pulse Rate:  [75-86] 75 (04/22 0829) Resp:  [15-17] 17 (04/22 0829) BP: (101-169)/(58-97) 101/58 (04/22 0829) SpO2:  [94 %-98 %] 97 % (04/22 0829) Last BM Date : 09/20/22  Intake/Output from previous day: 04/21 0701 - 04/22 0700 In: 232.1 [I.V.:232.1] Out: -  Intake/Output this shift: No intake/output data recorded.  PE: Gen:  Alert, NAD, pleasant Abd: soft, ND, appropriately tender over incision, incision cdi with staples present and no erythema or drainage  Lab Results:  Recent Labs    09/23/22 1231 09/25/22 0330  WBC 6.0 5.5  HGB 10.4* 11.0*  HCT 34.5* 33.9*  PLT 174 192   BMET Recent Labs    09/23/22 1231 09/25/22 0330  NA 139 136  K 4.0 3.8  CL 107 102  CO2 25 27  GLUCOSE 92 118*  BUN 20 11  CREATININE 0.82 0.79  CALCIUM 8.2* 8.5*   PT/INR No results for input(s): "LABPROT", "INR" in the last 72 hours. CMP     Component Value Date/Time   NA 136 09/25/2022 0330   K 3.8 09/25/2022 0330   CL 102 09/25/2022 0330   CO2 27 09/25/2022 0330   GLUCOSE 118 (H) 09/25/2022 0330   BUN 11 09/25/2022 0330   CREATININE 0.79 09/25/2022 0330   CALCIUM 8.5 (L) 09/25/2022 0330   PROT 7.4 09/21/2022 0104   ALBUMIN 3.8 09/21/2022 0104   AST 21 09/21/2022 0104   ALT 23 09/21/2022 0104   ALKPHOS 83 09/21/2022 0104   BILITOT 0.2 (L) 09/21/2022 0104   GFRNONAA >60 09/25/2022 0330   GFRAA >60 10/06/2019 1753   Lipase     Component Value Date/Time   LIPASE 28 09/21/2022 0104       Studies/Results: No  results found.  Anti-infectives: Anti-infectives (From admission, onward)    Start     Dose/Rate Route Frequency Ordered Stop   09/21/22 1130  ciprofloxacin (CIPRO) IVPB 400 mg        400 mg 200 mL/hr over 60 Minutes Intravenous On call to O.R. 09/21/22 1123 09/21/22 1231   09/21/22 1125  ciprofloxacin (CIPRO) 400 MG/200ML IVPB       Note to Pharmacy: Payton Emerald A: cabinet override      09/21/22 1125 09/21/22 1249        Assessment/Plan Incarcerated incisional hernia POD5 s/p diagnostic laparoscopy, open primary hernia repair, small bowel serosal repairs Dr. Magnus Ivan - check orthostatic vital signs, will give 500cc bolus, encourage PO intake. Possible discharge later today if dizziness improves - continue soft diet, bowel regimen - abdominal binder when OOB - continue to mobilize - ok to shower   FEN: soft diet, SLIV VTE: LMWH ID: cipro pre-op   T2DM - SSI HTN HLD Migraines    LOS: 5 days    Franne Forts, [facility] Surgery 09/26/2022, 9:46 AM Please see Amion for pager number during day hours 7:00am-4:30pm

## 2022-09-26 NOTE — Progress Notes (Signed)
Pt given oxycodone for pain, next dose can be given at 7:11 pm, pt made aware.

## 2022-10-21 DIAGNOSIS — K430 Incisional hernia with obstruction, without gangrene: Secondary | ICD-10-CM | POA: Diagnosis not present

## 2022-10-24 DIAGNOSIS — E119 Type 2 diabetes mellitus without complications: Secondary | ICD-10-CM | POA: Diagnosis not present

## 2022-10-24 DIAGNOSIS — I119 Hypertensive heart disease without heart failure: Secondary | ICD-10-CM | POA: Diagnosis not present

## 2022-10-24 DIAGNOSIS — R21 Rash and other nonspecific skin eruption: Secondary | ICD-10-CM | POA: Diagnosis not present

## 2023-03-22 DIAGNOSIS — M79642 Pain in left hand: Secondary | ICD-10-CM | POA: Diagnosis not present

## 2023-03-22 DIAGNOSIS — M79641 Pain in right hand: Secondary | ICD-10-CM | POA: Diagnosis not present

## 2023-05-23 DIAGNOSIS — K056 Periodontal disease, unspecified: Secondary | ICD-10-CM | POA: Diagnosis not present

## 2023-05-24 DIAGNOSIS — G5613 Other lesions of median nerve, bilateral upper limbs: Secondary | ICD-10-CM | POA: Diagnosis not present

## 2023-05-26 DIAGNOSIS — M79642 Pain in left hand: Secondary | ICD-10-CM | POA: Diagnosis not present

## 2023-05-26 DIAGNOSIS — M79641 Pain in right hand: Secondary | ICD-10-CM | POA: Diagnosis not present

## 2023-05-26 DIAGNOSIS — G5603 Carpal tunnel syndrome, bilateral upper limbs: Secondary | ICD-10-CM | POA: Diagnosis not present

## 2023-06-09 ENCOUNTER — Ambulatory Visit: Payer: Medicaid Other | Admitting: Nurse Practitioner

## 2023-06-09 VITALS — BP 128/84 | HR 85 | Resp 16 | Ht 63.0 in | Wt 242.8 lb

## 2023-06-09 DIAGNOSIS — Z6841 Body Mass Index (BMI) 40.0 and over, adult: Secondary | ICD-10-CM

## 2023-06-09 DIAGNOSIS — E1165 Type 2 diabetes mellitus with hyperglycemia: Secondary | ICD-10-CM

## 2023-06-09 DIAGNOSIS — Z13220 Encounter for screening for lipoid disorders: Secondary | ICD-10-CM

## 2023-06-09 DIAGNOSIS — I10 Essential (primary) hypertension: Secondary | ICD-10-CM | POA: Diagnosis not present

## 2023-06-09 NOTE — Progress Notes (Signed)
 BP 128/84   Pulse 85   Resp 16   Ht 5' 3 (1.6 m)   Wt 242 lb 12.8 oz (110.1 kg)   LMP 07/25/2019 (Within Weeks)   SpO2 99%   BMI 43.01 kg/m    Subjective:    Patient ID: Linda Thomas, female    DOB: 12/26/1980, 43 y.o.   MRN: [id number]  HPI: Linda Thomas is a 43 y.o. female  Chief Complaint  Patient presents with   Establish Care    Discussed the use of AI scribe software for clinical note transcription with the patient, who gave verbal consent to proceed.  History of Present Illness   The patient, with a history of hypertension and type 2 diabetes, presents to establish care. She admits to poor medication adherence, taking labetalol  'every once in a while' and ibuprofen  and Tylenol  as needed. She has not taken losartan , metformin, Robaxin , rosuvastatin , or Rybelsus . She reports no complications from diabetes and her last A1c in April was 6.3. She has not checked her blood sugar recently and does not know when her last cholesterol check was. She had an eye exam last year. She denies any other medical history.       06/09/2023   11:58 AM 10/17/2019   10:01 AM 09/20/2019    9:11 AM  Depression screen PHQ 2/9  Decreased Interest 0 1 1  Down, Depressed, Hopeless 0 0 0  PHQ - 2 Score 0 1 1  Altered sleeping  1 1  Tired, decreased energy  1 1  Change in appetite  1 1  Feeling bad or failure about yourself   0 0  Trouble concentrating  0 0  Moving slowly or fidgety/restless  0 0  Suicidal thoughts  0 0  PHQ-9 Score  4 4    Relevant past medical, surgical, family and social history reviewed and updated as indicated. Interim medical history since our last visit reviewed. Allergies and medications reviewed and updated.  Review of Systems  Constitutional: Negative for fever or weight change.  Respiratory: Negative for cough and shortness of breath.   Cardiovascular: Negative for chest pain or palpitations.  Gastrointestinal: Negative for abdominal  pain, no bowel changes.  Musculoskeletal: Negative for gait problem or joint swelling.  Skin: Negative for rash.  Neurological: Negative for dizziness or headache.  No other specific complaints in a complete review of systems (except as listed in HPI above).      Objective:    BP 128/84   Pulse 85   Resp 16   Ht 5' 3 (1.6 m)   Wt 242 lb 12.8 oz (110.1 kg)   LMP 07/25/2019 (Within Weeks)   SpO2 99%   BMI 43.01 kg/m    Wt Readings from Last 3 Encounters:  06/09/23 242 lb 12.8 oz (110.1 kg)  09/21/22 240 lb (108.9 kg)  10/17/19 218 lb (98.9 kg)    Physical Exam  Constitutional: Patient appears well-developed and well-nourished. Obese  No distress.  HEENT: head atraumatic, normocephalic, pupils equal and reactive to light, neck supple Cardiovascular: Normal rate, regular rhythm and normal heart sounds.  No murmur heard. No BLE edema. Pulmonary/Chest: Effort normal and breath sounds normal. No respiratory distress. Abdominal: Soft.  There is no tenderness. Psychiatric: Patient has a normal mood and affect. behavior is normal. Judgment and thought content normal.  Results for orders placed or performed during the hospital encounter of 09/21/22  Urinalysis, Routine w reflex microscopic -Urine, Clean Catch  Collection Time: 09/21/22  1:00 AM  Result Value Ref Range   Color, Urine YELLOW YELLOW   APPearance CLEAR CLEAR   Specific Gravity, Urine 1.017 1.005 - 1.030   pH 7.0 5.0 - 8.0   Glucose, UA NEGATIVE NEGATIVE mg/dL   Hgb urine dipstick NEGATIVE NEGATIVE   Bilirubin Urine NEGATIVE NEGATIVE   Ketones, ur NEGATIVE NEGATIVE mg/dL   Protein, ur NEGATIVE NEGATIVE mg/dL   Nitrite NEGATIVE NEGATIVE   Leukocytes,Ua NEGATIVE NEGATIVE  Comprehensive metabolic panel   Collection Time: 09/21/22  1:04 AM  Result Value Ref Range   Sodium 135 135 - 145 mmol/L   Potassium 3.5 3.5 - 5.1 mmol/L   Chloride 106 98 - 111 mmol/L   CO2 23 22 - 32 mmol/L   Glucose, Bld 112 (H) 70 - 99  mg/dL   BUN 15 6 - 20 mg/dL   Creatinine, Ser 9.14 0.44 - 1.00 mg/dL   Calcium  8.7 (L) 8.9 - 10.3 mg/dL   Total Protein 7.4 6.5 - 8.1 g/dL   Albumin  3.8 3.5 - 5.0 g/dL   AST 21 15 - 41 U/L   ALT 23 0 - 44 U/L   Alkaline Phosphatase 83 38 - 126 U/L   Total Bilirubin 0.2 (L) 0.3 - 1.2 mg/dL   GFR, Estimated >39 >39 mL/min   Anion gap 6 5 - 15  Lipase, blood   Collection Time: 09/21/22  1:04 AM  Result Value Ref Range   Lipase 28 11 - 51 U/L  CBC with Diff   Collection Time: 09/21/22  1:04 AM  Result Value Ref Range   WBC 5.9 4.0 - 10.5 K/uL   RBC 4.47 3.87 - 5.11 MIL/uL   Hemoglobin 11.9 (L) 12.0 - 15.0 g/dL   HCT 62.1 63.9 - 53.9 %   MCV 84.6 80.0 - 100.0 fL   MCH 26.6 26.0 - 34.0 pg   MCHC 31.5 30.0 - 36.0 g/dL   RDW 86.4 88.4 - 84.4 %   Platelets 218 150 - 400 K/uL   nRBC 0.0 0.0 - 0.2 %   Neutrophils Relative % 45 %   Neutro Abs 2.6 1.7 - 7.7 K/uL   Lymphocytes Relative 43 %   Lymphs Abs 2.5 0.7 - 4.0 K/uL   Monocytes Relative 10 %   Monocytes Absolute 0.6 0.1 - 1.0 K/uL   Eosinophils Relative 2 %   Eosinophils Absolute 0.1 0.0 - 0.5 K/uL   Basophils Relative 0 %   Basophils Absolute 0.0 0.0 - 0.1 K/uL   Immature Granulocytes 0 %   Abs Immature Granulocytes 0.02 0.00 - 0.07 K/uL  I-Stat beta hCG blood, ED ([facility], [facility], [facility] only)   Collection Time: 09/21/22  1:51 AM  Result Value Ref Range   I-stat hCG, quantitative <5.0 <5 mIU/mL   Comment 3          Lactic acid, plasma   Collection Time: 09/21/22  9:21 AM  Result Value Ref Range   Lactic Acid, Venous 0.8 0.5 - 1.9 mmol/L  Lactic acid, plasma   Collection Time: 09/21/22 11:16 AM  Result Value Ref Range   Lactic Acid, Venous 0.6 0.5 - 1.9 mmol/L  Glucose, capillary   Collection Time: 09/21/22 11:32 AM  Result Value Ref Range   Glucose-Capillary 93 70 - 99 mg/dL   Comment 1 Notify RN   Glucose, capillary   Collection Time: 09/21/22  5:07 PM  Result Value Ref Range   Glucose-Capillary 132 (H) 70 - 99 mg/dL  HIV  Antibody (routine testing w rflx)   Collection Time: 09/21/22  6:21 PM  Result Value Ref Range   HIV Screen 4th Generation wRfx Non Reactive Non Reactive  CBC   Collection Time: 09/21/22  6:21 PM  Result Value Ref Range   WBC 10.4 4.0 - 10.5 K/uL   RBC 4.55 3.87 - 5.11 MIL/uL   Hemoglobin 12.3 12.0 - 15.0 g/dL   HCT 61.3 63.9 - 53.9 %   MCV 84.8 80.0 - 100.0 fL   MCH 27.0 26.0 - 34.0 pg   MCHC 31.9 30.0 - 36.0 g/dL   RDW 86.4 88.4 - 84.4 %   Platelets 212 150 - 400 K/uL   nRBC 0.0 0.0 - 0.2 %  Creatinine, serum   Collection Time: 09/21/22  6:21 PM  Result Value Ref Range   Creatinine, Ser 0.86 0.44 - 1.00 mg/dL   GFR, Estimated >39 >39 mL/min  Hemoglobin A1c   Collection Time: 09/21/22  6:21 PM  Result Value Ref Range   Hgb A1c MFr Bld 6.3 (H) 4.8 - 5.6 %   Mean Plasma Glucose 134.11 mg/dL  Glucose, capillary   Collection Time: 09/21/22  9:43 PM  Result Value Ref Range   Glucose-Capillary 135 (H) 70 - 99 mg/dL  CBC   Collection Time: 09/22/22 12:43 AM  Result Value Ref Range   WBC 8.9 4.0 - 10.5 K/uL   RBC 4.25 3.87 - 5.11 MIL/uL   Hemoglobin 11.8 (L) 12.0 - 15.0 g/dL   HCT 63.8 63.9 - 53.9 %   MCV 84.9 80.0 - 100.0 fL   MCH 27.8 26.0 - 34.0 pg   MCHC 32.7 30.0 - 36.0 g/dL   RDW 86.4 88.4 - 84.4 %   Platelets 202 150 - 400 K/uL   nRBC 0.0 0.0 - 0.2 %  Basic metabolic panel   Collection Time: 09/22/22 12:43 AM  Result Value Ref Range   Sodium 134 (L) 135 - 145 mmol/L   Potassium 4.4 3.5 - 5.1 mmol/L   Chloride 104 98 - 111 mmol/L   CO2 23 22 - 32 mmol/L   Glucose, Bld 165 (H) 70 - 99 mg/dL   BUN 14 6 - 20 mg/dL   Creatinine, Ser 9.08 0.44 - 1.00 mg/dL   Calcium  8.2 (L) 8.9 - 10.3 mg/dL   GFR, Estimated >39 >39 mL/min   Anion gap 7 5 - 15  Glucose, capillary   Collection Time: 09/22/22  7:27 PM  Result Value Ref Range   Glucose-Capillary 180 (H) 70 - 99 mg/dL  Glucose, capillary   Collection Time: 09/22/22  8:19 PM  Result Value Ref Range    Glucose-Capillary 155 (H) 70 - 99 mg/dL  Glucose, capillary   Collection Time: 09/23/22  8:15 AM  Result Value Ref Range   Glucose-Capillary 117 (H) 70 - 99 mg/dL  Glucose, capillary   Collection Time: 09/23/22 12:25 PM  Result Value Ref Range   Glucose-Capillary 96 70 - 99 mg/dL  CBC   Collection Time: 09/23/22 12:31 PM  Result Value Ref Range   WBC 6.0 4.0 - 10.5 K/uL   RBC 3.87 3.87 - 5.11 MIL/uL   Hemoglobin 10.4 (L) 12.0 - 15.0 g/dL   HCT 65.4 (L) 63.9 - 53.9 %   MCV 89.1 80.0 - 100.0 fL   MCH 26.9 26.0 - 34.0 pg   MCHC 30.1 30.0 - 36.0 g/dL   RDW 85.7 88.4 - 84.4 %   Platelets 174 150 - 400 K/uL  nRBC 0.0 0.0 - 0.2 %  Basic metabolic panel   Collection Time: 09/23/22 12:31 PM  Result Value Ref Range   Sodium 139 135 - 145 mmol/L   Potassium 4.0 3.5 - 5.1 mmol/L   Chloride 107 98 - 111 mmol/L   CO2 25 22 - 32 mmol/L   Glucose, Bld 92 70 - 99 mg/dL   BUN 20 6 - 20 mg/dL   Creatinine, Ser 9.17 0.44 - 1.00 mg/dL   Calcium  8.2 (L) 8.9 - 10.3 mg/dL   GFR, Estimated >39 >39 mL/min   Anion gap 7 5 - 15  Glucose, capillary   Collection Time: 09/23/22  5:12 PM  Result Value Ref Range   Glucose-Capillary 97 70 - 99 mg/dL  Glucose, capillary   Collection Time: 09/23/22  9:27 PM  Result Value Ref Range   Glucose-Capillary 120 (H) 70 - 99 mg/dL  Glucose, capillary   Collection Time: 09/24/22  8:23 AM  Result Value Ref Range   Glucose-Capillary 88 70 - 99 mg/dL  Glucose, capillary   Collection Time: 09/24/22 12:01 PM  Result Value Ref Range   Glucose-Capillary 98 70 - 99 mg/dL  Glucose, capillary   Collection Time: 09/24/22  5:05 PM  Result Value Ref Range   Glucose-Capillary 107 (H) 70 - 99 mg/dL  Glucose, capillary   Collection Time: 09/24/22 10:25 PM  Result Value Ref Range   Glucose-Capillary 131 (H) 70 - 99 mg/dL  CBC   Collection Time: 09/25/22  3:30 AM  Result Value Ref Range   WBC 5.5 4.0 - 10.5 K/uL   RBC 4.01 3.87 - 5.11 MIL/uL   Hemoglobin 11.0 (L)  12.0 - 15.0 g/dL   HCT 66.0 (L) 63.9 - 53.9 %   MCV 84.5 80.0 - 100.0 fL   MCH 27.4 26.0 - 34.0 pg   MCHC 32.4 30.0 - 36.0 g/dL   RDW 86.4 88.4 - 84.4 %   Platelets 192 150 - 400 K/uL   nRBC 0.0 0.0 - 0.2 %  Basic metabolic panel   Collection Time: 09/25/22  3:30 AM  Result Value Ref Range   Sodium 136 135 - 145 mmol/L   Potassium 3.8 3.5 - 5.1 mmol/L   Chloride 102 98 - 111 mmol/L   CO2 27 22 - 32 mmol/L   Glucose, Bld 118 (H) 70 - 99 mg/dL   BUN 11 6 - 20 mg/dL   Creatinine, Ser 9.20 0.44 - 1.00 mg/dL   Calcium  8.5 (L) 8.9 - 10.3 mg/dL   GFR, Estimated >39 >39 mL/min   Anion gap 7 5 - 15  Glucose, capillary   Collection Time: 09/25/22  8:18 AM  Result Value Ref Range   Glucose-Capillary 103 (H) 70 - 99 mg/dL  Glucose, capillary   Collection Time: 09/25/22 11:59 AM  Result Value Ref Range   Glucose-Capillary 93 70 - 99 mg/dL  Glucose, capillary   Collection Time: 09/25/22  6:13 PM  Result Value Ref Range   Glucose-Capillary 101 (H) 70 - 99 mg/dL  Glucose, capillary   Collection Time: 09/25/22  8:04 PM  Result Value Ref Range   Glucose-Capillary 82 70 - 99 mg/dL  Glucose, capillary   Collection Time: 09/26/22  8:28 AM  Result Value Ref Range   Glucose-Capillary 99 70 - 99 mg/dL  Glucose, capillary   Collection Time: 09/26/22 11:53 AM  Result Value Ref Range   Glucose-Capillary 130 (H) 70 - 99 mg/dL       Assessment &  Plan:   Problem List Items Addressed This Visit       Cardiovascular and Mediastinum   Essential hypertension - Primary   Relevant Orders   CBC with Differential/Platelet   COMPLETE METABOLIC PANEL WITH GFR     Endocrine   Type 2 diabetes mellitus with hyperglycemia, without long-term current use of insulin  (HCC)   Relevant Orders   Hemoglobin A1c   Microalbumin / creatinine urine ratio     Other   BMI 40.0-44.9, adult (HCC)   Other Visit Diagnoses       Screening for cholesterol level       Relevant Orders   Lipid panel         Assessment and Plan    Hypertension Non-compliance with Labetalol . Patient acknowledges need for improvement in medication adherence. -Encourage consistent use of Labetalol  as prescribed. -Recheck blood pressure at next visit.  Type 2 Diabetes Mellitus Non-compliance with Metformin. Last A1c in April was 6.3. Patient has not been checking blood sugars. -Remove Metformin from medication list due to non-compliance. -Order A1c to assess current glycemic control.  General Health Maintenance -Encourage annual eye exam for diabetes management. -Defer flu shot due to patient feeling unwell.        Obesity -comorbidities include DM, HTN -getting labs, previously on mounjaro , will likely restart.   Follow up plan: Return in about 4 months (around 10/07/2023) for follow up.

## 2023-06-10 LAB — CBC WITH DIFFERENTIAL/PLATELET
Absolute Lymphocytes: 2048 {cells}/uL (ref 850–3900)
Absolute Monocytes: 446 {cells}/uL (ref 200–950)
Basophils Absolute: 32 {cells}/uL (ref 0–200)
Basophils Relative: 0.7 %
Eosinophils Absolute: 171 {cells}/uL (ref 15–500)
Eosinophils Relative: 3.8 %
HCT: 36.8 % (ref 35.0–45.0)
Hemoglobin: 11.7 g/dL (ref 11.7–15.5)
MCH: 27 pg (ref 27.0–33.0)
MCHC: 31.8 g/dL — ABNORMAL LOW (ref 32.0–36.0)
MCV: 85 fL (ref 80.0–100.0)
MPV: 12.1 fL (ref 7.5–12.5)
Monocytes Relative: 9.9 %
Neutro Abs: 1805 {cells}/uL (ref 1500–7800)
Neutrophils Relative %: 40.1 %
Platelets: 233 10*3/uL (ref 140–400)
RBC: 4.33 10*6/uL (ref 3.80–5.10)
RDW: 13.1 % (ref 11.0–15.0)
Total Lymphocyte: 45.5 %
WBC: 4.5 10*3/uL (ref 3.8–10.8)

## 2023-06-10 LAB — COMPLETE METABOLIC PANEL WITH GFR
AG Ratio: 1.5 (calc) (ref 1.0–2.5)
ALT: 19 U/L (ref 6–29)
AST: 17 U/L (ref 10–30)
Albumin: 4.2 g/dL (ref 3.6–5.1)
Alkaline phosphatase (APISO): 85 U/L (ref 31–125)
BUN: 18 mg/dL (ref 7–25)
CO2: 25 mmol/L (ref 20–32)
Calcium: 9 mg/dL (ref 8.6–10.2)
Chloride: 105 mmol/L (ref 98–110)
Creat: 0.72 mg/dL (ref 0.50–0.99)
Globulin: 2.8 g/dL (ref 1.9–3.7)
Glucose, Bld: 82 mg/dL (ref 65–99)
Potassium: 3.9 mmol/L (ref 3.5–5.3)
Sodium: 139 mmol/L (ref 135–146)
Total Bilirubin: 0.3 mg/dL (ref 0.2–1.2)
Total Protein: 7 g/dL (ref 6.1–8.1)
eGFR: 106 mL/min/{1.73_m2} (ref >=60)

## 2023-06-10 LAB — TSH: TSH: 4.18 m[IU]/L

## 2023-06-10 LAB — LIPID PANEL
Cholesterol: 204 mg/dL — ABNORMAL HIGH (ref <200)
HDL: 50 mg/dL (ref >=50)
LDL Cholesterol (Calc): 132 mg/dL — ABNORMAL HIGH
Non-HDL Cholesterol (Calc): 154 mg/dL — ABNORMAL HIGH (ref <130)
Total CHOL/HDL Ratio: 4.1 (calc) (ref <5.0)
Triglycerides: 109 mg/dL (ref <150)

## 2023-06-10 LAB — MICROALBUMIN / CREATININE URINE RATIO
Creatinine, Urine: 184 mg/dL (ref 20–275)
Microalb Creat Ratio: 4 mg/g{creat} (ref <30)
Microalb, Ur: 0.8 mg/dL

## 2023-06-10 LAB — HEMOGLOBIN A1C
Hgb A1c MFr Bld: 6.7 %{Hb} — ABNORMAL HIGH (ref <5.7)
Mean Plasma Glucose: 146 mg/dL
eAG (mmol/L): 8.1 mmol/L

## 2023-06-12 ENCOUNTER — Other Ambulatory Visit: Payer: Self-pay | Admitting: Nurse Practitioner

## 2023-06-12 DIAGNOSIS — E1165 Type 2 diabetes mellitus with hyperglycemia: Secondary | ICD-10-CM

## 2023-06-12 MED ORDER — TIRZEPATIDE 2.5 MG/0.5ML ~~LOC~~ SOAJ
2.5000 mg | SUBCUTANEOUS | 0 refills | Status: DC
Start: 2023-06-12 — End: 2023-06-12

## 2023-06-12 MED ORDER — OZEMPIC (0.25 OR 0.5 MG/DOSE) 2 MG/3ML ~~LOC~~ SOPN
0.2500 mg | PEN_INJECTOR | SUBCUTANEOUS | 0 refills | Status: DC
Start: 2023-06-12 — End: 2023-07-31

## 2023-06-14 ENCOUNTER — Ambulatory Visit: Payer: Medicaid Other | Admitting: Physician Assistant

## 2023-06-14 ENCOUNTER — Encounter: Payer: Self-pay | Admitting: Nurse Practitioner

## 2023-06-15 ENCOUNTER — Ambulatory Visit: Payer: Medicaid Other | Admitting: Physician Assistant

## 2023-06-15 ENCOUNTER — Encounter: Payer: Self-pay | Admitting: Physician Assistant

## 2023-06-15 VITALS — BP 148/92 | HR 86 | Resp 16 | Ht 63.0 in | Wt 241.0 lb

## 2023-06-15 DIAGNOSIS — J069 Acute upper respiratory infection, unspecified: Secondary | ICD-10-CM | POA: Diagnosis not present

## 2023-06-15 MED ORDER — DOXYCYCLINE HYCLATE 100 MG PO TABS: 100.0000 mg | ORAL_TABLET | Freq: Two times a day (BID) | ORAL | 0 refills | Status: AC

## 2023-06-15 MED ORDER — PREDNISONE 20 MG PO TABS: 40.0000 mg | ORAL_TABLET | Freq: Every day | ORAL | 0 refills | Status: AC

## 2023-06-15 NOTE — Patient Instructions (Signed)
 VISIT SUMMARY:  You were seen today for a week-long history of cold-like symptoms, including a persistent cough, shortness of breath, wheezing, and other related symptoms. We discussed your current condition and treatment options to help manage your symptoms and improve your overall health.  YOUR PLAN:  -UPPER RESPIRATORY INFECTION: An upper respiratory infection is a common viral or bacterial infection that affects the nose, throat, and airways. To help manage your symptoms, you have been prescribed doxycycline  and prednisone . Doxycycline  is an antibiotic to treat a potential bacterial infection, and prednisone  is a steroid to reduce inflammation and improve breathing. You should take prednisone  40 mg daily for 5 days. Continue using Theraflu for symptomatic relief and alternate it with NSAIDs like ibuprofen  to manage pain and fever.  -HYPERTENSION: Hypertension, or high blood pressure, is a condition where the force of the blood against your artery walls is too high. It is important to resume taking your antihypertensive medication as prescribed to manage your blood pressure effectively.  INSTRUCTIONS:  Please follow the prescribed medication regimen for your upper respiratory infection and resume your antihypertensive medication. If your symptoms do not improve or if you experience any side effects, please contact our office. Continue to monitor your blood pressure regularly and maintain a healthy lifestyle, including a balanced diet and regular exercise. Follow up with us  if you have any concerns or need further assistance.

## 2023-06-15 NOTE — Progress Notes (Signed)
 Acute Office Visit   Patient: Linda Thomas   DOB: Oct 29, 1980   43 y.o. Female  MRN: [id number] Visit Date: 06/15/2023  Today's healthcare provider: Rocky BRAVO Athel Merriweather, PA-C  Introduced myself to the patient as a PA-C and provided education on APPs in clinical practice.    Chief Complaint  Patient presents with   Cough    X1 week, worse at night. Productive   Subjective    HPI HPI     Cough    Additional comments: X1 week, worse at night. Productive      Last edited by Dann Kirsch, CMA on 06/15/2023 11:10 AM.      Discussed the use of AI scribe software for clinical note transcription with the patient, who gave verbal consent to proceed.  History of Present Illness   The patient, with a history of hypertension, presents with a week-long history of a cold-like illness. The primary complaint is a persistent cough, which is notably worse at night and disrupts sleep. The cough is productive, with thick sputum. The patient denies postnasal drip and nasal congestion, but reports a scratchy throat.  The patient also reports shortness of breath and wheezing. There is a history of fever up to 102 degrees Fahrenheit, which has resolved in the past day, and chills. The patient also reports experiencing nausea, vomiting, and diarrhea. Body aches were present but have been managed with Tylenol , and there have been no aches today. The patient also reports headaches.  The patient denies being around anyone who has been sick and has not been tested for COVID-19. To manage symptoms, the patient has been taking Theraflu, which provides temporary relief. The patient denies any ear pain or history of asthma or other breathing troubles.  The patient has expressed concern about potential weight gain with the steroid treatment.        Medications: Outpatient Medications Prior to Visit  Medication Sig   acetaminophen  (TYLENOL ) 500 MG tablet Take 2 tablets (1,000 mg total) by  mouth every 6 (six) hours as needed for mild pain.   ibuprofen  (ADVIL ) 800 MG tablet Take 1 tablet (800 mg total) by mouth 3 (three) times daily with meals as needed for headache, mild pain, moderate pain or cramping.   labetalol  (NORMODYNE ) 100 MG tablet Take 100 mg by mouth 2 (two) times daily.   Semaglutide ,0.25 or 0.5MG /DOS, (OZEMPIC , 0.25 OR 0.5 MG/DOSE,) 2 MG/3ML SOPN Inject 0.25 mg into the skin once a week.   No facility-administered medications prior to visit.    Review of Systems  Constitutional:  Positive for chills and fever (tmax 102).  HENT:  Positive for sore throat. Negative for congestion, postnasal drip and rhinorrhea.   Respiratory:  Positive for cough, shortness of breath and wheezing.   Gastrointestinal:  Positive for diarrhea, nausea and vomiting.  Musculoskeletal:  Positive for myalgias.  Neurological:  Positive for headaches. Negative for dizziness and light-headedness.        Objective    BP (!) 148/92   Pulse 86   Resp 16   Ht 5' 3 (1.6 m)   Wt 241 lb (109.3 kg)   LMP 07/25/2019 (Within Weeks)   SpO2 98%   BMI 42.69 kg/m     Physical Exam Vitals reviewed.  Constitutional:      General: She is awake.     Appearance: Normal appearance. She is well-developed and well-groomed.  HENT:     Head: Normocephalic and atraumatic.  Right Ear: Hearing, tympanic membrane and ear canal normal.     Left Ear: Hearing, tympanic membrane and ear canal normal.     Mouth/Throat:     Lips: Pink.     Mouth: Mucous membranes are moist.     Pharynx: Oropharynx is clear. Uvula midline. Posterior oropharyngeal erythema present. No oropharyngeal exudate, uvula swelling or postnasal drip.  Pulmonary:     Effort: Pulmonary effort is normal.     Breath sounds: Decreased air movement present. Decreased breath sounds present. No wheezing, rhonchi or rales.  Musculoskeletal:     Cervical back: Normal range of motion and neck supple.  Lymphadenopathy:     Head:      Right side of head: No submental, submandibular or preauricular adenopathy.     Left side of head: No submental, submandibular or preauricular adenopathy.     Cervical:     Right cervical: No superficial cervical adenopathy.    Left cervical: No superficial cervical adenopathy.     Upper Body:     Right upper body: No supraclavicular adenopathy.     Left upper body: No supraclavicular adenopathy.  Neurological:     Mental Status: She is alert.  Psychiatric:        Attention and Perception: Attention and perception normal.        Mood and Affect: Mood normal.        Speech: Speech normal.        Behavior: Behavior normal. Behavior is cooperative.       No results found for any visits on 06/15/23.  Assessment & Plan      No follow-ups on file.        Problem List Items Addressed This Visit   None Visit Diagnoses       Upper respiratory tract infection, unspecified type    -  Primary   Relevant Medications   doxycycline  (VIBRA -TABS) 100 MG tablet   predniSONE  (DELTASONE ) 20 MG tablet      Assessment and Plan    Upper Respiratory Infection Presents with symptoms consistent with an upper respiratory infection, including persistent nocturnal cough, thick sputum, chills, recent fever up to 102F, nausea, vomiting, diarrhea, body aches, and scratchy throat. No nasal congestion, rhinorrhea, or otalgia. No history of asthma or dyspnea. Physical exam revealed reduced air movement without wheezing or rales. Discussed doxycycline  and prednisone  for inflammation and breathing. Explained prednisone  may cause weight gain and offered a 5-day burst regimen. - Prescribe doxycycline  - Prescribe prednisone  40 mg daily for 5 days - Continue Theraflu for symptomatic relief - Alternate NSAIDs like ibuprofen  with Theraflu  Hypertension Non-adherence to antihypertensive medication. Emphasized the importance of resuming medication to manage blood pressure. - Advise to resume antihypertensive  medication.        No follow-ups on file.   I, Dawit Tankard E Azzan Butler, PA-C, have reviewed all documentation for this visit. The documentation on 06/15/23 for the exam, diagnosis, procedures, and orders are all accurate and complete.   [place], MHS, PA-C [facility]

## 2023-06-16 ENCOUNTER — Other Ambulatory Visit: Payer: Self-pay | Admitting: Nurse Practitioner

## 2023-06-16 DIAGNOSIS — I10 Essential (primary) hypertension: Secondary | ICD-10-CM

## 2023-06-16 MED ORDER — LABETALOL HCL 100 MG PO TABS: 100.0000 mg | ORAL_TABLET | Freq: Two times a day (BID) | ORAL | 1 refills | Status: DC

## 2023-07-11 ENCOUNTER — Other Ambulatory Visit: Payer: Self-pay

## 2023-07-11 ENCOUNTER — Encounter (HOSPITAL_BASED_OUTPATIENT_CLINIC_OR_DEPARTMENT_OTHER): Payer: Self-pay | Admitting: Orthopedic Surgery

## 2023-07-11 NOTE — Progress Notes (Signed)
   07/11/23 0947  PAT Phone Screen  Is the patient taking a GLP-1 receptor agonist? (S)  Yes  Has the patient been informed on holding medication? (S)  Yes (1/28 last dose)  Do You Have Diabetes? No  Do You Have Hypertension? Yes  Have You Ever Been to the ER for Asthma? No  Have You Taken Oral Steroids in the Past 3 Months? No  Do you Take Phenteramine or any Other Diet Drugs? No  Recent  Lab Work, EKG, CXR? Yes  Where was this test performed? 06/09/23 [facility]  Do you have a history of heart problems? No  Any Recent Hospitalizations? No  Height 5' 3 (1.6 m)  Weight 107 kg  Pat Appointment Scheduled Yes ([facility])

## 2023-07-12 ENCOUNTER — Other Ambulatory Visit: Payer: Self-pay | Admitting: Orthopedic Surgery

## 2023-07-14 ENCOUNTER — Encounter (HOSPITAL_BASED_OUTPATIENT_CLINIC_OR_DEPARTMENT_OTHER)
Admission: RE | Admit: 2023-07-14 | Discharge: 2023-07-14 | Disposition: A | Discharge status: Home / Self Care | Payer: Medicaid Other | Source: Ambulatory Visit | Attending: Orthopedic Surgery | Admitting: Orthopedic Surgery

## 2023-07-14 DIAGNOSIS — Z01812 Encounter for preprocedural laboratory examination: Secondary | ICD-10-CM | POA: Diagnosis not present

## 2023-07-14 NOTE — Progress Notes (Signed)

## 2023-07-17 ENCOUNTER — Ambulatory Visit (HOSPITAL_BASED_OUTPATIENT_CLINIC_OR_DEPARTMENT_OTHER)
Admission: RE | Admit: 2023-07-17 | Discharge: 2023-07-17 | Disposition: A | Discharge status: Home / Self Care | Payer: Medicaid Other | Attending: Orthopedic Surgery | Admitting: Orthopedic Surgery

## 2023-07-17 ENCOUNTER — Encounter (HOSPITAL_BASED_OUTPATIENT_CLINIC_OR_DEPARTMENT_OTHER)
Admission: RE | Disposition: A | Discharge status: Home / Self Care | Payer: Self-pay | Source: Home / Self Care | Attending: Orthopedic Surgery

## 2023-07-17 ENCOUNTER — Other Ambulatory Visit: Payer: Self-pay

## 2023-07-17 ENCOUNTER — Encounter (HOSPITAL_BASED_OUTPATIENT_CLINIC_OR_DEPARTMENT_OTHER): Payer: Self-pay | Admitting: Orthopedic Surgery

## 2023-07-17 ENCOUNTER — Ambulatory Visit (HOSPITAL_BASED_OUTPATIENT_CLINIC_OR_DEPARTMENT_OTHER): Payer: Medicaid Other | Admitting: Anesthesiology

## 2023-07-17 DIAGNOSIS — G5601 Carpal tunnel syndrome, right upper limb: Secondary | ICD-10-CM

## 2023-07-17 DIAGNOSIS — I10 Essential (primary) hypertension: Secondary | ICD-10-CM | POA: Insufficient documentation

## 2023-07-17 DIAGNOSIS — Z87891 Personal history of nicotine dependence: Secondary | ICD-10-CM | POA: Diagnosis not present

## 2023-07-17 DIAGNOSIS — R519 Headache, unspecified: Secondary | ICD-10-CM | POA: Diagnosis not present

## 2023-07-17 DIAGNOSIS — Z6841 Body Mass Index (BMI) 40.0 and over, adult: Secondary | ICD-10-CM | POA: Insufficient documentation

## 2023-07-17 DIAGNOSIS — E66813 Obesity, class 3: Secondary | ICD-10-CM | POA: Insufficient documentation

## 2023-07-17 DIAGNOSIS — Z79899 Other long term (current) drug therapy: Secondary | ICD-10-CM | POA: Insufficient documentation

## 2023-07-17 DIAGNOSIS — E119 Type 2 diabetes mellitus without complications: Secondary | ICD-10-CM | POA: Insufficient documentation

## 2023-07-17 DIAGNOSIS — G4730 Sleep apnea, unspecified: Secondary | ICD-10-CM | POA: Insufficient documentation

## 2023-07-17 HISTORY — DX: Sleep apnea, unspecified: G47.30

## 2023-07-17 HISTORY — DX: Type 2 diabetes mellitus without complications: E11.9

## 2023-07-17 HISTORY — PX: CARPAL TUNNEL RELEASE: SHX101

## 2023-07-17 LAB — GLUCOSE, CAPILLARY
Glucose-Capillary: 84 mg/dL (ref 70–99)
Glucose-Capillary: 98 mg/dL (ref 70–99)

## 2023-07-17 SURGERY — CARPAL TUNNEL RELEASE
Anesthesia: General | Site: Hand | Laterality: Right

## 2023-07-17 MED ORDER — LIDOCAINE 2% (20 MG/ML) 5 ML SYRINGE
INTRAMUSCULAR | Status: AC
  Filled 2023-07-17: qty 5

## 2023-07-17 MED ORDER — BUPIVACAINE HCL (PF) 0.25 % IJ SOLN
INTRAMUSCULAR | Status: DC | PRN
  Administered 2023-07-17: 9 mL

## 2023-07-17 MED ORDER — MIDAZOLAM HCL 5 MG/5ML IJ SOLN
INTRAMUSCULAR | Status: DC | PRN
  Administered 2023-07-17: 2 mg via INTRAVENOUS

## 2023-07-17 MED ORDER — TRAMADOL HCL 50 MG PO TABS: ORAL_TABLET | ORAL | 0 refills | Status: DC

## 2023-07-17 MED ORDER — FENTANYL CITRATE (PF) 100 MCG/2ML IJ SOLN: 25.0000 ug | INTRAMUSCULAR | Status: DC | PRN

## 2023-07-17 MED ORDER — DEXMEDETOMIDINE HCL IN NACL 80 MCG/20ML IV SOLN
INTRAVENOUS | Status: DC | PRN
  Administered 2023-07-17: 8 ug via INTRAVENOUS

## 2023-07-17 MED ORDER — ONDANSETRON HCL 4 MG/2ML IJ SOLN
INTRAMUSCULAR | Status: AC
  Filled 2023-07-17: qty 2

## 2023-07-17 MED ORDER — CEFAZOLIN SODIUM-DEXTROSE 2-4 GM/100ML-% IV SOLN
2.0000 g | INTRAVENOUS | Status: AC
  Administered 2023-07-17: 2 g via INTRAVENOUS

## 2023-07-17 MED ORDER — MIDAZOLAM HCL 2 MG/2ML IJ SOLN
INTRAMUSCULAR | Status: AC
Start: 2023-07-17 — End: ?
  Filled 2023-07-17: qty 2

## 2023-07-17 MED ORDER — LIDOCAINE 2% (20 MG/ML) 5 ML SYRINGE
INTRAMUSCULAR | Status: DC | PRN
Start: 2023-07-17 — End: 2023-07-17
  Administered 2023-07-17: 60 mg via INTRAVENOUS

## 2023-07-17 MED ORDER — ONDANSETRON HCL 4 MG/2ML IJ SOLN
INTRAMUSCULAR | Status: DC | PRN
  Administered 2023-07-17: 4 mg via INTRAVENOUS

## 2023-07-17 MED ORDER — OXYCODONE HCL 5 MG/5ML PO SOLN: 5.0000 mg | Freq: Once | ORAL | Status: DC | PRN

## 2023-07-17 MED ORDER — CEFAZOLIN SODIUM-DEXTROSE 2-4 GM/100ML-% IV SOLN
INTRAVENOUS | Status: AC
  Filled 2023-07-17: qty 100

## 2023-07-17 MED ORDER — AMISULPRIDE (ANTIEMETIC) 5 MG/2ML IV SOLN: 10.0000 mg | Freq: Once | INTRAVENOUS | Status: DC | PRN

## 2023-07-17 MED ORDER — DEXAMETHASONE SODIUM PHOSPHATE 10 MG/ML IJ SOLN
INTRAMUSCULAR | Status: DC | PRN
  Administered 2023-07-17: 10 mg via INTRAVENOUS

## 2023-07-17 MED ORDER — GLYCOPYRROLATE PF 0.2 MG/ML IJ SOSY
PREFILLED_SYRINGE | INTRAMUSCULAR | Status: DC | PRN
  Administered 2023-07-17: .2 mg via INTRAVENOUS

## 2023-07-17 MED ORDER — OXYCODONE HCL 5 MG PO TABS: 5.0000 mg | ORAL_TABLET | Freq: Once | ORAL | Status: DC | PRN

## 2023-07-17 MED ORDER — ONDANSETRON HCL 4 MG/2ML IJ SOLN: 4.0000 mg | Freq: Once | INTRAMUSCULAR | Status: DC | PRN

## 2023-07-17 MED ORDER — KETOROLAC TROMETHAMINE 30 MG/ML IJ SOLN
INTRAMUSCULAR | Status: DC | PRN
  Administered 2023-07-17: 30 mg via INTRAVENOUS

## 2023-07-17 MED ORDER — DEXAMETHASONE SODIUM PHOSPHATE 10 MG/ML IJ SOLN
INTRAMUSCULAR | Status: AC
  Filled 2023-07-17: qty 1

## 2023-07-17 MED ORDER — LACTATED RINGERS IV SOLN: INTRAVENOUS | Status: DC

## 2023-07-17 MED ORDER — KETOROLAC TROMETHAMINE 30 MG/ML IJ SOLN
INTRAMUSCULAR | Status: AC
  Filled 2023-07-17: qty 1

## 2023-07-17 MED ORDER — GLYCOPYRROLATE PF 0.2 MG/ML IJ SOSY
PREFILLED_SYRINGE | INTRAMUSCULAR | Status: AC
  Filled 2023-07-17: qty 1

## 2023-07-17 MED ORDER — PROPOFOL 10 MG/ML IV BOLUS
INTRAVENOUS | Status: DC | PRN
  Administered 2023-07-17: 50 mg via INTRAVENOUS
  Administered 2023-07-17: 200 mg via INTRAVENOUS

## 2023-07-17 MED ORDER — FENTANYL CITRATE (PF) 100 MCG/2ML IJ SOLN
INTRAMUSCULAR | Status: AC
  Filled 2023-07-17: qty 2

## 2023-07-17 MED ORDER — FENTANYL CITRATE (PF) 100 MCG/2ML IJ SOLN
INTRAMUSCULAR | Status: DC | PRN
  Administered 2023-07-17 (×2): 50 ug via INTRAVENOUS

## 2023-07-17 SURGICAL SUPPLY — 29 items
BLADE SURG 15 STRL LF DISP TIS (BLADE) ×4 IMPLANT
BNDG ELASTIC 3INX 5YD STR LF (GAUZE/BANDAGES/DRESSINGS) ×2 IMPLANT
BNDG ESMARK 4X9 LF (GAUZE/BANDAGES/DRESSINGS) IMPLANT
BNDG GAUZE DERMACEA FLUFF 4 (GAUZE/BANDAGES/DRESSINGS) ×2 IMPLANT
CHLORAPREP W/TINT 26 (MISCELLANEOUS) ×2 IMPLANT
CORD BIPOLAR FORCEPS 12FT (ELECTRODE) ×2 IMPLANT
COVER BACK TABLE 60X90IN (DRAPES) ×2 IMPLANT
COVER MAYO STAND STRL (DRAPES) ×2 IMPLANT
CUFF TOURN SGL QUICK 18X4 (TOURNIQUET CUFF) ×2 IMPLANT
DRAPE EXTREMITY T 121X128X90 (DISPOSABLE) ×2 IMPLANT
DRAPE SURG 17X23 STRL (DRAPES) ×2 IMPLANT
GAUZE PAD ABD 8X10 STRL (GAUZE/BANDAGES/DRESSINGS) ×2 IMPLANT
GAUZE SPONGE 4X4 12PLY STRL (GAUZE/BANDAGES/DRESSINGS) ×2 IMPLANT
GAUZE XEROFORM 1X8 LF (GAUZE/BANDAGES/DRESSINGS) ×2 IMPLANT
GLOVE BIO SURGEON STRL SZ7.5 (GLOVE) ×2 IMPLANT
GLOVE BIOGEL PI IND STRL 8 (GLOVE) ×2 IMPLANT
GOWN STRL REUS W/ TWL LRG LVL3 (GOWN DISPOSABLE) ×2 IMPLANT
GOWN STRL REUS W/TWL XL LVL3 (GOWN DISPOSABLE) ×2 IMPLANT
NDL HYPO 25X1 1.5 SAFETY (NEEDLE) ×2 IMPLANT
NEEDLE HYPO 25X1 1.5 SAFETY (NEEDLE) ×1
NS IRRIG 1000ML POUR BTL (IV SOLUTION) ×2 IMPLANT
PACK BASIN DAY SURGERY FS (CUSTOM PROCEDURE TRAY) ×2 IMPLANT
PADDING CAST ABS COTTON 4X4 ST (CAST SUPPLIES) ×2 IMPLANT
STOCKINETTE 4X48 STRL (DRAPES) ×2 IMPLANT
SUT ETHILON 4 0 PS 2 18 (SUTURE) ×2 IMPLANT
SYR BULB EAR ULCER 3OZ GRN STR (SYRINGE) ×2 IMPLANT
SYR CONTROL 10ML LL (SYRINGE) ×2 IMPLANT
TOWEL GREEN STERILE FF (TOWEL DISPOSABLE) ×4 IMPLANT
UNDERPAD 30X36 HEAVY ABSORB (UNDERPADS AND DIAPERS) ×2 IMPLANT

## 2023-07-17 NOTE — Op Note (Signed)
 07/17/2023 [facility]                              OPERATIVE REPORT   PREOPERATIVE DIAGNOSIS:  Right carpal tunnel syndrome.  POSTOPERATIVE DIAGNOSIS:  Right carpal tunnel syndrome.  PROCEDURE:  Right carpal tunnel release.  SURGEON:  Emmi Wertheim, MD  ASSISTANT:  none.  ANESTHESIA: General  IV FLUIDS:  Per anesthesia flow sheet.  ESTIMATED BLOOD LOSS:  Minimal.  COMPLICATIONS:  None.  SPECIMENS:  None.  TOURNIQUET TIME:    Total Tourniquet Time Documented: Upper Arm (Right) - 12 minutes Total: Upper Arm (Right) - 12 minutes   DISPOSITION:  Stable to PACU.  LOCATION: [facility]  INDICATIONS:  43 y.o. yo female with numbness and tingling right hand.  Positive nerve conduction studies. She wishes to proceed with right carpal tunnel release.  Risks, benefits and alternatives of surgery were discussed including the risk of blood loss; infection; damage to nerves, vessels, tendons, ligaments, bone; failure of surgery; need for additional surgery; complications with wound healing; continued pain; recurrence of carpal tunnel syndrome; and damage to motor branch. She voiced understanding of these risks and elected to proceed.   OPERATIVE COURSE:  After being identified preoperatively by myself, the patient and I agreed upon the procedure and site of procedure.  The surgical site was marked.  Surgical consent had been signed.  She was given IV Ancef  as preoperative antibiotic prophylaxis.  She was transferred to the operating room and placed on the operating room table in supine position with the Right upper extremity on an armboard.  General anesthesia was induced by the anesthesiologist.  Right upper extremity was prepped and draped in normal sterile orthopaedic fashion.  A surgical pause was performed between the surgeons, anesthesia, and operating room staff, and all were in agreement as to the patient, procedure, and site of procedure.  Tourniquet at the  proximal aspect of the extremity was inflated to 250 mmHg after exsanguination of the arm with an Esmarch bandage  Incision was made over the transverse carpal ligament and carried into the subcutaneous tissues by spreading technique.  Bipolar electrocautery was used to obtain hemostasis.  The palmar fascia was sharply incised.  The transverse carpal ligament was identified.  The fascia distal to the ligament was opened.  Retractor was placed and the flexor tendons were identified.  The flexor tendon to the ring finger was identified and retracted radially.  The transverse carpal ligament was then incised from distal to proximal under direct visualization.  Scissors were used to split the distal aspect of the volar antebrachial fascia.  A finger was placed into the wound to ensure complete decompression, which was the case.  The nerve was examined.  It was flattened and hyperemic. It was adherent to the radial leaflet.  The motor branch was identified and was intact.  The wound was copiously irrigated with sterile saline.  It was then closed with 4-0 nylon in a horizontal mattress fashion.  It was injected with 0.25% plain Marcaine  to aid in postoperative analgesia.  It was dressed with sterile Xeroform, 4x4s, an ABD, and wrapped with Kerlix and an Ace bandage.  Tourniquet was deflated at 12 minutes.  Fingertips were pink with brisk capillary refill after deflation of the tourniquet.  Operative drapes were broken down.  The patient was awoken from anesthesia safely.  She was transferred back to stretcher and taken to the  PACU in stable condition.  I will see her back in the office in 1 week for postoperative followup.  I will give her a prescription for Tramadol  50 mg 1-2 tabs PO q6 hours prn pain, dispense # 20.    Leyland Kenna, MD Electronically signed, 07/17/23

## 2023-07-17 NOTE — Anesthesia Procedure Notes (Signed)
 Procedure Name: LMA Insertion Date/Time: 07/17/2023 2:18 PM  Performed by: Darcel Early, CRNAPre-anesthesia Checklist: Patient identified, Emergency Drugs available, Suction available, Patient being monitored and Timeout performed Patient Re-evaluated:Patient Re-evaluated prior to induction Oxygen Delivery Method: Circle system utilized Preoxygenation: Pre-oxygenation with 100% oxygen Induction Type: IV induction Ventilation: Mask ventilation without difficulty LMA: LMA inserted LMA Size: 4.0 Number of attempts: 1 Placement Confirmation: positive ETCO2 and breath sounds checked- equal and bilateral Tube secured with: Tape Dental Injury: Teeth and Oropharynx as per pre-operative assessment

## 2023-07-17 NOTE — Anesthesia Postprocedure Evaluation (Signed)
 Anesthesia Post Note  Patient: Linda Thomas  Procedure(s) Performed: RIGHT CARPAL TUNNEL RELEASE (Right: Hand)     Patient location during evaluation: PACU Anesthesia Type: General Level of consciousness: awake and alert, oriented and patient cooperative Pain management: pain level controlled Vital Signs Assessment: post-procedure vital signs reviewed and stable Respiratory status: spontaneous breathing, nonlabored ventilation and respiratory function stable Cardiovascular status: blood pressure returned to baseline and stable Postop Assessment: no apparent nausea or vomiting Anesthetic complications: no   No notable events documented.  Last Vitals:  Vitals:   07/17/23 1505 07/17/23 1515  BP:  113/73  Pulse: 78 74  Resp: 13 15  Temp:    SpO2: 99% 96%    Last Pain:  Vitals:   07/17/23 1515  TempSrc:   PainSc: 0-No pain                 Jacquelyne Matte

## 2023-07-17 NOTE — Discharge Instructions (Addendum)
 [facility] Instructions Hand Surgery  Wound Care: Keep your hand elevated above the level of your heart.  Do not allow it to dangle by your side.  Keep the dressing dry and do not remove it unless your doctor advises you to do so.  He will usually change it at the time of your post-op visit.  Moving your fingers is advised to stimulate circulation but will depend on the site of your surgery.  If you have a splint applied, your doctor will advise you regarding movement.  Activity: Do not drive or operate machinery today.  Rest today and then you may return to your normal activity and work as indicated by your physician.  Diet:  Drink liquids today or eat a light diet.  You may resume a regular diet tomorrow.    General expectations: Pain for two to three days. Fingers may become slightly swollen.  Call your doctor if any of the following occur: Severe pain not relieved by pain medication. Elevated temperature. Dressing soaked with blood. Inability to move fingers. White or bluish color to fingers.      Post Anesthesia Home Care Instructions  Activity: Get plenty of rest for the remainder of the day. A responsible individual must stay with you for 24 hours following the procedure.  For the next 24 hours, DO NOT: -Drive a car -Advertising copywriter -Drink alcoholic beverages -Take any medication unless instructed by your physician -Make any legal decisions or sign important papers.  Meals: Start with liquid foods such as gelatin or soup. Progress to regular foods as tolerated. Avoid greasy, spicy, heavy foods. If nausea and/or vomiting occur, drink only clear liquids until the nausea and/or vomiting subsides. Call your physician if vomiting continues.  Special Instructions/Symptoms: Your throat may feel dry or sore from the anesthesia or the breathing tube placed in your throat during surgery. If this causes discomfort, gargle with warm salt water. The discomfort should disappear  within 24 hours.  If you had a scopolamine  patch placed behind your ear for the management of post- operative nausea and/or vomiting:  1. The medication in the patch is effective for 72 hours, after which it should be removed.  Wrap patch in a tissue and discard in the trash. Wash hands thoroughly with soap and water. 2. You may remove the patch earlier than 72 hours if you experience unpleasant side effects which may include dry mouth, dizziness or visual disturbances. 3. Avoid touching the patch. Wash your hands with soap and water after contact with the patch.      Next dose of ibuprofen  after 8:45 PM

## 2023-07-17 NOTE — Transfer of Care (Signed)
 Immediate Anesthesia Transfer of Care Note  Patient: Linda Thomas  Procedure(s) Performed: RIGHT CARPAL TUNNEL RELEASE (Right: Hand)  Patient Location: PACU  Anesthesia Type:General  Level of Consciousness: drowsy  Airway & Oxygen Therapy: Patient Spontanous Breathing and Patient connected to face mask oxygen  Post-op Assessment: Report given to RN and Post -op Vital signs reviewed and stable  Post vital signs: Reviewed and stable  Last Vitals:  Vitals Value Taken Time  BP 117/81 07/17/23 1449  Temp 36.2 C 07/17/23 1449  Pulse 86 07/17/23 1455  Resp 14 07/17/23 1455  SpO2 99 % 07/17/23 1455  Vitals shown include unfiled device data.  Last Pain:  Vitals:   07/17/23 1310  TempSrc: Temporal  PainSc: 0-No pain         Complications: No notable events documented.

## 2023-07-17 NOTE — Anesthesia Preprocedure Evaluation (Signed)
 Anesthesia Evaluation  Patient identified by MRN, date of birth, ID band Patient awake    Reviewed: Allergy & Precautions, NPO status , Patient's Chart, lab work & pertinent test results  History of Anesthesia Complications Negative for: history of anesthetic complications  Airway Mallampati: IV  TM Distance: >3 FB Neck ROM: Full    Dental  (+) Teeth Intact, Dental Advisory Given   Pulmonary neg shortness of breath, neg COPD, former smoker   breath sounds clear to auscultation       Cardiovascular hypertension, Pt. on medications and Pt. on home beta blockers (-) angina (-) Past MI and (-) CHF  Rhythm:Regular     Neuro/Psych  Headaches, neg Seizures    GI/Hepatic negative GI ROS, Neg liver ROS,,,  Endo/Other  diabetes, Type 2  Class 3 obesity  Renal/GU negative Renal ROS     Musculoskeletal negative musculoskeletal ROS (+)    Abdominal   Peds  Hematology negative hematology ROS (+)   Anesthesia Other Findings   Reproductive/Obstetrics                             Anesthesia Physical Anesthesia Plan  ASA: 3  Anesthesia Plan: General   Post-op Pain Management: Minimal or no pain anticipated   Induction: Intravenous  PONV Risk Score and Plan: 3 and Ondansetron  and Dexamethasone   Airway Management Planned: LMA  Additional Equipment: None  Intra-op Plan:   Post-operative Plan: Extubation in OR  Informed Consent: I have reviewed the patients History and Physical, chart, labs and discussed the procedure including the risks, benefits and alternatives for the proposed anesthesia with the patient or authorized representative who has indicated his/her understanding and acceptance.     Dental advisory given  Plan Discussed with: CRNA  Anesthesia Plan Comments:        Anesthesia Quick Evaluation

## 2023-07-17 NOTE — H&P (Signed)
 Linda Thomas is an 43 y.o. female.   Chief Complaint: carpal tunnel syndrome HPI: 43 y.o. yo female with numbness and tingling right hand.  Nocturnal symptoms. Positive nerve conduction studies. She wishes to have right carpal tunnel release.   Allergies:  Allergies  Allergen Reactions   Other Itching    Reaction to unknown antibiotic at age 11   Penicillins Itching    Has patient had a PCN reaction causing immediate rash, facial/tongue/throat swelling, SOB or lightheadedness with hypotension: No Has patient had a PCN reaction causing severe rash involving mucus membranes or skin necrosis: No Has patient had a PCN reaction that required hospitalization: No Has patient had a PCN reaction occurring within the last 10 years: No If all of the above answers are "NO", then may proceed with Cephalosporin use.     Past Medical History:  Diagnosis Date   Bell's palsy    Diabetes mellitus without complication (HCC)    Hyperglycemia    A1c 6.4% 08/30/19   Hypertension during pregnancy   Hypertension    Idiopathic intracranial hypertension    Migraine    Sleep apnea     Past Surgical History:  Procedure Laterality Date   CESAREAN SECTION     x2   HYSTERECTOMY ABDOMINAL WITH SALPINGECTOMY Bilateral 09/03/2019   Procedure: HYSTERECTOMY ABDOMINAL WITH BILATERAL SALPINGECTOMY;  Surgeon: Julianne Octave, MD;  Location: [facility] OR;  Service: Gynecology;  Laterality: Bilateral;   INCISIONAL HERNIA REPAIR N/A 09/21/2022   Procedure: DIAGNOSTIC LAPAROSCOPY;  Surgeon: Oza Blumenthal, MD;  Location: [facility] OR;  Service: General;  Laterality: N/A;   INCISIONAL HERNIA REPAIR N/A 09/21/2022   Procedure: OPEN HERNIA REPAIR INCISIONAL;  Surgeon: Oza Blumenthal, MD;  Location: [facility] OR;  Service: General;  Laterality: N/A;   LAPAROSCOPY N/A 09/03/2019   Procedure: Laparoscopy Diagnostic;  Surgeon: Julianne Octave, MD;  Location: [facility] OR;  Service: Gynecology;  Laterality: N/A;   TUBAL LIGATION       Family History: Family History  Problem Relation Age of Onset   Lung cancer Father    Bleeding Disorder Neg Hx    Pseudotumor cerebri Neg Hx     Social History:   reports that she quit smoking about 11 years ago. Her smoking use included cigarettes. She has never used smokeless tobacco. She reports current alcohol use. She reports that she does not use drugs.  Medications: Medications Prior to Admission  Medication Sig Dispense Refill   labetalol  (NORMODYNE ) 100 MG tablet Take 1 tablet (100 mg total) by mouth 2 (two) times daily. 180 tablet 1   acetaminophen  (TYLENOL ) 500 MG tablet Take 2 tablets (1,000 mg total) by mouth every 6 (six) hours as needed for mild pain. 30 tablet 0   ibuprofen  (ADVIL ) 800 MG tablet Take 1 tablet (800 mg total) by mouth 3 (three) times daily with meals as needed for headache, mild pain, moderate pain or cramping. 30 tablet 2   Semaglutide ,0.25 or 0.5MG /DOS, (OZEMPIC , 0.25 OR 0.5 MG/DOSE,) 2 MG/3ML SOPN Inject 0.25 mg into the skin once a week. 3 mL 0    No results found for this or any previous visit (from the past 48 hours).  No results found.    Height 5\' 3"  (1.6 m), weight 107 kg, last menstrual period 07/25/2019.  General appearance: alert, cooperative, and appears stated age Head: Normocephalic, without obvious abnormality, atraumatic Neck: supple, symmetrical, trachea midline Extremities: Intact capillary refill all digits.  +epl/fpl/io.  No wounds.  Skin: Skin color, texture,  turgor normal. No rashes or lesions Neurologic: Grossly normal Incision/Wound: none  Assessment/Plan Right carpal tunnel syndrome.  Non operative and operative treatment options have been discussed with the patient and patient wishes to proceed with operative treatment. Risks, benefits and alternatives of surgery were discussed including risks of blood loss, infection, damage to nerves/vessels/tendons/ligament/bone, failure of surgery, need for additional surgery,  complication with wound healing, stiffness, damage to motor branch, recurrence.  She voiced understanding of these risks and elected to proceed.    Linda Thomas 07/17/2023, 1:11 PM

## 2023-07-18 ENCOUNTER — Encounter (HOSPITAL_BASED_OUTPATIENT_CLINIC_OR_DEPARTMENT_OTHER): Payer: Self-pay | Admitting: Orthopedic Surgery

## 2023-07-25 DIAGNOSIS — G5603 Carpal tunnel syndrome, bilateral upper limbs: Secondary | ICD-10-CM | POA: Diagnosis not present

## 2023-07-25 DIAGNOSIS — G5601 Carpal tunnel syndrome, right upper limb: Secondary | ICD-10-CM | POA: Diagnosis not present

## 2023-07-31 ENCOUNTER — Other Ambulatory Visit: Payer: Self-pay | Admitting: Nurse Practitioner

## 2023-07-31 DIAGNOSIS — G5603 Carpal tunnel syndrome, bilateral upper limbs: Secondary | ICD-10-CM | POA: Diagnosis not present

## 2023-07-31 DIAGNOSIS — E1165 Type 2 diabetes mellitus with hyperglycemia: Secondary | ICD-10-CM

## 2023-07-31 MED ORDER — OZEMPIC (0.25 OR 0.5 MG/DOSE) 2 MG/3ML ~~LOC~~ SOPN: 0.5000 mg | PEN_INJECTOR | SUBCUTANEOUS | 0 refills | Status: DC

## 2023-08-28 ENCOUNTER — Other Ambulatory Visit: Payer: Self-pay | Admitting: Nurse Practitioner

## 2023-08-28 DIAGNOSIS — E1165 Type 2 diabetes mellitus with hyperglycemia: Secondary | ICD-10-CM

## 2023-08-28 MED ORDER — OZEMPIC (0.25 OR 0.5 MG/DOSE) 2 MG/3ML ~~LOC~~ SOPN
0.5000 mg | PEN_INJECTOR | SUBCUTANEOUS | 0 refills | Status: DC
Start: 2023-08-28 — End: 2023-09-25

## 2023-08-30 DIAGNOSIS — G5603 Carpal tunnel syndrome, bilateral upper limbs: Secondary | ICD-10-CM | POA: Diagnosis not present

## 2023-09-25 ENCOUNTER — Other Ambulatory Visit: Payer: Self-pay | Admitting: Nurse Practitioner

## 2023-09-25 DIAGNOSIS — E1165 Type 2 diabetes mellitus with hyperglycemia: Secondary | ICD-10-CM

## 2023-09-26 MED ORDER — OZEMPIC (0.25 OR 0.5 MG/DOSE) 2 MG/3ML ~~LOC~~ SOPN: 0.5000 mg | PEN_INJECTOR | SUBCUTANEOUS | 0 refills | Status: DC

## 2023-11-08 ENCOUNTER — Other Ambulatory Visit: Payer: Self-pay | Admitting: Nurse Practitioner

## 2023-11-08 DIAGNOSIS — E1165 Type 2 diabetes mellitus with hyperglycemia: Secondary | ICD-10-CM

## 2023-11-09 NOTE — Telephone Encounter (Signed)
 OFFICE VISIT NEEDED FOR ADDITIONAL REFILLS   Requested Prescriptions  Pending Prescriptions Disp Refills   OZEMPIC , 0.25 OR 0.5 MG/DOSE, 2 MG/3ML SOPN [Pharmacy Med Name: OZEMPIC  0.25 OR 0.5MG  DOS(2MG /3ML)] 3 mL 0    Sig: INJECT 0.25 MG UNDER THE SKIN ONCE A WEEK     Endocrinology:  Diabetes - GLP-1 Receptor Agonists - semaglutide  Failed - 11/09/2023  4:24 PM      Failed - HBA1C in normal range and within 180 days    Hgb A1c MFr Bld  Date Value Ref Range Status  06/09/2023 6.7 (H) <5.7 % of total Hgb Final    Comment:    For someone without known diabetes, a hemoglobin A1c value of 6.5% or greater indicates that they may have  diabetes and this should be confirmed with a follow-up  test. . For someone with known diabetes, a value <7% indicates  that their diabetes is well controlled and a value  greater than or equal to 7% indicates suboptimal  control. A1c targets should be individualized based on  duration of diabetes, age, comorbid conditions, and  other considerations. . Currently, no consensus exists regarding use of hemoglobin A1c for diagnosis of diabetes for children. .          Failed - Valid encounter within last 6 months    Recent Outpatient Visits   None            Passed - Cr in normal range and within 360 days    Creat  Date Value Ref Range Status  06/09/2023 0.72 0.50 - 0.99 mg/dL Final   Creatinine, Urine  Date Value Ref Range Status  06/09/2023 184 20 - 275 mg/dL Final

## 2023-11-17 ENCOUNTER — Emergency Department (HOSPITAL_COMMUNITY)

## 2023-11-17 ENCOUNTER — Other Ambulatory Visit: Payer: Self-pay

## 2023-11-17 ENCOUNTER — Emergency Department (HOSPITAL_COMMUNITY)
Admission: EM | Admit: 2023-11-17 | Discharge: 2023-11-17 | Disposition: A | Discharge status: Home / Self Care | Attending: Emergency Medicine | Admitting: Emergency Medicine

## 2023-11-17 ENCOUNTER — Ambulatory Visit: Payer: Self-pay

## 2023-11-17 DIAGNOSIS — I10 Essential (primary) hypertension: Secondary | ICD-10-CM | POA: Diagnosis not present

## 2023-11-17 DIAGNOSIS — Z79899 Other long term (current) drug therapy: Secondary | ICD-10-CM | POA: Diagnosis not present

## 2023-11-17 DIAGNOSIS — H538 Other visual disturbances: Secondary | ICD-10-CM | POA: Insufficient documentation

## 2023-11-17 DIAGNOSIS — R519 Headache, unspecified: Secondary | ICD-10-CM | POA: Diagnosis not present

## 2023-11-17 LAB — CBC WITH DIFFERENTIAL/PLATELET
Abs Immature Granulocytes: 0.01 10*3/uL (ref 0.00–0.07)
Basophils Absolute: 0 10*3/uL (ref 0.0–0.1)
Basophils Relative: 1 %
Eosinophils Absolute: 0.1 10*3/uL (ref 0.0–0.5)
Eosinophils Relative: 2 %
HCT: 39.6 % (ref 36.0–46.0)
Hemoglobin: 12.1 g/dL (ref 12.0–15.0)
Immature Granulocytes: 0 %
Lymphocytes Relative: 41 %
Lymphs Abs: 2.5 10*3/uL (ref 0.7–4.0)
MCH: 27.4 pg (ref 26.0–34.0)
MCHC: 30.6 g/dL (ref 30.0–36.0)
MCV: 89.6 fL (ref 80.0–100.0)
Monocytes Absolute: 0.5 10*3/uL (ref 0.1–1.0)
Monocytes Relative: 9 %
Neutro Abs: 2.9 10*3/uL (ref 1.7–7.7)
Neutrophils Relative %: 47 %
Platelets: 199 10*3/uL (ref 150–400)
RBC: 4.42 MIL/uL (ref 3.87–5.11)
RDW: 14.3 % (ref 11.5–15.5)
WBC: 6.1 10*3/uL (ref 4.0–10.5)
nRBC: 0 % (ref 0.0–0.2)

## 2023-11-17 LAB — BASIC METABOLIC PANEL WITH GFR
Anion gap: 9 (ref 5–15)
BUN: 15 mg/dL (ref 6–20)
CO2: 20 mmol/L — ABNORMAL LOW (ref 22–32)
Calcium: 8.3 mg/dL — ABNORMAL LOW (ref 8.9–10.3)
Chloride: 103 mmol/L (ref 98–111)
Creatinine, Ser: 0.77 mg/dL (ref 0.44–1.00)
GFR, Estimated: >60 mL/min (ref >60)
Glucose, Bld: 84 mg/dL (ref 70–99)
Potassium: 3.5 mmol/L (ref 3.5–5.1)
Sodium: 132 mmol/L — ABNORMAL LOW (ref 135–145)

## 2023-11-17 MED ORDER — DIPHENHYDRAMINE HCL 50 MG/ML IJ SOLN
12.5000 mg | Freq: Once | INTRAMUSCULAR | Status: AC
  Administered 2023-11-17: 12.5 mg via INTRAVENOUS
  Filled 2023-11-17: qty 1

## 2023-11-17 MED ORDER — HYDROCHLOROTHIAZIDE 25 MG PO TABS: 25.0000 mg | ORAL_TABLET | Freq: Every day | ORAL | 0 refills | Status: DC

## 2023-11-17 MED ORDER — METOCLOPRAMIDE HCL 5 MG/ML IJ SOLN
10.0000 mg | Freq: Once | INTRAMUSCULAR | Status: AC
  Administered 2023-11-17: 10 mg via INTRAVENOUS
  Filled 2023-11-17: qty 2

## 2023-11-17 NOTE — ED Notes (Signed)
 Rainbow sent to lab

## 2023-11-17 NOTE — Telephone Encounter (Signed)
 FYI Only or Action Required?: FYI only for provider  Patient was last seen in primary care on 06/09/2023 by Quinton Buckler, FNP. Called Nurse Triage reporting Hypertension. Symptoms began several months ago. Interventions attempted: Prescription medications: see note . Symptoms are: gradually worsening.  Triage Disposition: Go to ED Now (Notify PCP) See note.   Patient/caregiver understands and will follow disposition?: Yes       Copied from CRM [id number]. Topic: Clinical - Red Word Triage >> Nov 17, 2023  4:09 PM DeAngela L wrote: Red Word that prompted transfer to Nurse Triage: patient states her blood pressure has been high and not controlled by her medication it has been a while but the patient is now having concerns   Pt num [phone] (M) Reason for Disposition  [1] Systolic BP  >= 160 OR Diastolic >= 100 AND [2] cardiac (e.g., breathing difficulty, chest pain) or neurologic symptoms (e.g., new-onset blurred or double vision, unsteady gait)  Answer Assessment - Initial Assessment Questions 1. BLOOD PRESSURE: What is the blood pressure? Did you take at least two measurements 5 minutes apart?     ---------------159/88 HR 86 -----------------166/103   2. ONSET: When did you take your blood pressure?     -------------- a couple months    3. HOW: How did you take your blood pressure? (e.g., automatic home BP monitor, visiting nurse)     ----------- home monitor.    4. HISTORY: Do you have a history of high blood pressure?     --- HTN    5. MEDICINES: Are you taking any medicines for blood pressure? Have you missed any doses recently?     ---  Labetolol 200 BID   6. OTHER SYMPTOMS: Do you have any symptoms? (e.g., blurred vision, chest pain, difficulty breathing, headache, weakness)     Headache---- 7/10, blurred vision-- Both eyes       Additional Information: --Patient reported she was on multiple BP medications with her last provider, but since  switching to a new PCP she is only on Labteolol.  Patient reports her BP has been high since changing and she is concerned now d/t her symptoms.  --Patient referred to the ER.  Protocols used: Blood Pressure - High-A-AH

## 2023-11-17 NOTE — ED Provider Notes (Signed)
 [place] EMERGENCY DEPARTMENT AT [facility] Provider Note   CSN: [id number] Arrival date & time: 11/17/23  1656     Patient presents with: Hypertension, Blurred Vision, and Headache   Linda Thomas is a 43 y.o. female.   43 year old female with history hypertension presents due to increased blood pressure for several weeks.  Patient states that she takes labetalol  100 mg twice a day.  She was on losartan  about a year ago but stopped that.  States that when she has increased blood pressure she does develop a headache.  Today was associate with nausea which is different for her.  Denies any focal weakness.  Denies any cardiac symptomatology.  She denies being short of breath.  Has had some blurry vision but denies any vision loss.       Prior to Admission medications   Medication Sig Start Date End Date Taking? Authorizing Provider  acetaminophen  (TYLENOL ) 500 MG tablet Take 2 tablets (1,000 mg total) by mouth every 6 (six) hours as needed for mild pain. 09/26/22   Meuth, Quillian Brunt, PA-C  ibuprofen  (ADVIL ) 800 MG tablet Take 1 tablet (800 mg total) by mouth 3 (three) times daily with meals as needed for headache, mild pain, moderate pain or cramping. 09/05/19   Anyanwu, Ugonna A, MD  labetalol  (NORMODYNE ) 100 MG tablet Take 1 tablet (100 mg total) by mouth 2 (two) times daily. 06/16/23   Pender, Julie F, FNP  OZEMPIC , 0.25 OR 0.5 MG/DOSE, 2 MG/3ML SOPN INJECT 0.25 MG UNDER THE SKIN ONCE A WEEK 11/09/23   Pender, Julie F, FNP  traMADol  (ULTRAM ) 50 MG tablet 1-2 tabs PO q6 hours prn pain 07/17/23   Kuzma, Kevin, MD    Allergies: Other and Penicillins    Review of Systems  All other systems reviewed and are negative.   Updated Vital Signs BP (!) 189/104 (BP Location: Right Arm)   Pulse 72   Temp 98.4 F (36.9 C) (Oral)   Resp 17   LMP 07/25/2019 (Within Weeks)   SpO2 100%   Physical Exam Vitals and nursing note reviewed.  Constitutional:      General: She  is not in acute distress.    Appearance: Normal appearance. She is well-developed. She is not toxic-appearing.  HENT:     Head: Normocephalic and atraumatic.   Eyes:     General: Lids are normal.     Conjunctiva/sclera: Conjunctivae normal.     Pupils: Pupils are equal, round, and reactive to light.   Neck:     Thyroid : No thyroid  mass.     Trachea: No tracheal deviation.   Cardiovascular:     Rate and Rhythm: Normal rate and regular rhythm.     Heart sounds: Normal heart sounds. No murmur heard.    No gallop.  Pulmonary:     Effort: Pulmonary effort is normal. No respiratory distress.     Breath sounds: Normal breath sounds. No stridor. No decreased breath sounds, wheezing, rhonchi or rales.  Abdominal:     General: There is no distension.     Palpations: Abdomen is soft.     Tenderness: There is no abdominal tenderness. There is no rebound.   Musculoskeletal:        General: No tenderness. Normal range of motion.     Cervical back: Normal range of motion and neck supple.   Skin:    General: Skin is warm and dry.     Findings: No abrasion or rash.  Neurological:     Mental Status: She is alert and oriented to person, place, and time. Mental status is at baseline.     GCS: GCS eye subscore is 4. GCS verbal subscore is 5. GCS motor subscore is 6.     Cranial Nerves: No cranial nerve deficit.     Sensory: No sensory deficit.     Motor: Motor function is intact.   Psychiatric:        Attention and Perception: Attention normal.        Speech: Speech normal.        Behavior: Behavior normal.     (all labs ordered are listed, but only abnormal results are displayed) Labs Reviewed - No data to display  EKG: EKG Interpretation Date/Time:  Friday November 17 2023 18:17:18 EDT Ventricular Rate:  71 PR Interval:  160 QRS Duration:  96 QT Interval:  444 QTC Calculation: 483 R Axis:   32  Text Interpretation: Sinus rhythm No significant change since last tracing Confirmed  by Lind Repine ([id number]) on 11/17/2023 7:10:10 PM  Radiology: No results found.   Procedures   Medications Ordered in the ED  metoCLOPramide  (REGLAN ) injection 10 mg (has no administration in time range)  diphenhydrAMINE  (BENADRYL ) injection 12.5 mg (has no administration in time range)                                    Medical Decision Making Amount and/or Complexity of Data Reviewed Labs: ordered. Radiology: ordered. ECG/medicine tests: ordered.  Risk Prescription drug management.   Patient EKG which was normal sinus rhythm.  Chest x-ray without acute findings here.  Abs are reassuring here.  No evidence of hypertensive crisis.  Blood pressures have been stable.  Will add hydrochlorothiazide  to her blood pressure regimen and she will follow-up with her doctor     Final diagnoses:  None    ED Discharge Orders     None          Lind Repine, MD 11/17/23 2015

## 2023-11-17 NOTE — ED Triage Notes (Signed)
 Pt arrives today with complaints of fatigue that has been ongoing for a few days, recently had a BP medication change, and it was making her have headaches. Was just taking labetalol  for HBP, but this also gave her headaches, so she discontinued. Pt presents with headache, blurred vision, and hypertension in triage. Denies chest pain and sob at this time

## 2023-12-08 ENCOUNTER — Other Ambulatory Visit: Payer: Self-pay | Admitting: Nurse Practitioner

## 2023-12-08 DIAGNOSIS — E1165 Type 2 diabetes mellitus with hyperglycemia: Secondary | ICD-10-CM

## 2023-12-12 NOTE — Telephone Encounter (Signed)
 Attempted to contact patient for appointment- no answer- left message to call office. Courtesy refill given Requested Prescriptions  Pending Prescriptions Disp Refills   OZEMPIC , 0.25 OR 0.5 MG/DOSE, 2 MG/3ML SOPN [Pharmacy Med Name: OZEMPIC  0.25 OR 0.5MG  DOS(2MG /3ML)] 3 mL 0    Sig: INJECT 0.5 MG INTO THE SKIN ONCE A WEEK     Endocrinology:  Diabetes - GLP-1 Receptor Agonists - semaglutide  Failed - 12/12/2023 11:50 AM      Failed - HBA1C in normal range and within 180 days    Hgb A1c MFr Bld  Date Value Ref Range Status  06/09/2023 6.7 (H) <5.7 % of total Hgb Final    Comment:    For someone without known diabetes, a hemoglobin A1c value of 6.5% or greater indicates that they may have  diabetes and this should be confirmed with a follow-up  test. . For someone with known diabetes, a value <7% indicates  that their diabetes is well controlled and a value  greater than or equal to 7% indicates suboptimal  control. A1c targets should be individualized based on  duration of diabetes, age, comorbid conditions, and  other considerations. . Currently, no consensus exists regarding use of hemoglobin A1c for diagnosis of diabetes for children. .          Failed - Valid encounter within last 6 months    Recent Outpatient Visits   None            Passed - Cr in normal range and within 360 days    Creat  Date Value Ref Range Status  06/09/2023 0.72 0.50 - 0.99 mg/dL Final   Creatinine, Ser  Date Value Ref Range Status  11/17/2023 0.77 0.44 - 1.00 mg/dL Final   Creatinine, Urine  Date Value Ref Range Status  06/09/2023 184 20 - 275 mg/dL Final

## 2023-12-18 ENCOUNTER — Other Ambulatory Visit: Payer: Self-pay | Admitting: Nurse Practitioner

## 2023-12-18 NOTE — Telephone Encounter (Unsigned)
 Copied from CRM [id number]. Topic: Clinical - Medication Refill >> Dec 18, 2023  2:07 PM Leotis ORN wrote: Medication: hydrochlorothiazide   Has the patient contacted their pharmacy? Yes 0 refills  This is the patient's preferred pharmacy:  [id number] - [place], [place] - [address] AT [facility] OF [address] & [address] [place] [address] Phone: [phone] Fax: [phone]  Is this the correct pharmacy for this prescription? Yes If no, delete pharmacy and type the correct one.   Has the prescription been filled recently? No  Is the patient out of the medication? Yes 1 tablet left   Has the patient been seen for an appointment in the last year OR does the patient have an upcoming appointment? Yes- OV scheduled for 07/17 with PCP  Can we respond through MyChart? Yes  Agent: Please be advised that Rx refills may take up to 3 business days. We ask that you follow-up with your pharmacy.

## 2023-12-20 NOTE — Telephone Encounter (Signed)
 Requested medication (s) are due for refill today: yes  Requested medication (s) are on the active medication list: yes  Last refill:  11/17/23  Future visit scheduled: no  Notes to clinic:  Unable to refill per protocol, last refill by another/ ED provider.  Routing to PCP fo     Requested Prescriptions  Pending Prescriptions Disp Refills   hydrochlorothiazide  (HYDRODIURIL ) 25 MG tablet 30 tablet 0    Sig: Take 1 tablet (25 mg total) by mouth daily.     Cardiovascular: Diuretics - Thiazide Failed - 12/20/2023 10:20 AM      Failed - Na in normal range and within 180 days    Sodium  Date Value Ref Range Status  11/17/2023 132 (L) 135 - 145 mmol/L Final         Failed - Last BP in normal range    BP Readings from Last 1 Encounters:  11/17/23 (!) 158/85         Failed - Valid encounter within last 6 months    Recent Outpatient Visits   None            Passed - Cr in normal range and within 180 days    Creat  Date Value Ref Range Status  06/09/2023 0.72 0.50 - 0.99 mg/dL Final   Creatinine, Ser  Date Value Ref Range Status  11/17/2023 0.77 0.44 - 1.00 mg/dL Final   Creatinine, Urine  Date Value Ref Range Status  06/09/2023 184 20 - 275 mg/dL Final         Passed - K in normal range and within 180 days    Potassium  Date Value Ref Range Status  11/17/2023 3.5 3.5 - 5.1 mmol/L Final

## 2023-12-21 ENCOUNTER — Encounter: Payer: Self-pay | Admitting: Nurse Practitioner

## 2023-12-21 ENCOUNTER — Ambulatory Visit: Admitting: Nurse Practitioner

## 2023-12-21 VITALS — BP 118/84 | HR 104 | Temp 98.2°F | Resp 18 | Ht 63.0 in | Wt 235.3 lb

## 2023-12-21 DIAGNOSIS — E1165 Type 2 diabetes mellitus with hyperglycemia: Secondary | ICD-10-CM | POA: Diagnosis not present

## 2023-12-21 DIAGNOSIS — Z6841 Body Mass Index (BMI) 40.0 and over, adult: Secondary | ICD-10-CM

## 2023-12-21 DIAGNOSIS — E66813 Obesity, class 3: Secondary | ICD-10-CM

## 2023-12-21 DIAGNOSIS — I10 Essential (primary) hypertension: Secondary | ICD-10-CM

## 2023-12-21 MED ORDER — OZEMPIC (1 MG/DOSE) 2 MG/1.5ML ~~LOC~~ SOPN: 2.0000 mg | PEN_INJECTOR | SUBCUTANEOUS | 1 refills | Status: DC

## 2023-12-21 MED ORDER — HYDROCHLOROTHIAZIDE 25 MG PO TABS: 25.0000 mg | ORAL_TABLET | Freq: Every day | ORAL | 1 refills | Status: DC

## 2023-12-21 NOTE — Progress Notes (Signed)
 BP 118/84   Pulse (!) 104   Temp 98.2 F (36.8 C)   Resp 18   Ht 5' 3 (1.6 m)   Wt 235 lb 4.8 oz (106.7 kg)   LMP 07/25/2019 (Within Weeks)   SpO2 93%   BMI 41.68 kg/m    Subjective:    Patient ID: Linda Thomas, female    DOB: [birth year], 43 y.o.   MRN: [id number]  HPI: Linda Thomas is a 43 y.o. female  Chief Complaint  Patient presents with   Medical Management of Chronic Issues   Medication Refill   Hypertension   Diabetes    Discussed the use of AI scribe software for clinical note transcription with the patient, who gave verbal consent to proceed.  History of Present Illness Linda Thomas is a 43 year old female with hypertension, type two diabetes, and obesity who presents for medication management.  She has a history of hypertension and was previously on labetalol  but was not having a good response. She was started on hydrochlorothiazide  25 mg daily after a recent emergency room visit for elevated blood pressure. Her blood pressure at home fluctuates but was 135/92 mmHg this morning.  She has type two diabetes and was previously on metformin. She is currently on Ozempic  0.5 mg weekly. Her blood sugar levels have been stable and not high. She is tolerating Ozempic  well but feels she may need a higher dose.  She has a history of obesity and notes her weight was 242 pounds at her last visit and is now 235 pounds.   Waist Measurement : 47 inches   Current weight: 235 lbs Current BMI: 41.68  Previous weight: 242 lbs Previous BMI: 43.01     12/21/2023   11:39 AM 06/09/2023   11:58 AM 10/17/2019   10:01 AM  Depression screen PHQ 2/9  Decreased Interest 0 0 1  Down, Depressed, Hopeless 0 0 0  PHQ - 2 Score 0 0 1  Altered sleeping 0  1  Tired, decreased energy 0  1  Change in appetite 0  1  Feeling bad or failure about yourself  0  0  Trouble concentrating 0  0  Moving slowly or fidgety/restless 0  0  Suicidal thoughts  0  0  PHQ-9 Score 0  4  Difficult doing work/chores Not difficult at all      Relevant past medical, surgical, family and social history reviewed and updated as indicated. Interim medical history since our last visit reviewed. Allergies and medications reviewed and updated.  Review of Systems  Constitutional: Negative for fever or weight change.  Respiratory: Negative for cough and shortness of breath.   Cardiovascular: Negative for chest pain or palpitations.  Gastrointestinal: Negative for abdominal pain, no bowel changes.  Musculoskeletal: Negative for gait problem or joint swelling.  Skin: Negative for rash.  Neurological: Negative for dizziness or headache.  No other specific complaints in a complete review of systems (except as listed in HPI above).      Objective:     BP 118/84   Pulse (!) 104   Temp 98.2 F (36.8 C)   Resp 18   Ht 5' 3 (1.6 m)   Wt 235 lb 4.8 oz (106.7 kg)   LMP 07/25/2019 (Within Weeks)   SpO2 93%   BMI 41.68 kg/m    Wt Readings from Last 3 Encounters:  12/21/23 235 lb 4.8 oz (106.7 kg)  07/17/23 237 lb 7 oz (107.7 kg)  06/15/23 241 lb (109.3 kg)    Physical Exam Physical Exam MEASUREMENTS: Weight- 235. GENERAL: Alert, cooperative, well developed, no acute distress HEENT: Normocephalic, normal oropharynx, moist mucous membranes CHEST: Clear to auscultation bilaterally, No wheezes, rhonchi, or crackles CARDIOVASCULAR: Normal heart rate and rhythm, S1 and S2 normal without murmurs ABDOMEN: Soft, non-tender, non-distended, without organomegaly, Normal bowel sounds EXTREMITIES: No cyanosis or edema NEUROLOGICAL: Cranial nerves grossly intact, Moves all extremities without gross motor or sensory deficit   Results for orders placed or performed during the hospital encounter of 11/17/23  CBC with Differential/Platelet   Collection Time: 11/17/23  5:44 PM  Result Value Ref Range   WBC 6.1 4.0 - 10.5 K/uL   RBC 4.42 3.87 - 5.11 MIL/uL    Hemoglobin 12.1 12.0 - 15.0 g/dL   HCT 60.3 63.9 - 53.9 %   MCV 89.6 80.0 - 100.0 fL   MCH 27.4 26.0 - 34.0 pg   MCHC 30.6 30.0 - 36.0 g/dL   RDW 85.6 88.4 - 84.4 %   Platelets 199 150 - 400 K/uL   nRBC 0.0 0.0 - 0.2 %   Neutrophils Relative % 47 %   Neutro Abs 2.9 1.7 - 7.7 K/uL   Lymphocytes Relative 41 %   Lymphs Abs 2.5 0.7 - 4.0 K/uL   Monocytes Relative 9 %   Monocytes Absolute 0.5 0.1 - 1.0 K/uL   Eosinophils Relative 2 %   Eosinophils Absolute 0.1 0.0 - 0.5 K/uL   Basophils Relative 1 %   Basophils Absolute 0.0 0.0 - 0.1 K/uL   Immature Granulocytes 0 %   Abs Immature Granulocytes 0.01 0.00 - 0.07 K/uL  Basic metabolic panel with GFR   Collection Time: 11/17/23  6:30 PM  Result Value Ref Range   Sodium 132 (L) 135 - 145 mmol/L   Potassium 3.5 3.5 - 5.1 mmol/L   Chloride 103 98 - 111 mmol/L   CO2 20 (L) 22 - 32 mmol/L   Glucose, Bld 84 70 - 99 mg/dL   BUN 15 6 - 20 mg/dL   Creatinine, Ser 9.22 0.44 - 1.00 mg/dL   Calcium  8.3 (L) 8.9 - 10.3 mg/dL   GFR, Estimated >39 >39 mL/min   Anion gap 9 5 - 15          Assessment & Plan:   Problem List Items Addressed This Visit       Cardiovascular and Mediastinum   Essential hypertension   Relevant Medications   hydrochlorothiazide  (HYDRODIURIL ) 25 MG tablet     Endocrine   Type 2 diabetes mellitus with hyperglycemia, without long-term current use of insulin  (HCC) - Primary   Relevant Medications   Semaglutide , 1 MG/DOSE, (OZEMPIC , 1 MG/DOSE,) 2 MG/1.5ML SOPN   Other Visit Diagnoses       Class 3 severe obesity due to excess calories with serious comorbidity and body mass index (BMI) of 40.0 to 44.9 in adult       Relevant Medications   Semaglutide , 1 MG/DOSE, (OZEMPIC , 1 MG/DOSE,) 2 MG/1.5ML SOPN        Assessment and Plan Assessment & Plan Type 2 diabetes mellitus Type 2 diabetes is being treated with Ozempic . She reports stable blood glucose levels and tolerates the medication well. She has been on 0.5  mg weekly and expresses a need for a higher dose. - Increase Ozempic  to 1 mg weekly - Ensure prescription for 1 mg Ozempic  is filled at the pharmacy  Obesity Obesity is being addressed with Ozempic , contributing  to weight loss. Her weight has decreased from 242 lbs to 235 lbs since the last visit. - Continue Ozempic  as part of weight management strategy  Hypertension Hypertension is managed with hydrochlorothiazide  25 mg daily. Previous treatment with labetalol  was ineffective, and she was noncompliant. Recent home blood pressure readings are around 135/92 mmHg, slightly elevated. - Continue hydrochlorothiazide  25 mg daily        Follow up plan: Return in about 4 months (around 04/22/2024) for follow up.

## 2024-04-18 ENCOUNTER — Ambulatory Visit
Admission: EM | Admit: 2024-04-18 | Discharge: 2024-04-18 | Disposition: A | Discharge status: Home / Self Care | Attending: Nurse Practitioner | Admitting: Nurse Practitioner

## 2024-04-18 DIAGNOSIS — J029 Acute pharyngitis, unspecified: Secondary | ICD-10-CM | POA: Insufficient documentation

## 2024-04-18 DIAGNOSIS — R051 Acute cough: Secondary | ICD-10-CM | POA: Insufficient documentation

## 2024-04-18 LAB — POC COVID19/FLU A&B COMBO
Covid Antigen, POC: NEGATIVE
Influenza A Antigen, POC: NEGATIVE
Influenza B Antigen, POC: NEGATIVE

## 2024-04-18 LAB — POCT RAPID STREP A (OFFICE): Rapid Strep A Screen: NEGATIVE

## 2024-04-18 LAB — POCT MONO SCREEN (KUC): Mono, POC: NEGATIVE

## 2024-04-18 MED ORDER — LIDOCAINE VISCOUS HCL 2 % MT SOLN: 15.0000 mL | OROMUCOSAL | 0 refills | Status: AC | PRN

## 2024-04-18 MED ORDER — BENZONATATE 100 MG PO CAPS: 100.0000 mg | ORAL_CAPSULE | Freq: Three times a day (TID) | ORAL | 0 refills | Status: AC | PRN

## 2024-04-18 NOTE — ED Provider Notes (Signed)
 [facility]    CSN: [id number] Arrival date & time: 04/18/24  0902      History   Chief Complaint No chief complaint on file.   HPI Linda Thomas is a 43 y.o. female.   Patient presents today with 1 day history of congested cough, nasal congestion, postnasal drainage, sore throat, left ear pain, decreased appetite, and fatigue.  No fever, body aches or chills, chest pain or tightness, runny nose, nausea/vomiting, or diarrhea.  Reports her husband has been sick for the past 2 weeks but he has not come in to be evaluated.  Reports she is around her grandchildren frequently.  Has taken Alka-Seltzer plus for symptoms without much improvement.  Patient acknowledges elevated blood pressure today.  Reports she checks her blood pressure occasionally when she takes her medication regularly and is typically 145/92.  She has a follow-up appointment with her primary care provider in a few days.  No chest pain, shortness of breath, vision changes, lightheadedness or dizziness, or significant headache.    Past Medical History:  Diagnosis Date   Bell's palsy    Diabetes mellitus without complication (HCC)    Hyperglycemia    A1c 6.4% 08/30/19   Hypertension during pregnancy   Hypertension    Idiopathic intracranial hypertension    Migraine    Sleep apnea     Patient Active Problem List   Diagnosis Date Noted   Type 2 diabetes mellitus with hyperglycemia, without long-term current use of insulin  (HCC) 06/09/2023   Incarcerated hernia 09/21/2022   S/P TAH (total abdominal hysterectomy) 09/03/2019   BMI 40.0-44.9, adult (HCC) 04/01/2019   Essential hypertension 06/21/2016    Past Surgical History:  Procedure Laterality Date   CARPAL TUNNEL RELEASE Right 07/17/2023   Procedure: RIGHT CARPAL TUNNEL RELEASE;  Surgeon: Murrell Drivers, MD;  Location: [facility];  Service: Orthopedics;  Laterality: Right;  30 MIN   CESAREAN SECTION     x2   HYSTERECTOMY  ABDOMINAL WITH SALPINGECTOMY Bilateral 09/03/2019   Procedure: HYSTERECTOMY ABDOMINAL WITH BILATERAL SALPINGECTOMY;  Surgeon: Herchel Gloris LABOR, MD;  Location: [facility] OR;  Service: Gynecology;  Laterality: Bilateral;   INCISIONAL HERNIA REPAIR N/A 09/21/2022   Procedure: DIAGNOSTIC LAPAROSCOPY;  Surgeon: Vernetta Berg, MD;  Location: [facility] OR;  Service: General;  Laterality: N/A;   INCISIONAL HERNIA REPAIR N/A 09/21/2022   Procedure: OPEN HERNIA REPAIR INCISIONAL;  Surgeon: Vernetta Berg, MD;  Location: [facility] OR;  Service: General;  Laterality: N/A;   LAPAROSCOPY N/A 09/03/2019   Procedure: Laparoscopy Diagnostic;  Surgeon: Herchel Gloris LABOR, MD;  Location: [facility] OR;  Service: Gynecology;  Laterality: N/A;   TUBAL LIGATION      OB History     Gravida  2   Para  2   Term  2   Preterm      AB      Living         SAB      IAB      Ectopic      Multiple      Live Births               Home Medications    Prior to Admission medications   Medication Sig Start Date End Date Taking? Authorizing Provider  benzonatate (TESSALON) 100 MG capsule Take 1 capsule (100 mg total) by mouth 3 (three) times daily as needed for cough. Do not take with alcohol or while operating or driving heavy machinery 88/86/74  Yes  Chandra Raisin A, NP  lidocaine  (XYLOCAINE ) 2 % solution Use as directed 15 mLs in the mouth or throat every 3 (three) hours as needed for mouth pain. Gargle and spit as needed for throat pain 04/18/24  Yes Chandra Raisin A, NP  acetaminophen  (TYLENOL ) 500 MG tablet Take 2 tablets (1,000 mg total) by mouth every 6 (six) hours as needed for mild pain. 09/26/22   Meuth, Brooke A, PA-C  hydrochlorothiazide  (HYDRODIURIL ) 25 MG tablet Take 1 tablet (25 mg total) by mouth daily. 12/21/23   Pender, Julie F, FNP  ibuprofen  (ADVIL ) 800 MG tablet Take 1 tablet (800 mg total) by mouth 3 (three) times daily with meals as needed for headache, mild pain, moderate pain or cramping. 09/05/19    Anyanwu, Ugonna A, MD  Semaglutide , 1 MG/DOSE, (OZEMPIC , 1 MG/DOSE,) 2 MG/1.5ML SOPN Inject 2 mg into the skin once a week. 12/21/23   Gareth Mliss FALCON, FNP    Family History Family History  Problem Relation Age of Onset   Lung cancer Father    Bleeding Disorder Neg Hx    Pseudotumor cerebri Neg Hx     Social History Social History   Tobacco Use   Smoking status: Former    Current packs/day: 0.00    Types: Cigarettes    Quit date: 2014    Years since quitting: 11.8   Smokeless tobacco: Never  Vaping Use   Vaping status: Some Days   Substances: Mixture of cannabinoids  Substance Use Topics   Alcohol use: Yes    Comment: social   Drug use: No    Comment: THC pen every few weeks     Allergies   Other and Penicillins   Review of Systems Review of Systems Per HPI  Physical Exam Triage Vital Signs ED Triage Vitals  Encounter Vitals Group     BP 04/18/24 0930 (!) 159/105     Girls Systolic BP Percentile --      Girls Diastolic BP Percentile --      Boys Systolic BP Percentile --      Boys Diastolic BP Percentile --      Pulse Rate 04/18/24 0930 (!) 104     Resp 04/18/24 0930 20     Temp 04/18/24 0930 99.4 F (37.4 C)     Temp Source 04/18/24 0930 Oral     SpO2 04/18/24 0930 97 %     Weight --      Height --      Head Circumference --      Peak Flow --      Pain Score 04/18/24 0932 7     Pain Loc --      Pain Education --      Exclude from Growth Chart --    No data found.  Updated Vital Signs BP (!) 159/105 (BP Location: Right Arm)   Pulse (!) 104   Temp 99.4 F (37.4 C) (Oral)   Resp 20   LMP 07/25/2019 (Within Weeks)   SpO2 97%   Visual Acuity Right Eye Distance:   Left Eye Distance:   Bilateral Distance:    Right Eye Near:   Left Eye Near:    Bilateral Near:     Physical Exam Vitals and nursing note reviewed.  Constitutional:      General: She is not in acute distress.    Appearance: Normal appearance. She is not ill-appearing or  toxic-appearing.  HENT:     Head: Normocephalic and atraumatic.  Right Ear: Tympanic membrane, ear canal and external ear normal.     Left Ear: Tympanic membrane, ear canal and external ear normal.     Nose: No congestion or rhinorrhea.     Mouth/Throat:     Mouth: Mucous membranes are moist.     Pharynx: Oropharynx is clear. Posterior oropharyngeal erythema present. No oropharyngeal exudate or postnasal drip.  Eyes:     General: No scleral icterus.    Extraocular Movements: Extraocular movements intact.  Cardiovascular:     Rate and Rhythm: Normal rate and regular rhythm.  Pulmonary:     Effort: Pulmonary effort is normal. No respiratory distress.     Breath sounds: Normal breath sounds. No wheezing, rhonchi or rales.  Musculoskeletal:     Cervical back: Normal range of motion and neck supple.  Lymphadenopathy:     Cervical: No cervical adenopathy.  Skin:    General: Skin is warm and dry.     Coloration: Skin is not jaundiced or pale.     Findings: No erythema or rash.  Neurological:     Mental Status: She is alert and oriented to person, place, and time.  Psychiatric:        Behavior: Behavior is cooperative.      UC Treatments / Results  Labs (all labs ordered are listed, but only abnormal results are displayed) Labs Reviewed  CULTURE, GROUP A STREP ([facility])  POC COVID19/FLU A&B COMBO  POCT RAPID STREP A (OFFICE)  POCT MONO SCREEN ([facility])    EKG   Radiology No results found.  Procedures Procedures (including critical care time)  Medications Ordered in UC Medications - No data to display  Initial Impression / Assessment and Plan / UC Course  I have reviewed the triage vital signs and the nursing notes.  Pertinent labs & imaging results that were available during my care of the patient were reviewed by me and considered in my medical decision making (see chart for details).   In triage, patient is mildly hypertensive and tachycardic, likely due to acute  illness.  Otherwise, vital signs are stable and patient is well-appearing.  Her symptoms today are consistent with a viral upper respiratory infection.  Rapid strep throat test is negative, COVID-19 and influenza test is negative, and mononucleosis screen is negative.  Strep throat culture is pending given posterior pharynx erythema.  Supportive care was discussed with the patient, start lidocaine  rinses to help with throat pain as well as cough suppressant medication.  Strict ER and return precautions discussed with patient additionally.  Work excuse was provided.  The patient was given the opportunity to ask questions.  All questions answered to their satisfaction.  The patient is in agreement to this plan.   Final Clinical Impressions(s) / UC Diagnoses   Final diagnoses:  Acute cough  Acute pharyngitis, unspecified etiology     Discharge Instructions      You have a viral upper respiratory infection.  Symptoms should improve over the next week to 10 days.  If you develop chest pain or shortness of breath, go to the emergency room.  Strep throat test is negative, COVID-19 and influenza test is negative, and mono testing is negative.  The throat culture is pending; we will contact you in the next few days if positive and will prescribe treatment at that time the throat still hurts.   Some things that can make you feel better are: - Increased rest - Increasing fluid with water/sugar free electrolytes - Acetaminophen  and  ibuprofen  as needed for fever/pain - Salt water gargling, chloraseptic spray and throat lozenges - OTC guaifenesin  (Mucinex ) 600 mg twice daily for congestion - Saline sinus flushes or a neti pot - Humidifying the air -Tessalon Perles every 8 hours as needed for dry cough      ED Prescriptions     Medication Sig Dispense Auth. Provider   lidocaine  (XYLOCAINE ) 2 % solution Use as directed 15 mLs in the mouth or throat every 3 (three) hours as needed for mouth pain.  Gargle and spit as needed for throat pain 100 mL Chandra Raisin A, NP   benzonatate (TESSALON) 100 MG capsule Take 1 capsule (100 mg total) by mouth 3 (three) times daily as needed for cough. Do not take with alcohol or while operating or driving heavy machinery 21 capsule Chandra Raisin LABOR, NP      PDMP not reviewed this encounter.   Chandra Raisin LABOR, NP 04/18/24 [phone]

## 2024-04-18 NOTE — ED Triage Notes (Signed)
 Pt reports sore throat,headache, cough congestion  x 1 day hs gotten worse since this morning. Denies fever

## 2024-04-18 NOTE — Discharge Instructions (Addendum)
 You have a viral upper respiratory infection.  Symptoms should improve over the next week to 10 days.  If you develop chest pain or shortness of breath, go to the emergency room.  Strep throat test is negative, COVID-19 and influenza test is negative, and mono testing is negative.  The throat culture is pending; we will contact you in the next few days if positive and will prescribe treatment at that time the throat still hurts.   Some things that can make you feel better are: - Increased rest - Increasing fluid with water/sugar free electrolytes - Acetaminophen  and ibuprofen  as needed for fever/pain - Salt water gargling, chloraseptic spray and throat lozenges - OTC guaifenesin  (Mucinex ) 600 mg twice daily for congestion - Saline sinus flushes or a neti pot - Humidifying the air -Tessalon Perles every 8 hours as needed for dry cough

## 2024-04-21 LAB — CULTURE, GROUP A STREP (THRC)

## 2024-04-22 ENCOUNTER — Ambulatory Visit: Admitting: Internal Medicine

## 2024-04-22 ENCOUNTER — Encounter: Payer: Self-pay | Admitting: Internal Medicine

## 2024-04-22 ENCOUNTER — Ambulatory Visit (HOSPITAL_COMMUNITY): Payer: Self-pay

## 2024-04-22 ENCOUNTER — Other Ambulatory Visit: Payer: Self-pay

## 2024-04-22 VITALS — BP 120/80 | HR 82 | Temp 98.6°F | Resp 16 | Ht 63.0 in | Wt 217.8 lb

## 2024-04-22 DIAGNOSIS — E1165 Type 2 diabetes mellitus with hyperglycemia: Secondary | ICD-10-CM

## 2024-04-22 DIAGNOSIS — J029 Acute pharyngitis, unspecified: Secondary | ICD-10-CM | POA: Diagnosis not present

## 2024-04-22 DIAGNOSIS — E66812 Obesity, class 2: Secondary | ICD-10-CM

## 2024-04-22 DIAGNOSIS — I10 Essential (primary) hypertension: Secondary | ICD-10-CM | POA: Diagnosis not present

## 2024-04-22 DIAGNOSIS — J069 Acute upper respiratory infection, unspecified: Secondary | ICD-10-CM | POA: Diagnosis not present

## 2024-04-22 DIAGNOSIS — Z1231 Encounter for screening mammogram for malignant neoplasm of breast: Secondary | ICD-10-CM

## 2024-04-22 DIAGNOSIS — Z1211 Encounter for screening for malignant neoplasm of colon: Secondary | ICD-10-CM

## 2024-04-22 DIAGNOSIS — Z6838 Body mass index (BMI) 38.0-38.9, adult: Secondary | ICD-10-CM | POA: Diagnosis not present

## 2024-04-22 DIAGNOSIS — Z7985 Long-term (current) use of injectable non-insulin antidiabetic drugs: Secondary | ICD-10-CM

## 2024-04-22 LAB — POCT GLYCOSYLATED HEMOGLOBIN (HGB A1C): Hemoglobin A1C: 5.7 % — AB (ref 4.0–5.6)

## 2024-04-22 LAB — POCT RAPID STREP A (OFFICE): Rapid Strep A Screen: NEGATIVE

## 2024-04-22 MED ORDER — OZEMPIC (1 MG/DOSE) 2 MG/1.5ML ~~LOC~~ SOPN: 2.0000 mg | PEN_INJECTOR | SUBCUTANEOUS | 1 refills | Status: DC

## 2024-04-22 MED ORDER — METHYLPREDNISOLONE 4 MG PO TBPK: ORAL_TABLET | ORAL | 0 refills | Status: AC

## 2024-04-22 MED ORDER — HYDROCHLOROTHIAZIDE 25 MG PO TABS: 25.0000 mg | ORAL_TABLET | Freq: Every day | ORAL | 1 refills | Status: AC

## 2024-04-22 NOTE — Progress Notes (Signed)
 Established Patient Office Visit  Subjective   Patient ID: Linda Thomas, female    DOB: Sep 27, 1980  Age: 43 y.o. MRN: [id number]  Chief Complaint  Patient presents with   Medical Management of Chronic Issues   URI    Follow up seen at [facility], covid and flu negative   Eye Problem    Right eye irritation    URI  Associated symptoms include congestion and a sore throat.  Eye Problem     Patient is here for follow up on chronic medical conditions.   Discussed the use of AI scribe software for clinical note transcription with the patient, who gave verbal consent to proceed.  History of Present Illness Linda Thomas is a 43 year old female who presents for a routine follow-up appointment.  She experiences increased urination at home but not at work. Her diabetes is managed with a 2 mg dose of Ozempic  for two months, resulting in weight loss from 236 lbs to 217 lbs since July and an A1c improvement from 6.7% to 5.7%. She does not experience acid reflux or constipation. She does not regularly check her blood sugars due to the well-controlled A1c.  She recently visited urgent care for a sore throat, which tested negative for flu, strep, and COVID. The throat pain started on one side and then spread. She reports waking up with her eye shut and yellow-green discharge. She has been out of work since Thursday and needs a note to extend her leave.  She has a history of a retinal hole requiring laser treatment and understands the importance of annual eye exams. No numbness or tingling in her feet, and she has not had a foot exam before. She had her toenails removed due to severe ingrown toenails. She prefers the Cologuard test over a colonoscopy for colon cancer screening.   Hypertension: -Medications: hydrochlorothiazide  25 mg -Patient is compliant with above medications and reports no side effects. -Denies any SOB, CP, vision changes, LE edema or symptoms of  hypotension  Diabetes, Type 2: -Last A1c 6.7% 1/25 -Medications: Ozempic  2 mg weekly  -Patient is compliant with the above medications and reports no side effects.  -Checking BG at home: not checking -Eye exam: Due, has a provider, will call and schedule  -Foot exam: Due -Microalbumin: UTD 1/25  -Statin: no -PNA vaccine: Discuss at follow up -Denies symptoms of hypoglycemia, polyuria, polydipsia, numbness extremities, foot ulcers/trauma.   Patient Active Problem List   Diagnosis Date Noted   Type 2 diabetes mellitus with hyperglycemia, without long-term current use of insulin  (HCC) 06/09/2023   Incarcerated hernia 09/21/2022   S/P TAH (total abdominal hysterectomy) 09/03/2019   BMI 40.0-44.9, adult (HCC) 04/01/2019   Essential hypertension 06/21/2016   Past Medical History:  Diagnosis Date   Bell's palsy    Diabetes mellitus without complication (HCC)    Hyperglycemia    A1c 6.4% 08/30/19   Hypertension during pregnancy   Hypertension    Idiopathic intracranial hypertension    Migraine    Sleep apnea    Past Surgical History:  Procedure Laterality Date   CARPAL TUNNEL RELEASE Right 07/17/2023   Procedure: RIGHT CARPAL TUNNEL RELEASE;  Surgeon: Murrell Drivers, MD;  Location: [facility];  Service: Orthopedics;  Laterality: Right;  30 MIN   CESAREAN SECTION     x2   HYSTERECTOMY ABDOMINAL WITH SALPINGECTOMY Bilateral 09/03/2019   Procedure: HYSTERECTOMY ABDOMINAL WITH BILATERAL SALPINGECTOMY;  Surgeon: Herchel Gloris LABOR, MD;  Location: [facility]  OR;  Service: Gynecology;  Laterality: Bilateral;   INCISIONAL HERNIA REPAIR N/A 09/21/2022   Procedure: DIAGNOSTIC LAPAROSCOPY;  Surgeon: Vernetta Berg, MD;  Location: [facility] OR;  Service: General;  Laterality: N/A;   INCISIONAL HERNIA REPAIR N/A 09/21/2022   Procedure: OPEN HERNIA REPAIR INCISIONAL;  Surgeon: Vernetta Berg, MD;  Location[facility] OR;  Service: General;  Laterality: N/A;   LAPAROSCOPY N/A 09/03/2019   Procedure:  Laparoscopy Diagnostic;  Surgeon: Herchel Gloris LABOR, MD;  Location: [facility] OR;  Service: Gynecology;  Laterality: N/A;   TUBAL LIGATION     Social History   Tobacco Use   Smoking status: Former    Current packs/day: 0.00    Types: Cigarettes    Quit date: 2014    Years since quitting: 11.8   Smokeless tobacco: Never  Vaping Use   Vaping status: Some Days   Substances: Mixture of cannabinoids  Substance Use Topics   Alcohol use: Yes    Comment: social   Drug use: No    Comment: THC pen every few weeks   Social History   Socioeconomic History   Marital status: Single    Spouse name: Not on file   Number of children: 2   Years of education: Not on file   Highest education level: 11th grade  Occupational History   Not on file  Tobacco Use   Smoking status: Former    Current packs/day: 0.00    Types: Cigarettes    Quit date: 2014    Years since quitting: 11.8   Smokeless tobacco: Never  Vaping Use   Vaping status: Some Days   Substances: Mixture of cannabinoids  Substance and Sexual Activity   Alcohol use: Yes    Comment: social   Drug use: No    Comment: THC pen every few weeks   Sexual activity: Yes    Birth control/protection: Surgical  Other Topics Concern   Not on file  Social History Narrative   Lives at home with her children & her niece   Right handed   Caffeine: occasional    Social Drivers of Corporate Investment Banker Strain: Not on file  Food Insecurity: Low Risk  (08/30/2023)   Received from [facility]   Hunger Vital Sign    Within the past 12 months, you worried that your food would run out before you got money to buy more: Never true    Within the past 12 months, the food you bought just didn't last and you didn't have money to get more. : Never true  Transportation Needs: No Transportation Needs (08/30/2023)   Received from [facility]    In the past 12 months, has lack of reliable transportation kept you from medical  appointments, meetings, work or from getting things needed for daily living? : No  Physical Activity: Not on file  Stress: Not on file  Social Connections: Not on file  Intimate Partner Violence: Not At Risk (09/25/2022)   Humiliation, Afraid, Rape, and Kick questionnaire    Fear of Current or Ex-Partner: No    Emotionally Abused: No    Physically Abused: No    Sexually Abused: No   Family Status  Relation Name Status   Mother  Alive   Father  Deceased at age 76   Neg Hx  (Not Specified)  No partnership data on file   Family History  Problem Relation Age of Onset   Lung cancer Father    Bleeding Disorder  Neg Hx    Pseudotumor cerebri Neg Hx    Allergies  Allergen Reactions   Other Itching    Reaction to unknown antibiotic at age 29   Penicillins Itching    Has patient had a PCN reaction causing immediate rash, facial/tongue/throat swelling, SOB or lightheadedness with hypotension: No Has patient had a PCN reaction causing severe rash involving mucus membranes or skin necrosis: No Has patient had a PCN reaction that required hospitalization: No Has patient had a PCN reaction occurring within the last 10 years: No If all of the above answers are NO, then may proceed with Cephalosporin use.       Review of Systems  HENT:  Positive for congestion and sore throat.       Objective:     BP 120/80 (Cuff Size: Large)   Pulse 82   Temp 98.6 F (37 C) (Oral)   Resp 16   Ht 5' 3 (1.6 m)   Wt 217 lb 12.8 oz (98.8 kg)   LMP 07/25/2019 (Within Weeks)   SpO2 98%   BMI 38.58 kg/m  BP Readings from Last 3 Encounters:  04/22/24 120/80  04/18/24 (!) 159/105  12/21/23 118/84   Wt Readings from Last 3 Encounters:  04/22/24 217 lb 12.8 oz (98.8 kg)  12/21/23 235 lb 4.8 oz (106.7 kg)  07/17/23 237 lb 7 oz (107.7 kg)      Physical Exam Constitutional:      Appearance: Normal appearance.  HENT:     Head: Normocephalic and atraumatic.     Right Ear: Ear canal and  external ear normal.     Left Ear: Ear canal and external ear normal.     Ears:     Comments: Fluid behind bilateral TM's    Nose: Congestion present.     Mouth/Throat:     Mouth: Mucous membranes are moist.     Pharynx: Posterior oropharyngeal erythema present.     Comments: 2 small tonsil stones on left Eyes:     Conjunctiva/sclera: Conjunctivae normal.  Cardiovascular:     Rate and Rhythm: Normal rate and regular rhythm.     Pulses:          Dorsalis pedis pulses are 2+ on the right side and 2+ on the left side.  Pulmonary:     Effort: Pulmonary effort is normal.     Breath sounds: Normal breath sounds.  Musculoskeletal:     Right foot: Normal range of motion. No deformity, bunion, Charcot foot, foot drop or prominent metatarsal heads.     Left foot: Normal range of motion. No deformity, bunion, Charcot foot, foot drop or prominent metatarsal heads.  Feet:     Right foot:     Protective Sensation: 6 sites tested.  6 sites sensed.     Skin integrity: Skin integrity normal.     Toenail Condition: Right toenails are normal.     Left foot:     Protective Sensation: 6 sites tested.  6 sites sensed.     Skin integrity: Skin integrity normal.     Toenail Condition: Left toenails are normal.  Skin:    General: Skin is warm and dry.  Neurological:     General: No focal deficit present.     Mental Status: She is alert. Mental status is at baseline.  Psychiatric:        Mood and Affect: Mood normal.        Behavior: Behavior normal.  Results for orders placed or performed in visit on 04/22/24  POCT HgB A1C  Result Value Ref Range   Hemoglobin A1C 5.7 (A) 4.0 - 5.6 %   HbA1c POC (<> result, manual entry)     HbA1c, POC (prediabetic range)     HbA1c, POC (controlled diabetic range)      Last CBC Lab Results  Component Value Date   WBC 6.1 11/17/2023   HGB 12.1 11/17/2023   HCT 39.6 11/17/2023   MCV 89.6 11/17/2023   MCH 27.4 11/17/2023   RDW 14.3 11/17/2023   PLT  199 11/17/2023   Last metabolic panel Lab Results  Component Value Date   GLUCOSE 84 11/17/2023   NA 132 (L) 11/17/2023   K 3.5 11/17/2023   CL 103 11/17/2023   CO2 20 (L) 11/17/2023   BUN 15 11/17/2023   CREATININE 0.77 11/17/2023   GFRNONAA >60 11/17/2023   CALCIUM  8.3 (L) 11/17/2023   PROT 7.0 06/09/2023   ALBUMIN  3.8 09/21/2022   BILITOT 0.3 06/09/2023   ALKPHOS 83 09/21/2022   AST 17 06/09/2023   ALT 19 06/09/2023   ANIONGAP 9 11/17/2023   Last lipids Lab Results  Component Value Date   CHOL 204 (H) 06/09/2023   HDL 50 06/09/2023   LDLCALC 132 (H) 06/09/2023   TRIG 109 06/09/2023   CHOLHDL 4.1 06/09/2023   Last hemoglobin A1c Lab Results  Component Value Date   HGBA1C 5.7 (A) 04/22/2024   Last thyroid  functions Lab Results  Component Value Date   TSH 4.18 06/09/2023   FREET4 0.86 11/17/2016   Last vitamin D No results found for: 25OHVITD2, 25OHVITD3, VD25OH Last vitamin B12 and Folate Lab Results  Component Value Date   VITAMINB12 457 04/17/2019   FOLATE 14.8 04/17/2019      The 10-year ASCVD risk score (Arnett DK, et al., 2019) is: 4.9%    Assessment & Plan:   Assessment & Plan Acute pharyngitis, left-sided with viral conjunctivitis and acute serous otitis media Left-sided pharyngitis with redness and tonsil stones. Viral conjunctivitis with discharge. Acute serous otitis media with fluid, no infection. Likely viral, possibly adenovirus. Negative for COVID, flu, and strep. - Performed throat culture to rule out bacterial infection. - Rapid strep negative, treat with Medrol  dose pack for symptom relief.  - Provided work note for absence.  Type 2 diabetes mellitus with hyperglycemia Type 2 diabetes well-controlled. A1c improved from 6.7% to 5.7%. On 2 mg Ozempic , no side effects. Weight loss plateau. Discussed Mounjaro  for further weight loss, insurance coverage uncertain. Current regimen effective. - Continue Ozempic  2 mg. - Ensure annual  eye exam. - Conduct annual foot exam. - Encouraged daily foot self-exams.  Essential hypertension Hypertension well-controlled with hydrochlorothiazide . Blood pressure 120/80 mmHg. Discussed potential side effects, including increased urination and kidney impact. Annual kidney function monitoring necessary. - Continue hydrochlorothiazide . - Monitor kidney function annually.  Obesity, class 3 Class 3 obesity with recent weight loss from 236 lbs to 217 lbs. Weight loss plateau. Discussed Mounjaro  for further weight loss, insurance coverage uncertain. Current regimen with Ozempic  effective. - Reassess weight management options at next visit.  - POCT HgB A1C - HM Diabetes Foot Exam - Semaglutide , 1 MG/DOSE, (OZEMPIC , 1 MG/DOSE,) 2 MG/1.5ML SOPN; Inject 2 mg into the skin once a week.  Dispense: 9 mL; Refill: 1 - hydrochlorothiazide  (HYDRODIURIL ) 25 MG tablet; Take 1 tablet (25 mg total) by mouth daily.  Dispense: 90 tablet; Refill: 1 - MM 3D SCREENING MAMMOGRAM BILATERAL BREAST;  Future - Ambulatory referral to Gastroenterology - methylPREDNISolone  (MEDROL  DOSEPAK) 4 MG TBPK tablet; Use as directed.  Dispense: 21 each; Refill: 0 - POCT rapid strep A - Culture, Group A Strep     Return in about 6 months (around 10/20/2024).    Sharyle Fischer, DO

## 2024-04-23 ENCOUNTER — Other Ambulatory Visit: Payer: Self-pay | Admitting: Internal Medicine

## 2024-04-23 DIAGNOSIS — E1165 Type 2 diabetes mellitus with hyperglycemia: Secondary | ICD-10-CM

## 2024-04-23 DIAGNOSIS — E66812 Obesity, class 2: Secondary | ICD-10-CM

## 2024-04-23 MED ORDER — SEMAGLUTIDE (2 MG/DOSE) 8 MG/3ML ~~LOC~~ SOPN: 2.0000 mg | PEN_INJECTOR | SUBCUTANEOUS | 1 refills | Status: AC

## 2024-04-24 ENCOUNTER — Ambulatory Visit: Payer: Self-pay | Admitting: Internal Medicine

## 2024-04-24 ENCOUNTER — Other Ambulatory Visit: Payer: Self-pay

## 2024-04-24 ENCOUNTER — Telehealth: Payer: Self-pay

## 2024-04-24 DIAGNOSIS — Z1211 Encounter for screening for malignant neoplasm of colon: Secondary | ICD-10-CM

## 2024-04-24 LAB — CULTURE, GROUP A STREP
Micro Number: 17247342
SPECIMEN QUALITY:: ADEQUATE

## 2024-04-24 MED ORDER — NA SULFATE-K SULFATE-MG SULF 17.5-3.13-1.6 GM/177ML PO SOLN: 1.0000 | Freq: Once | ORAL | 0 refills | Status: AC

## 2024-04-24 NOTE — Telephone Encounter (Signed)
 Gastroenterology Pre-Procedure Review  Request Date: 06/11/24 Requesting Physician: Dr. JINNY  PATIENT REVIEW QUESTIONS: The patient responded to the following health history questions as indicated:    1. Are you having any GI issues? no 2. Do you have a personal history of Polyps? no 3. Do you have a family history of Colon Cancer or Polyps? no 4. Diabetes Mellitus? yes (Takes semaglutide  has been advised to stop 7 days prior) 5. Joint replacements in the past 12 months?no 6. Major health problems in the past 3 months?[facility] on11/13 cough, 11/17/23 ER visit for HTN 7. Any artificial heart valves, MVP, or defibrillator?no    MEDICATIONS & ALLERGIES:    Patient reports the following regarding taking any anticoagulation/antiplatelet therapy:   Plavix, Coumadin, Eliquis, Xarelto, Lovenox , Pradaxa, Brilinta, or Effient? no Aspirin ? no  Patient confirms/reports the following medications:  Current Outpatient Medications  Medication Sig Dispense Refill   acetaminophen  (TYLENOL ) 500 MG tablet Take 2 tablets (1,000 mg total) by mouth every 6 (six) hours as needed for mild pain. 30 tablet 0   benzonatate (TESSALON) 100 MG capsule Take 1 capsule (100 mg total) by mouth 3 (three) times daily as needed for cough. Do not take with alcohol or while operating or driving heavy machinery 21 capsule 0   hydrochlorothiazide  (HYDRODIURIL ) 25 MG tablet Take 1 tablet (25 mg total) by mouth daily. 90 tablet 1   ibuprofen  (ADVIL ) 800 MG tablet Take 1 tablet (800 mg total) by mouth 3 (three) times daily with meals as needed for headache, mild pain, moderate pain or cramping. 30 tablet 2   lidocaine  (XYLOCAINE ) 2 % solution Use as directed 15 mLs in the mouth or throat every 3 (three) hours as needed for mouth pain. Gargle and spit as needed for throat pain 100 mL 0   methylPREDNISolone  (MEDROL  DOSEPAK) 4 MG TBPK tablet Use as directed. 21 each 0   Semaglutide , 2 MG/DOSE, 8 MG/3ML SOPN Inject 2 mg as directed once a  week. 6 mL 1   No current facility-administered medications for this visit.    Patient confirms/reports the following allergies:  Allergies  Allergen Reactions   Other Itching    Reaction to unknown antibiotic at age 25   Penicillins Itching    Has patient had a PCN reaction causing immediate rash, facial/tongue/throat swelling, SOB or lightheadedness with hypotension: No Has patient had a PCN reaction causing severe rash involving mucus membranes or skin necrosis: No Has patient had a PCN reaction that required hospitalization: No Has patient had a PCN reaction occurring within the last 10 years: No If all of the above answers are NO, then may proceed with Cephalosporin use.     No orders of the defined types were placed in this encounter.   AUTHORIZATION INFORMATION Primary Insurance: 1D#: Group #:  Secondary Insurance: 1D#: Group #:  SCHEDULE INFORMATION: Date: 06/11/24 Time: Location: [facility]

## 2024-04-24 NOTE — Telephone Encounter (Unsigned)
 Copied from CRM [id number]. Topic: General - Other >> Apr 24, 2024  3:01 PM Zebedee SAUNDERS wrote: Reason for CRM: Pt was seen on 04/22/2024 by Sharyle Fischer and is still not well to return to work. Please provide an extension of absence and send via MyChart.

## 2024-04-25 ENCOUNTER — Encounter: Payer: Self-pay | Admitting: Internal Medicine

## 2024-04-25 NOTE — Telephone Encounter (Signed)
 Letter sent

## 2024-06-11 ENCOUNTER — Encounter: Payer: Self-pay | Admitting: Anesthesiology

## 2024-06-11 ENCOUNTER — Telehealth: Payer: Self-pay

## 2024-06-11 ENCOUNTER — Other Ambulatory Visit: Payer: Self-pay

## 2024-06-11 ENCOUNTER — Ambulatory Visit: Admission: RE | Admit: 2024-06-11 | Source: Home / Self Care | Admitting: Gastroenterology

## 2024-06-11 DIAGNOSIS — Z1211 Encounter for screening for malignant neoplasm of colon: Secondary | ICD-10-CM

## 2024-06-11 MED ORDER — KETOROLAC TROMETHAMINE 30 MG/ML IJ SOLN
INTRAMUSCULAR | Status: AC
  Filled 2024-06-11: qty 1

## 2024-06-11 MED ORDER — LIDOCAINE HCL (PF) 2 % IJ SOLN
INTRAMUSCULAR | Status: AC
  Filled 2024-06-11: qty 5

## 2024-06-11 MED ORDER — DEXMEDETOMIDINE HCL IN NACL 80 MCG/20ML IV SOLN
INTRAVENOUS | Status: AC
  Filled 2024-06-11: qty 40

## 2024-06-11 MED ORDER — ONDANSETRON HCL 4 MG/2ML IJ SOLN
INTRAMUSCULAR | Status: AC
  Filled 2024-06-11: qty 2

## 2024-06-11 MED ORDER — NA SULFATE-K SULFATE-MG SULF 17.5-3.13-1.6 GM/177ML PO SOLN: 354.0000 mL | Freq: Once | ORAL | 0 refills | Status: AC

## 2024-06-11 MED ORDER — PROPOFOL 1000 MG/100ML IV EMUL
INTRAVENOUS | Status: AC
  Filled 2024-06-11: qty 100

## 2024-06-11 NOTE — Telephone Encounter (Signed)
 Called patient back and she decided to reschedule her colonoscopy for 09/10/2024 at [facility] with Dr. Jinny. I will notify Lisa Torain.
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
## 2024-06-23 ENCOUNTER — Other Ambulatory Visit: Payer: Self-pay | Admitting: Nurse Practitioner

## 2024-06-23 DIAGNOSIS — I10 Essential (primary) hypertension: Secondary | ICD-10-CM

## 2024-06-24 NOTE — Telephone Encounter (Signed)
 Requested Prescriptions  Refused Prescriptions Disp Refills   hydrochlorothiazide  (HYDRODIURIL ) 25 MG tablet [Pharmacy Med Name: HYDROCHLOROTHIAZIDE  25MG  TABLETS] 90 tablet 1    Sig: TAKE 1 TABLET(25 MG) BY MOUTH DAILY     Cardiovascular: Diuretics - Thiazide Failed - 06/24/2024  4:59 PM      Failed - Cr in normal range and within 180 days    Creat  Date Value Ref Range Status  06/09/2023 0.72 0.50 - 0.99 mg/dL Final   Creatinine, Ser  Date Value Ref Range Status  11/17/2023 0.77 0.44 - 1.00 mg/dL Final   Creatinine, Urine  Date Value Ref Range Status  06/09/2023 184 20 - 275 mg/dL Final         Failed - K in normal range and within 180 days    Potassium  Date Value Ref Range Status  11/17/2023 3.5 3.5 - 5.1 mmol/L Final         Failed - Na in normal range and within 180 days    Sodium  Date Value Ref Range Status  11/17/2023 132 (L) 135 - 145 mmol/L Final         Passed - Last BP in normal range    BP Readings from Last 1 Encounters:  04/22/24 120/80         Passed - Valid encounter within last 6 months    Recent Outpatient Visits           2 months ago Type 2 diabetes mellitus with hyperglycemia, without long-term current use of insulin  [facility])   [facility] Bernardo Fend, DO   6 months ago Type 2 diabetes mellitus with hyperglycemia, without long-term current use of insulin  [facility])   [facility] Gareth Mliss FALCON, FNP

## 2024-09-10 ENCOUNTER — Ambulatory Visit: Admit: 2024-09-10 | Admitting: Gastroenterology

## 2024-09-10 SURGERY — COLONOSCOPY
Anesthesia: General
